# Patient Record
Sex: Female | Born: 1940 | Race: Black or African American | Hispanic: No | Smoking: Never smoker
Health system: Southern US, Community
[De-identification: ages and names within clinical notes are randomized; demographics above are authoritative.]

## PROBLEM LIST (undated history)

## (undated) DIAGNOSIS — F329 Major depressive disorder, single episode, unspecified: Secondary | ICD-10-CM

## (undated) DIAGNOSIS — I10 Essential (primary) hypertension: Secondary | ICD-10-CM

## (undated) DIAGNOSIS — K219 Gastro-esophageal reflux disease without esophagitis: Secondary | ICD-10-CM

## (undated) DIAGNOSIS — C569 Malignant neoplasm of unspecified ovary: Secondary | ICD-10-CM

## (undated) DIAGNOSIS — F32A Depression, unspecified: Secondary | ICD-10-CM

## (undated) DIAGNOSIS — Z9889 Other specified postprocedural states: Secondary | ICD-10-CM

## (undated) DIAGNOSIS — E785 Hyperlipidemia, unspecified: Secondary | ICD-10-CM

## (undated) DIAGNOSIS — Z8719 Personal history of other diseases of the digestive system: Secondary | ICD-10-CM

## (undated) DIAGNOSIS — F419 Anxiety disorder, unspecified: Secondary | ICD-10-CM

## (undated) HISTORY — DX: Anxiety disorder, unspecified: F41.9

## (undated) HISTORY — DX: Other specified postprocedural states: Z98.890

## (undated) HISTORY — DX: Major depressive disorder, single episode, unspecified: F32.9

## (undated) HISTORY — DX: Hyperlipidemia, unspecified: E78.5

## (undated) HISTORY — DX: Malignant neoplasm of unspecified ovary: C56.9

## (undated) HISTORY — PX: CHOLECYSTECTOMY: SHX55

## (undated) HISTORY — DX: Personal history of other diseases of the digestive system: Z87.19

## (undated) HISTORY — DX: Depression, unspecified: F32.A

## (undated) HISTORY — DX: Essential (primary) hypertension: I10

---

## 1961-03-23 HISTORY — PX: TUBAL LIGATION: SHX77

## 1978-03-23 HISTORY — PX: VAGINAL HYSTERECTOMY: SUR661

## 1997-05-31 ENCOUNTER — Ambulatory Visit (HOSPITAL_COMMUNITY): Admission: RE | Admit: 1997-05-31 | Discharge: 1997-05-31 | Payer: Self-pay | Admitting: *Deleted

## 1997-07-20 ENCOUNTER — Ambulatory Visit (HOSPITAL_COMMUNITY): Admission: RE | Admit: 1997-07-20 | Discharge: 1997-07-20 | Payer: Self-pay | Admitting: *Deleted

## 1997-11-16 ENCOUNTER — Ambulatory Visit (HOSPITAL_COMMUNITY): Admission: RE | Admit: 1997-11-16 | Discharge: 1997-11-16 | Payer: Self-pay | Admitting: Psychiatry

## 1997-12-20 ENCOUNTER — Ambulatory Visit (HOSPITAL_COMMUNITY): Admission: RE | Admit: 1997-12-20 | Discharge: 1997-12-20 | Payer: Self-pay | Admitting: Obstetrics and Gynecology

## 1997-12-20 ENCOUNTER — Encounter: Payer: Self-pay | Admitting: Obstetrics and Gynecology

## 1998-01-05 ENCOUNTER — Emergency Department (HOSPITAL_COMMUNITY): Admission: EM | Admit: 1998-01-05 | Discharge: 1998-01-05 | Payer: Self-pay | Admitting: Emergency Medicine

## 1998-01-05 ENCOUNTER — Encounter: Payer: Self-pay | Admitting: Emergency Medicine

## 1998-01-21 ENCOUNTER — Ambulatory Visit (HOSPITAL_COMMUNITY): Admission: RE | Admit: 1998-01-21 | Discharge: 1998-01-21 | Payer: Self-pay | Admitting: *Deleted

## 1998-05-09 ENCOUNTER — Emergency Department (HOSPITAL_COMMUNITY): Admission: EM | Admit: 1998-05-09 | Discharge: 1998-05-09 | Payer: Self-pay | Admitting: Emergency Medicine

## 1998-05-10 ENCOUNTER — Ambulatory Visit (HOSPITAL_COMMUNITY): Admission: RE | Admit: 1998-05-10 | Discharge: 1998-05-10 | Payer: Self-pay | Admitting: Psychiatry

## 1998-05-24 ENCOUNTER — Ambulatory Visit (HOSPITAL_COMMUNITY): Admission: RE | Admit: 1998-05-24 | Discharge: 1998-05-24 | Payer: Self-pay | Admitting: Psychiatry

## 1998-09-06 ENCOUNTER — Ambulatory Visit (HOSPITAL_COMMUNITY): Admission: RE | Admit: 1998-09-06 | Discharge: 1998-09-06 | Payer: Self-pay | Admitting: *Deleted

## 1998-12-23 ENCOUNTER — Encounter: Payer: Self-pay | Admitting: Family Medicine

## 1998-12-23 ENCOUNTER — Ambulatory Visit (HOSPITAL_COMMUNITY): Admission: RE | Admit: 1998-12-23 | Discharge: 1998-12-23 | Payer: Self-pay | Admitting: Family Medicine

## 1999-07-20 ENCOUNTER — Emergency Department (HOSPITAL_COMMUNITY): Admission: EM | Admit: 1999-07-20 | Discharge: 1999-07-20 | Payer: Self-pay | Admitting: Emergency Medicine

## 1999-07-20 ENCOUNTER — Encounter: Payer: Self-pay | Admitting: Emergency Medicine

## 1999-10-07 ENCOUNTER — Encounter: Payer: Self-pay | Admitting: Emergency Medicine

## 1999-10-08 ENCOUNTER — Inpatient Hospital Stay (HOSPITAL_COMMUNITY): Admission: EM | Admit: 1999-10-08 | Discharge: 1999-10-13 | Payer: Self-pay | Admitting: Emergency Medicine

## 1999-10-08 ENCOUNTER — Encounter: Payer: Self-pay | Admitting: Family Medicine

## 1999-12-12 ENCOUNTER — Ambulatory Visit (HOSPITAL_COMMUNITY): Admission: RE | Admit: 1999-12-12 | Discharge: 1999-12-12 | Payer: Self-pay | Admitting: Family Medicine

## 1999-12-12 ENCOUNTER — Encounter: Payer: Self-pay | Admitting: Family Medicine

## 2000-12-13 ENCOUNTER — Ambulatory Visit (HOSPITAL_COMMUNITY): Admission: RE | Admit: 2000-12-13 | Discharge: 2000-12-13 | Payer: Self-pay | Admitting: Family Medicine

## 2000-12-13 ENCOUNTER — Encounter: Payer: Self-pay | Admitting: Family Medicine

## 2001-04-14 ENCOUNTER — Emergency Department (HOSPITAL_COMMUNITY): Admission: EM | Admit: 2001-04-14 | Discharge: 2001-04-15 | Payer: Self-pay

## 2002-01-09 ENCOUNTER — Encounter: Payer: Self-pay | Admitting: Family Medicine

## 2002-01-09 ENCOUNTER — Ambulatory Visit (HOSPITAL_COMMUNITY): Admission: RE | Admit: 2002-01-09 | Discharge: 2002-01-09 | Payer: Self-pay | Admitting: Family Medicine

## 2002-09-11 ENCOUNTER — Inpatient Hospital Stay (HOSPITAL_COMMUNITY): Admission: EM | Admit: 2002-09-11 | Discharge: 2002-09-19 | Payer: Self-pay | Admitting: Emergency Medicine

## 2002-09-11 ENCOUNTER — Encounter: Payer: Self-pay | Admitting: Emergency Medicine

## 2002-09-15 ENCOUNTER — Encounter: Payer: Self-pay | Admitting: Family Medicine

## 2003-01-15 ENCOUNTER — Encounter: Payer: Self-pay | Admitting: Family Medicine

## 2003-01-15 ENCOUNTER — Ambulatory Visit (HOSPITAL_COMMUNITY): Admission: RE | Admit: 2003-01-15 | Discharge: 2003-01-15 | Payer: Self-pay | Admitting: Family Medicine

## 2003-11-26 ENCOUNTER — Emergency Department (HOSPITAL_COMMUNITY): Admission: EM | Admit: 2003-11-26 | Discharge: 2003-11-26 | Payer: Self-pay | Admitting: Emergency Medicine

## 2004-01-03 ENCOUNTER — Observation Stay (HOSPITAL_COMMUNITY): Admission: RE | Admit: 2004-01-03 | Discharge: 2004-01-04 | Payer: Self-pay | Admitting: General Surgery

## 2004-01-03 ENCOUNTER — Encounter (INDEPENDENT_AMBULATORY_CARE_PROVIDER_SITE_OTHER): Payer: Self-pay | Admitting: Specialist

## 2004-02-12 ENCOUNTER — Ambulatory Visit (HOSPITAL_COMMUNITY): Admission: RE | Admit: 2004-02-12 | Discharge: 2004-02-12 | Payer: Self-pay | Admitting: Obstetrics and Gynecology

## 2005-03-02 ENCOUNTER — Ambulatory Visit (HOSPITAL_COMMUNITY): Admission: RE | Admit: 2005-03-02 | Discharge: 2005-03-02 | Payer: Self-pay | Admitting: Family Medicine

## 2006-03-03 ENCOUNTER — Ambulatory Visit (HOSPITAL_COMMUNITY): Admission: RE | Admit: 2006-03-03 | Discharge: 2006-03-03 | Payer: Self-pay | Admitting: Family Medicine

## 2007-03-07 ENCOUNTER — Ambulatory Visit (HOSPITAL_COMMUNITY): Admission: RE | Admit: 2007-03-07 | Discharge: 2007-03-07 | Payer: Self-pay | Admitting: Family Medicine

## 2008-03-12 ENCOUNTER — Ambulatory Visit (HOSPITAL_COMMUNITY): Admission: RE | Admit: 2008-03-12 | Discharge: 2008-03-12 | Payer: Self-pay | Admitting: Family Medicine

## 2009-02-06 ENCOUNTER — Encounter: Admission: RE | Admit: 2009-02-06 | Discharge: 2009-02-06 | Payer: Self-pay | Admitting: Family Medicine

## 2009-03-20 ENCOUNTER — Ambulatory Visit: Admission: RE | Admit: 2009-03-20 | Discharge: 2009-03-20 | Payer: Self-pay | Admitting: Gynecologic Oncology

## 2009-04-01 ENCOUNTER — Inpatient Hospital Stay (HOSPITAL_COMMUNITY): Admission: RE | Admit: 2009-04-01 | Discharge: 2009-04-15 | Payer: Self-pay | Admitting: Radiation Oncology

## 2009-04-02 ENCOUNTER — Encounter: Payer: Self-pay | Admitting: Obstetrics & Gynecology

## 2009-04-02 DIAGNOSIS — C569 Malignant neoplasm of unspecified ovary: Secondary | ICD-10-CM

## 2009-04-02 HISTORY — DX: Malignant neoplasm of unspecified ovary: C56.9

## 2009-04-08 ENCOUNTER — Ambulatory Visit: Payer: Self-pay | Admitting: Oncology

## 2009-04-17 ENCOUNTER — Ambulatory Visit: Admission: RE | Admit: 2009-04-17 | Discharge: 2009-04-17 | Payer: Self-pay | Admitting: Gynecologic Oncology

## 2009-04-30 LAB — CBC WITH DIFFERENTIAL/PLATELET
Eosinophils Absolute: 0.3 10*3/uL (ref 0.0–0.5)
HCT: 31.3 % — ABNORMAL LOW (ref 34.8–46.6)
LYMPH%: 39.6 % (ref 14.0–49.7)
MCV: 82.8 fL (ref 79.5–101.0)
MONO#: 0.6 10*3/uL (ref 0.1–0.9)
MONO%: 6.8 % (ref 0.0–14.0)
NEUT#: 4.5 10*3/uL (ref 1.5–6.5)
NEUT%: 49.5 % (ref 38.4–76.8)
Platelets: 388 10*3/uL (ref 145–400)
RBC: 3.79 10*6/uL (ref 3.70–5.45)
WBC: 9.1 10*3/uL (ref 3.9–10.3)

## 2009-04-30 LAB — COMPREHENSIVE METABOLIC PANEL
ALT: 11 U/L (ref 0–35)
AST: 15 U/L (ref 0–37)
Albumin: 3.4 g/dL — ABNORMAL LOW (ref 3.5–5.2)
Alkaline Phosphatase: 40 U/L (ref 39–117)
Glucose, Bld: 87 mg/dL (ref 70–99)
Potassium: 4.1 mEq/L (ref 3.5–5.3)
Sodium: 136 mEq/L (ref 135–145)
Total Bilirubin: 0.4 mg/dL (ref 0.3–1.2)
Total Protein: 8.1 g/dL (ref 6.0–8.3)

## 2009-05-07 ENCOUNTER — Ambulatory Visit: Payer: Self-pay | Admitting: Oncology

## 2009-05-08 ENCOUNTER — Ambulatory Visit (HOSPITAL_COMMUNITY): Admission: RE | Admit: 2009-05-08 | Discharge: 2009-05-08 | Payer: Self-pay | Admitting: Oncology

## 2009-05-10 LAB — CBC WITH DIFFERENTIAL/PLATELET
BASO%: 0.2 % (ref 0.0–2.0)
HCT: 30.3 % — ABNORMAL LOW (ref 34.8–46.6)
LYMPH%: 38.3 % (ref 14.0–49.7)
MCHC: 32.7 g/dL (ref 31.5–36.0)
MCV: 83.2 fL (ref 79.5–101.0)
MONO#: 0.5 10*3/uL (ref 0.1–0.9)
MONO%: 5.7 % (ref 0.0–14.0)
NEUT%: 51.8 % (ref 38.4–76.8)
Platelets: 234 10*3/uL (ref 145–400)
RBC: 3.64 10*6/uL — ABNORMAL LOW (ref 3.70–5.45)

## 2009-05-17 LAB — CBC WITH DIFFERENTIAL/PLATELET
BASO%: 0.3 % (ref 0.0–2.0)
EOS%: 2.4 % (ref 0.0–7.0)
HCT: 30.3 % — ABNORMAL LOW (ref 34.8–46.6)
LYMPH%: 42.3 % (ref 14.0–49.7)
MCH: 27.2 pg (ref 25.1–34.0)
MCHC: 32.7 g/dL (ref 31.5–36.0)
MONO#: 0.3 10*3/uL (ref 0.1–0.9)
NEUT%: 50.8 % (ref 38.4–76.8)
Platelets: 238 10*3/uL (ref 145–400)
RBC: 3.64 10*6/uL — ABNORMAL LOW (ref 3.70–5.45)
WBC: 7.6 10*3/uL (ref 3.9–10.3)
lymph#: 3.2 10*3/uL (ref 0.9–3.3)
nRBC: 0 % (ref 0–0)

## 2009-05-24 LAB — CBC WITH DIFFERENTIAL/PLATELET
BASO%: 0.3 % (ref 0.0–2.0)
EOS%: 2 % (ref 0.0–7.0)
HCT: 28.8 % — ABNORMAL LOW (ref 34.8–46.6)
LYMPH%: 45.7 % (ref 14.0–49.7)
MCH: 27.2 pg (ref 25.1–34.0)
MCHC: 32.3 g/dL (ref 31.5–36.0)
MONO%: 7.9 % (ref 0.0–14.0)
NEUT%: 44.1 % (ref 38.4–76.8)
Platelets: 135 10*3/uL — ABNORMAL LOW (ref 145–400)

## 2009-05-29 LAB — IRON AND TIBC
%SAT: 21 % (ref 20–55)
UIBC: 183 ug/dL

## 2009-05-29 LAB — CBC WITH DIFFERENTIAL/PLATELET
Basophils Absolute: 0 10*3/uL (ref 0.0–0.1)
EOS%: 3 % (ref 0.0–7.0)
MCH: 29.2 pg (ref 25.1–34.0)
MCV: 84.8 fL (ref 79.5–101.0)
MONO%: 7.4 % (ref 0.0–14.0)
RBC: 3.07 10*6/uL — ABNORMAL LOW (ref 3.70–5.45)
RDW: 19.2 % — ABNORMAL HIGH (ref 11.2–14.5)

## 2009-05-29 LAB — COMPREHENSIVE METABOLIC PANEL
AST: 20 U/L (ref 0–37)
Albumin: 3.3 g/dL — ABNORMAL LOW (ref 3.5–5.2)
Alkaline Phosphatase: 47 U/L (ref 39–117)
BUN: 15 mg/dL (ref 6–23)
Potassium: 3.9 mEq/L (ref 3.5–5.3)
Sodium: 134 mEq/L — ABNORMAL LOW (ref 135–145)

## 2009-05-29 LAB — FERRITIN: Ferritin: 653 ng/mL — ABNORMAL HIGH (ref 10–291)

## 2009-06-05 ENCOUNTER — Ambulatory Visit: Payer: Self-pay | Admitting: Oncology

## 2009-06-07 LAB — CBC WITH DIFFERENTIAL/PLATELET
BASO%: 0.4 % (ref 0.0–2.0)
HCT: 26.8 % — ABNORMAL LOW (ref 34.8–46.6)
MCHC: 34.8 g/dL (ref 31.5–36.0)
MONO#: 0 10*3/uL — ABNORMAL LOW (ref 0.1–0.9)
NEUT%: 46.1 % (ref 38.4–76.8)
RBC: 3.13 10*6/uL — ABNORMAL LOW (ref 3.70–5.45)
RDW: 19.2 % — ABNORMAL HIGH (ref 11.2–14.5)
WBC: 3.5 10*3/uL — ABNORMAL LOW (ref 3.9–10.3)
lymph#: 1.8 10*3/uL (ref 0.9–3.3)

## 2009-06-14 LAB — CBC WITH DIFFERENTIAL/PLATELET
Basophils Absolute: 0 10*3/uL (ref 0.0–0.1)
Eosinophils Absolute: 0 10*3/uL (ref 0.0–0.5)
HCT: 25.9 % — ABNORMAL LOW (ref 34.8–46.6)
HGB: 8.5 g/dL — ABNORMAL LOW (ref 11.6–15.9)
MONO#: 0.5 10*3/uL (ref 0.1–0.9)
NEUT#: 0.6 10*3/uL — ABNORMAL LOW (ref 1.5–6.5)
NEUT%: 16.8 % — ABNORMAL LOW (ref 38.4–76.8)
RDW: 18.5 % — ABNORMAL HIGH (ref 11.2–14.5)
WBC: 3.7 10*3/uL — ABNORMAL LOW (ref 3.9–10.3)
lymph#: 2.5 10*3/uL (ref 0.9–3.3)

## 2009-06-17 LAB — CBC WITH DIFFERENTIAL/PLATELET
Basophils Absolute: 0 10*3/uL (ref 0.0–0.1)
EOS%: 0.2 % (ref 0.0–7.0)
Eosinophils Absolute: 0.1 10*3/uL (ref 0.0–0.5)
HCT: 26.5 % — ABNORMAL LOW (ref 34.8–46.6)
HGB: 8.8 g/dL — ABNORMAL LOW (ref 11.6–15.9)
MCH: 28.9 pg (ref 25.1–34.0)
MCV: 87.2 fL (ref 79.5–101.0)
MONO%: 6 % (ref 0.0–14.0)
NEUT#: 18.1 10*3/uL — ABNORMAL HIGH (ref 1.5–6.5)
NEUT%: 74.6 % (ref 38.4–76.8)
lymph#: 4.6 10*3/uL — ABNORMAL HIGH (ref 0.9–3.3)

## 2009-06-28 LAB — COMPREHENSIVE METABOLIC PANEL
Albumin: 4 g/dL (ref 3.5–5.2)
BUN: 29 mg/dL — ABNORMAL HIGH (ref 6–23)
CO2: 20 mEq/L (ref 19–32)
Calcium: 9.6 mg/dL (ref 8.4–10.5)
Chloride: 103 mEq/L (ref 96–112)
Creatinine, Ser: 0.88 mg/dL (ref 0.40–1.20)
Glucose, Bld: 184 mg/dL — ABNORMAL HIGH (ref 70–99)
Potassium: 4.7 mEq/L (ref 3.5–5.3)

## 2009-06-28 LAB — CBC WITH DIFFERENTIAL/PLATELET
Basophils Absolute: 0 10*3/uL (ref 0.0–0.1)
HCT: 27.7 % — ABNORMAL LOW (ref 34.8–46.6)
HGB: 9.3 g/dL — ABNORMAL LOW (ref 11.6–15.9)
LYMPH%: 13.2 % — ABNORMAL LOW (ref 14.0–49.7)
MCH: 29.3 pg (ref 25.1–34.0)
MONO#: 0 10*3/uL — ABNORMAL LOW (ref 0.1–0.9)
NEUT%: 86.1 % — ABNORMAL HIGH (ref 38.4–76.8)
Platelets: 444 10*3/uL — ABNORMAL HIGH (ref 145–400)
WBC: 7.2 10*3/uL (ref 3.9–10.3)
lymph#: 1 10*3/uL (ref 0.9–3.3)

## 2009-06-28 LAB — CA 125: CA 125: 121.2 U/mL — ABNORMAL HIGH (ref 0.0–30.2)

## 2009-07-05 ENCOUNTER — Ambulatory Visit: Payer: Self-pay | Admitting: Oncology

## 2009-07-09 LAB — CBC WITH DIFFERENTIAL/PLATELET
Eosinophils Absolute: 0 10*3/uL (ref 0.0–0.5)
MCV: 90.1 fL (ref 79.5–101.0)
MONO#: 1 10*3/uL — ABNORMAL HIGH (ref 0.1–0.9)
MONO%: 5.7 % (ref 0.0–14.0)
NEUT#: 12 10*3/uL — ABNORMAL HIGH (ref 1.5–6.5)
RBC: 2.93 10*6/uL — ABNORMAL LOW (ref 3.70–5.45)
RDW: 20.5 % — ABNORMAL HIGH (ref 11.2–14.5)
WBC: 16.7 10*3/uL — ABNORMAL HIGH (ref 3.9–10.3)
lymph#: 3.6 10*3/uL — ABNORMAL HIGH (ref 0.9–3.3)
nRBC: 0 % (ref 0–0)

## 2009-07-19 LAB — CBC WITH DIFFERENTIAL/PLATELET
BASO%: 0 % (ref 0.0–2.0)
HCT: 26.2 % — ABNORMAL LOW (ref 34.8–46.6)
MCHC: 33.2 g/dL (ref 31.5–36.0)
MONO#: 0.1 10*3/uL (ref 0.1–0.9)
NEUT#: 7.1 10*3/uL — ABNORMAL HIGH (ref 1.5–6.5)
NEUT%: 86.4 % — ABNORMAL HIGH (ref 38.4–76.8)
WBC: 8.2 10*3/uL (ref 3.9–10.3)
lymph#: 1.1 10*3/uL (ref 0.9–3.3)

## 2009-07-19 LAB — COMPREHENSIVE METABOLIC PANEL
ALT: 10 U/L (ref 0–35)
CO2: 19 mEq/L (ref 19–32)
Calcium: 9.5 mg/dL (ref 8.4–10.5)
Chloride: 103 mEq/L (ref 96–112)
Creatinine, Ser: 0.87 mg/dL (ref 0.40–1.20)
Sodium: 134 mEq/L — ABNORMAL LOW (ref 135–145)
Total Protein: 7.9 g/dL (ref 6.0–8.3)

## 2009-07-19 LAB — CA 125: CA 125: 100.7 U/mL — ABNORMAL HIGH (ref 0.0–30.2)

## 2009-08-05 ENCOUNTER — Ambulatory Visit: Payer: Self-pay | Admitting: Oncology

## 2009-08-05 ENCOUNTER — Encounter (HOSPITAL_COMMUNITY): Admission: RE | Admit: 2009-08-05 | Discharge: 2009-09-06 | Payer: Self-pay | Admitting: Oncology

## 2009-08-05 LAB — CBC WITH DIFFERENTIAL/PLATELET
Basophils Absolute: 0 10*3/uL (ref 0.0–0.1)
Eosinophils Absolute: 0.1 10*3/uL (ref 0.0–0.5)
HCT: 23.9 % — ABNORMAL LOW (ref 34.8–46.6)
HGB: 7.9 g/dL — ABNORMAL LOW (ref 11.6–15.9)
LYMPH%: 29.2 % (ref 14.0–49.7)
MCV: 95.2 fL (ref 79.5–101.0)
MONO#: 0.5 10*3/uL (ref 0.1–0.9)
MONO%: 6.5 % (ref 0.0–14.0)
NEUT#: 5.1 10*3/uL (ref 1.5–6.5)
NEUT%: 63.5 % (ref 38.4–76.8)
Platelets: 153 10*3/uL (ref 145–400)
WBC: 8.1 10*3/uL (ref 3.9–10.3)
nRBC: 0 % (ref 0–0)

## 2009-08-09 LAB — CBC WITH DIFFERENTIAL/PLATELET
Basophils Absolute: 0 10*3/uL (ref 0.0–0.1)
Eosinophils Absolute: 0 10*3/uL (ref 0.0–0.5)
HGB: 10.6 g/dL — ABNORMAL LOW (ref 11.6–15.9)
LYMPH%: 14.6 % (ref 14.0–49.7)
MCV: 92.2 fL (ref 79.5–101.0)
MONO#: 0 10*3/uL — ABNORMAL LOW (ref 0.1–0.9)
MONO%: 0.3 % (ref 0.0–14.0)
NEUT#: 5.2 10*3/uL (ref 1.5–6.5)
Platelets: 184 10*3/uL (ref 145–400)
RBC: 3.45 10*6/uL — ABNORMAL LOW (ref 3.70–5.45)
WBC: 6.1 10*3/uL (ref 3.9–10.3)

## 2009-08-09 LAB — FECAL OCCULT BLOOD, GUAIAC: Occult Blood: NEGATIVE

## 2009-08-26 LAB — CBC WITH DIFFERENTIAL/PLATELET
BASO%: 0.3 % (ref 0.0–2.0)
Eosinophils Absolute: 0.1 10*3/uL (ref 0.0–0.5)
LYMPH%: 21.8 % (ref 14.0–49.7)
MCHC: 34.9 g/dL (ref 31.5–36.0)
MONO#: 0.8 10*3/uL (ref 0.1–0.9)
MONO%: 7.1 % (ref 0.0–14.0)
NEUT#: 8.2 10*3/uL — ABNORMAL HIGH (ref 1.5–6.5)
RBC: 2.72 10*6/uL — ABNORMAL LOW (ref 3.70–5.45)
RDW: 21.3 % — ABNORMAL HIGH (ref 11.2–14.5)
WBC: 11.6 10*3/uL — ABNORMAL HIGH (ref 3.9–10.3)

## 2009-09-03 LAB — CBC WITH DIFFERENTIAL/PLATELET
BASO%: 0.1 % (ref 0.0–2.0)
Eosinophils Absolute: 0.3 10*3/uL (ref 0.0–0.5)
HCT: 27 % — ABNORMAL LOW (ref 34.8–46.6)
LYMPH%: 21.9 % (ref 14.0–49.7)
MCHC: 31.9 g/dL (ref 31.5–36.0)
MONO#: 0.6 10*3/uL (ref 0.1–0.9)
NEUT#: 5.3 10*3/uL (ref 1.5–6.5)
Platelets: 182 10*3/uL (ref 145–400)
RBC: 2.8 10*6/uL — ABNORMAL LOW (ref 3.70–5.45)
WBC: 8 10*3/uL (ref 3.9–10.3)
lymph#: 1.7 10*3/uL (ref 0.9–3.3)
nRBC: 0 % (ref 0–0)

## 2009-09-04 ENCOUNTER — Ambulatory Visit: Payer: Self-pay | Admitting: Oncology

## 2009-09-06 LAB — COMPREHENSIVE METABOLIC PANEL
ALT: 13 U/L (ref 0–35)
AST: 18 U/L (ref 0–37)
Albumin: 3.9 g/dL (ref 3.5–5.2)
CO2: 21 mEq/L (ref 19–32)
Calcium: 9.4 mg/dL (ref 8.4–10.5)
Chloride: 106 mEq/L (ref 96–112)
Potassium: 4.4 mEq/L (ref 3.5–5.3)
Total Protein: 7.6 g/dL (ref 6.0–8.3)

## 2009-09-06 LAB — CA 125: CA 125: 73 U/mL — ABNORMAL HIGH (ref 0.0–30.2)

## 2009-09-20 LAB — CBC WITH DIFFERENTIAL/PLATELET
BASO%: 0.3 % (ref 0.0–2.0)
LYMPH%: 22.5 % (ref 14.0–49.7)
MCHC: 34.4 g/dL (ref 31.5–36.0)
MCV: 95 fL (ref 79.5–101.0)
MONO%: 5.4 % (ref 0.0–14.0)
Platelets: 143 10*3/uL — ABNORMAL LOW (ref 145–400)
RBC: 2.85 10*6/uL — ABNORMAL LOW (ref 3.70–5.45)
RDW: 19.1 % — ABNORMAL HIGH (ref 11.2–14.5)
WBC: 7.9 10*3/uL (ref 3.9–10.3)

## 2009-09-24 ENCOUNTER — Ambulatory Visit (HOSPITAL_COMMUNITY): Admission: RE | Admit: 2009-09-24 | Discharge: 2009-09-24 | Payer: Self-pay | Admitting: Oncology

## 2009-09-26 ENCOUNTER — Ambulatory Visit: Admission: RE | Admit: 2009-09-26 | Discharge: 2009-09-26 | Payer: Self-pay | Admitting: Gynecologic Oncology

## 2009-10-04 ENCOUNTER — Ambulatory Visit: Payer: Self-pay | Admitting: Oncology

## 2009-10-04 LAB — CBC WITH DIFFERENTIAL/PLATELET
BASO%: 1.5 % (ref 0.0–2.0)
LYMPH%: 34.3 % (ref 14.0–49.7)
MCHC: 33.2 g/dL (ref 31.5–36.0)
MONO#: 0.5 10*3/uL (ref 0.1–0.9)
NEUT#: 3.5 10*3/uL (ref 1.5–6.5)
RBC: 3 10*6/uL — ABNORMAL LOW (ref 3.70–5.45)
RDW: 19.1 % — ABNORMAL HIGH (ref 11.2–14.5)
WBC: 6.5 10*3/uL (ref 3.9–10.3)
lymph#: 2.2 10*3/uL (ref 0.9–3.3)

## 2009-10-11 LAB — CBC & DIFF AND RETIC
Basophils Absolute: 0 10*3/uL (ref 0.0–0.1)
Eosinophils Absolute: 0 10*3/uL (ref 0.0–0.5)
HGB: 10 g/dL — ABNORMAL LOW (ref 11.6–15.9)
Immature Retic Fract: 7.3 % (ref 0.00–10.70)
MCV: 95.8 fL (ref 79.5–101.0)
MONO#: 0 10*3/uL — ABNORMAL LOW (ref 0.1–0.9)
NEUT#: 4.9 10*3/uL (ref 1.5–6.5)
RBC: 3.12 10*6/uL — ABNORMAL LOW (ref 3.70–5.45)
RDW: 16.2 % — ABNORMAL HIGH (ref 11.2–14.5)
Retic %: 1.55 % — ABNORMAL HIGH (ref 0.50–1.50)
Retic Ct Abs: 48.36 10*3/uL (ref 18.30–72.70)
WBC: 6.1 10*3/uL (ref 3.9–10.3)
lymph#: 1.2 10*3/uL (ref 0.9–3.3)
nRBC: 0 % (ref 0–0)

## 2009-10-16 LAB — COMPREHENSIVE METABOLIC PANEL
ALT: 9 U/L (ref 0–35)
Albumin: 4.3 g/dL (ref 3.5–5.2)
CO2: 19 mEq/L (ref 19–32)
Chloride: 103 mEq/L (ref 96–112)
Potassium: 4.1 mEq/L (ref 3.5–5.3)
Sodium: 135 mEq/L (ref 135–145)
Total Bilirubin: 0.3 mg/dL (ref 0.3–1.2)
Total Protein: 8.4 g/dL — ABNORMAL HIGH (ref 6.0–8.3)

## 2009-10-16 LAB — VITAMIN B12: Vitamin B-12: 888 pg/mL (ref 211–911)

## 2009-10-16 LAB — IRON AND TIBC: UIBC: 239 ug/dL

## 2009-10-16 LAB — FOLATE RBC: RBC Folate: 1011 ng/mL — ABNORMAL HIGH (ref 180–600)

## 2009-10-28 LAB — CBC WITH DIFFERENTIAL/PLATELET
Basophils Absolute: 0 10*3/uL (ref 0.0–0.1)
Eosinophils Absolute: 0.1 10*3/uL (ref 0.0–0.5)
HCT: 25.7 % — ABNORMAL LOW (ref 34.8–46.6)
LYMPH%: 23 % (ref 14.0–49.7)
MCV: 97.5 fL (ref 79.5–101.0)
MONO#: 0.5 10*3/uL (ref 0.1–0.9)
MONO%: 6 % (ref 0.0–14.0)
NEUT#: 6.3 10*3/uL (ref 1.5–6.5)
NEUT%: 69.9 % (ref 38.4–76.8)
Platelets: 177 10*3/uL (ref 145–400)
RBC: 2.64 10*6/uL — ABNORMAL LOW (ref 3.70–5.45)
WBC: 9 10*3/uL (ref 3.9–10.3)

## 2009-10-28 LAB — COMPREHENSIVE METABOLIC PANEL
Alkaline Phosphatase: 64 U/L (ref 39–117)
BUN: 25 mg/dL — ABNORMAL HIGH (ref 6–23)
CO2: 23 mEq/L (ref 19–32)
Creatinine, Ser: 0.84 mg/dL (ref 0.40–1.20)
Glucose, Bld: 98 mg/dL (ref 70–99)
Sodium: 140 mEq/L (ref 135–145)
Total Bilirubin: 0.2 mg/dL — ABNORMAL LOW (ref 0.3–1.2)
Total Protein: 7.5 g/dL (ref 6.0–8.3)

## 2009-10-28 LAB — LIPID PANEL
Cholesterol: 200 mg/dL (ref 0–200)
HDL: 41 mg/dL (ref >39)
LDL Cholesterol: 105 mg/dL — ABNORMAL HIGH (ref 0–99)
Triglycerides: 270 mg/dL — ABNORMAL HIGH (ref <150)
VLDL: 54 mg/dL — ABNORMAL HIGH (ref 0–40)

## 2009-11-04 ENCOUNTER — Ambulatory Visit (HOSPITAL_COMMUNITY): Admission: RE | Admit: 2009-11-04 | Discharge: 2009-11-04 | Payer: Self-pay | Admitting: Oncology

## 2009-11-06 ENCOUNTER — Ambulatory Visit: Payer: Self-pay | Admitting: Oncology

## 2009-11-08 LAB — CBC WITH DIFFERENTIAL/PLATELET
BASO%: 0.3 % (ref 0.0–2.0)
Basophils Absolute: 0 10*3/uL (ref 0.0–0.1)
EOS%: 0 % (ref 0.0–7.0)
HGB: 9.5 g/dL — ABNORMAL LOW (ref 11.6–15.9)
MCH: 32.5 pg (ref 25.1–34.0)
MCHC: 33.2 g/dL (ref 31.5–36.0)
MCV: 97.9 fL (ref 79.5–101.0)
MONO%: 0.5 % (ref 0.0–14.0)
NEUT%: 85.3 % — ABNORMAL HIGH (ref 38.4–76.8)
RDW: 15.7 % — ABNORMAL HIGH (ref 11.2–14.5)
lymph#: 1 10*3/uL (ref 0.9–3.3)

## 2009-11-26 LAB — CBC WITH DIFFERENTIAL/PLATELET
BASO%: 0.2 % (ref 0.0–2.0)
Basophils Absolute: 0 10*3/uL (ref 0.0–0.1)
EOS%: 1.2 % (ref 0.0–7.0)
Eosinophils Absolute: 0.1 10*3/uL (ref 0.0–0.5)
HCT: 26.7 % — ABNORMAL LOW (ref 34.8–46.6)
HGB: 8.6 g/dL — ABNORMAL LOW (ref 11.6–15.9)
LYMPH%: 28 % (ref 14.0–49.7)
MCH: 32.5 pg (ref 25.1–34.0)
MCHC: 32.2 g/dL (ref 31.5–36.0)
MCV: 100.8 fL (ref 79.5–101.0)
MONO#: 0.6 10*3/uL (ref 0.1–0.9)
MONO%: 7 % (ref 0.0–14.0)
NEUT#: 5.7 10*3/uL (ref 1.5–6.5)
NEUT%: 63.6 % (ref 38.4–76.8)
Platelets: 148 10*3/uL (ref 145–400)
RBC: 2.65 10*6/uL — ABNORMAL LOW (ref 3.70–5.45)
RDW: 15.5 % — ABNORMAL HIGH (ref 11.2–14.5)
WBC: 8.9 10*3/uL (ref 3.9–10.3)
lymph#: 2.5 10*3/uL (ref 0.9–3.3)
nRBC: 0 % (ref 0–0)

## 2009-12-06 ENCOUNTER — Ambulatory Visit: Payer: Self-pay | Admitting: Oncology

## 2009-12-10 LAB — CBC & DIFF AND RETIC
BASO%: 0.2 % (ref 0.0–2.0)
Basophils Absolute: 0 10*3/uL (ref 0.0–0.1)
EOS%: 3 % (ref 0.0–7.0)
HCT: 28 % — ABNORMAL LOW (ref 34.8–46.6)
HGB: 9.3 g/dL — ABNORMAL LOW (ref 11.6–15.9)
Immature Retic Fract: 8.2 % (ref 0.00–10.70)
LYMPH%: 28.4 % (ref 14.0–49.7)
MCH: 33.1 pg (ref 25.1–34.0)
MCHC: 33.2 g/dL (ref 31.5–36.0)
MONO#: 0.5 10*3/uL (ref 0.1–0.9)
NEUT%: 58.9 % (ref 38.4–76.8)
Platelets: 219 10*3/uL (ref 145–400)

## 2009-12-11 LAB — BASIC METABOLIC PANEL
CO2: 25 mEq/L (ref 19–32)
Calcium: 9.5 mg/dL (ref 8.4–10.5)
Chloride: 106 mEq/L (ref 96–112)
Glucose, Bld: 84 mg/dL (ref 70–99)
Sodium: 140 mEq/L (ref 135–145)

## 2010-01-07 ENCOUNTER — Ambulatory Visit: Payer: Self-pay | Admitting: Oncology

## 2010-01-09 ENCOUNTER — Ambulatory Visit: Admission: RE | Admit: 2010-01-09 | Discharge: 2010-01-09 | Payer: Self-pay | Admitting: Gynecologic Oncology

## 2010-01-09 LAB — CBC WITH DIFFERENTIAL/PLATELET
BASO%: 0.2 % (ref 0.0–2.0)
Basophils Absolute: 0 10*3/uL (ref 0.0–0.1)
EOS%: 3.4 % (ref 0.0–7.0)
Eosinophils Absolute: 0.2 10*3/uL (ref 0.0–0.5)
HCT: 31.8 % — ABNORMAL LOW (ref 34.8–46.6)
HGB: 10.5 g/dL — ABNORMAL LOW (ref 11.6–15.9)
LYMPH%: 36.9 % (ref 14.0–49.7)
MCH: 31.5 pg (ref 25.1–34.0)
MCHC: 33 g/dL (ref 31.5–36.0)
MCV: 95.5 fL (ref 79.5–101.0)
MONO#: 0.4 10*3/uL (ref 0.1–0.9)
MONO%: 6.8 % (ref 0.0–14.0)
NEUT#: 3.3 10*3/uL (ref 1.5–6.5)
NEUT%: 52.7 % (ref 38.4–76.8)
Platelets: 183 10*3/uL (ref 145–400)
RBC: 3.33 10*6/uL — ABNORMAL LOW (ref 3.70–5.45)
RDW: 13.1 % (ref 11.2–14.5)
WBC: 6.2 10*3/uL (ref 3.9–10.3)
lymph#: 2.3 10*3/uL (ref 0.9–3.3)
nRBC: 0 % (ref 0–0)

## 2010-01-24 LAB — CBC WITH DIFFERENTIAL/PLATELET
EOS%: 3.6 % (ref 0.0–7.0)
Eosinophils Absolute: 0.2 10*3/uL (ref 0.0–0.5)
LYMPH%: 36.6 % (ref 14.0–49.7)
MCH: 31.4 pg (ref 25.1–34.0)
MCHC: 33 g/dL (ref 31.5–36.0)
MCV: 95.2 fL (ref 79.5–101.0)
MONO%: 5.6 % (ref 0.0–14.0)
NEUT#: 3.2 10*3/uL (ref 1.5–6.5)
Platelets: 189 10*3/uL (ref 145–400)
RBC: 3.12 10*6/uL — ABNORMAL LOW (ref 3.70–5.45)
nRBC: 0 % (ref 0–0)

## 2010-01-24 LAB — COMPREHENSIVE METABOLIC PANEL
ALT: 11 U/L (ref 0–35)
CO2: 24 mEq/L (ref 19–32)
Creatinine, Ser: 1 mg/dL (ref 0.40–1.20)
Glucose, Bld: 116 mg/dL — ABNORMAL HIGH (ref 70–99)
Total Bilirubin: 0.3 mg/dL (ref 0.3–1.2)

## 2010-01-24 LAB — CA 125: CA 125: 42.7 U/mL — ABNORMAL HIGH (ref 0.0–30.2)

## 2010-03-19 ENCOUNTER — Ambulatory Visit: Payer: Self-pay | Admitting: Oncology

## 2010-03-21 LAB — COMPREHENSIVE METABOLIC PANEL
ALT: 15 U/L (ref 0–35)
AST: 18 U/L (ref 0–37)
Albumin: 3.9 g/dL (ref 3.5–5.2)
Alkaline Phosphatase: 47 U/L (ref 39–117)
Chloride: 104 mEq/L (ref 96–112)
Potassium: 4.8 mEq/L (ref 3.5–5.3)
Sodium: 138 mEq/L (ref 135–145)
Total Protein: 7.7 g/dL (ref 6.0–8.3)

## 2010-03-21 LAB — CBC WITH DIFFERENTIAL/PLATELET
BASO%: 0.2 % (ref 0.0–2.0)
EOS%: 2.7 % (ref 0.0–7.0)
MCH: 30.5 pg (ref 25.1–34.0)
MCV: 90.5 fL (ref 79.5–101.0)
MONO%: 7.6 % (ref 0.0–14.0)
RBC: 3.41 10*6/uL — ABNORMAL LOW (ref 3.70–5.45)
RDW: 14.1 % (ref 11.2–14.5)
lymph#: 2.3 10*3/uL (ref 0.9–3.3)

## 2010-03-23 ENCOUNTER — Emergency Department (HOSPITAL_COMMUNITY)
Admission: EM | Admit: 2010-03-23 | Discharge: 2010-03-23 | Payer: Self-pay | Source: Home / Self Care | Admitting: Emergency Medicine

## 2010-04-09 ENCOUNTER — Ambulatory Visit
Admission: RE | Admit: 2010-04-09 | Discharge: 2010-04-09 | Payer: Self-pay | Source: Home / Self Care | Attending: Gynecologic Oncology | Admitting: Gynecologic Oncology

## 2010-04-10 NOTE — Consult Note (Signed)
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  HISTORY OF PRESENT ILLNESS:  This is a 70 year old with remote history of vaginal hysterectomy and right salpingo-oophorectomy in 1988.  She presented with a pelvic mass in 2010 and on April 02, 2009, she underwent exploratory laparotomy, bilateral salpingo-oophorectomy, and omentectomy.  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  She received 6 cycles of adjuvant Taxol and carboplatin therapy and upon completion of therapy was noted to have an elevated CA-125.  Her administration of chemotherapy was complicated by significant neuropathy and anemia requiring several packed red blood cell transfusions.  In September 2010, she was noted to have minimal elevation in her CA-125 to 40.4.  At that time, she was completely asymptomatic and imaging was without any evidence of recurrent disease.  Given her poor tolerance of chemotherapy, plan was made for administration of tamoxifen.  On tamoxifen therapy, her CA-125 has remained stable.  Last value in December 2011 returned the value of 43, value on November 2011 was 42.7. At this visit, Ms. Olarte states that she is doing very well and she reports only complaints consistent with her arthritic condition.  There is no nausea, vomiting, no abdominal pain, bloating, changes in her appetite, hematuria, vaginal bleeding or hematochezia.  She denies any diarrhea or constipation.  States that she tolerated tamoxifen without any difficulty.  PAST MEDICAL HISTORY: 1. Hypertension. 2.  Hypercholesterolemia. 3. Paranoid schizophrenia. 4. Stage III C ovarian cancer.  PAST SURGICAL HISTORY: 1. Total vaginal hysterectomy and right salpingo-oophorectomy, 1988. 2. Bilateral salpingo-oophorectomy, omentectomy in 2011. 3. Cholecystectomy 2007. 4. Ganglion cyst removal from the left hand remotely. 5. Tubal ligation, 1963.  SOCIAL HISTORY:  The patient denies tobacco or alcohol use.  Her daughters are Jehovah's Witness.  REVIEW OF SYSTEMS:  Ten-point review of systems only notable as above.  PHYSICAL EXAMINATION:  GENERAL:  Well-developed thin female, in no acute distress. VITAL SIGNS:  Weight 192 pounds, blood pressure 124/64, pulse was 72. CHEST:  Clear to auscultation. HEART:  Regular rate and rhythm. BACK:  No CVA tenderness. LYMPH NODES:  No cervical, supraclavicular, or inguinal adenopathy. ABDOMEN:  Soft, obese, nontender.  No palpable masses. PELVIC:  Normal external genitalia, Bartholin, urethral and Skene, and atrophic vagina.  No nodularity within the cul-de-sac. RECTAL:  Good anal sphincter tone without any masses.  IMPRESSION:  Stage III C epithelial ovarian carcinoma, disease status is unclear and this difficult discussion was had with Ms. Demont.  She wondered whether her disease was in remission.  I informed her that there was no radiographic evidence of recurrence.  However, the CA-125 was minimally elevated and it appeared that the tamoxifen if it is having a cytotoxic  effect is successful in keeping the process stable. I am not entirely clear that she understood everything I was saying.  I have asked her to follow up with Dr. Darrold Span in February as previously scheduled.  I have also asked her to follow up with our service in 3 months.     Laurette Schimke, MD     WB/MEDQ  D:  04/09/2010  T:  04/09/2010  Job:  [id number]  cc:   Lennis P. Darrold Span, M.D. Fax: [phone]  Telford Nab, R.N. [address] [place], [place] [address]  Renaye Rakers, M.D. Fax: [phone]  Electronically Signed by Laurette Schimke MD on 04/10/2010 02:29:10 PM

## 2010-04-13 ENCOUNTER — Encounter: Payer: Self-pay | Admitting: Family Medicine

## 2010-05-16 ENCOUNTER — Other Ambulatory Visit: Payer: Self-pay | Admitting: Oncology

## 2010-05-16 ENCOUNTER — Encounter (HOSPITAL_BASED_OUTPATIENT_CLINIC_OR_DEPARTMENT_OTHER): Payer: MEDICARE | Admitting: Oncology

## 2010-05-16 DIAGNOSIS — C569 Malignant neoplasm of unspecified ovary: Secondary | ICD-10-CM

## 2010-05-16 DIAGNOSIS — Z5111 Encounter for antineoplastic chemotherapy: Secondary | ICD-10-CM

## 2010-05-16 DIAGNOSIS — D649 Anemia, unspecified: Secondary | ICD-10-CM

## 2010-05-16 DIAGNOSIS — Z5189 Encounter for other specified aftercare: Secondary | ICD-10-CM

## 2010-05-16 LAB — COMPREHENSIVE METABOLIC PANEL
ALT: 14 U/L (ref 0–35)
AST: 17 U/L (ref 0–37)
CO2: 25 mEq/L (ref 19–32)
Calcium: 9.6 mg/dL (ref 8.4–10.5)
Chloride: 104 mEq/L (ref 96–112)
Sodium: 137 mEq/L (ref 135–145)
Total Bilirubin: 0.3 mg/dL (ref 0.3–1.2)
Total Protein: 7.3 g/dL (ref 6.0–8.3)

## 2010-05-16 LAB — CBC WITH DIFFERENTIAL/PLATELET
Basophils Absolute: 0 10*3/uL (ref 0.0–0.1)
HCT: 30.7 % — ABNORMAL LOW (ref 34.8–46.6)
HGB: 10.3 g/dL — ABNORMAL LOW (ref 11.6–15.9)
MONO#: 0.5 10*3/uL (ref 0.1–0.9)
NEUT%: 50.6 % (ref 38.4–76.8)
WBC: 7.6 10*3/uL (ref 3.9–10.3)
lymph#: 3 10*3/uL (ref 0.9–3.3)

## 2010-05-16 LAB — CA 125: CA 125: 31.4 U/mL — ABNORMAL HIGH (ref 0.0–30.2)

## 2010-06-02 LAB — COMPREHENSIVE METABOLIC PANEL
ALT: 20 U/L (ref 0–35)
AST: 28 U/L (ref 0–37)
Albumin: 3.4 g/dL — ABNORMAL LOW (ref 3.5–5.2)
Alkaline Phosphatase: 46 U/L (ref 39–117)
CO2: 20 mEq/L (ref 19–32)
Chloride: 107 mEq/L (ref 96–112)
Creatinine, Ser: 1.23 mg/dL — ABNORMAL HIGH (ref 0.4–1.2)
GFR calc Af Amer: 52 mL/min — ABNORMAL LOW (ref >60)
GFR calc non Af Amer: 43 mL/min — ABNORMAL LOW (ref >60)
Potassium: 3.4 mEq/L — ABNORMAL LOW (ref 3.5–5.1)
Sodium: 135 mEq/L (ref 135–145)
Total Bilirubin: 0.4 mg/dL (ref 0.3–1.2)

## 2010-06-02 LAB — CBC
Hemoglobin: 10.7 g/dL — ABNORMAL LOW (ref 12.0–15.0)
MCH: 30.7 pg (ref 26.0–34.0)
Platelets: 175 10*3/uL (ref 150–400)
RBC: 3.48 MIL/uL — ABNORMAL LOW (ref 3.87–5.11)
WBC: 6.8 10*3/uL (ref 4.0–10.5)

## 2010-06-02 LAB — URINALYSIS, ROUTINE W REFLEX MICROSCOPIC
Bilirubin Urine: NEGATIVE
Hgb urine dipstick: NEGATIVE
Ketones, ur: NEGATIVE mg/dL
Nitrite: NEGATIVE
Specific Gravity, Urine: 1.02 (ref 1.005–1.030)
pH: 5.5 (ref 5.0–8.0)

## 2010-06-02 LAB — DIFFERENTIAL
Basophils Relative: 0 % (ref 0–1)
Eosinophils Relative: 1 % (ref 0–5)
Lymphs Abs: 2 10*3/uL (ref 0.7–4.0)
Monocytes Absolute: 0.4 10*3/uL (ref 0.1–1.0)

## 2010-06-02 LAB — URINE MICROSCOPIC-ADD ON

## 2010-06-08 LAB — CBC
HCT: 28.6 % — ABNORMAL LOW (ref 36.0–46.0)
HCT: 26.5 % — ABNORMAL LOW (ref 36.0–46.0)
HCT: 27.4 % — ABNORMAL LOW (ref 36.0–46.0)
HCT: 27.4 % — ABNORMAL LOW (ref 36.0–46.0)
Hemoglobin: 7 g/dL — ABNORMAL LOW (ref 12.0–15.0)
Hemoglobin: 9.1 g/dL — ABNORMAL LOW (ref 12.0–15.0)
Hemoglobin: 9.2 g/dL — ABNORMAL LOW (ref 12.0–15.0)
Hemoglobin: 9.5 g/dL — ABNORMAL LOW (ref 12.0–15.0)
Hemoglobin: 8.7 g/dL — ABNORMAL LOW (ref 12.0–15.0)
Hemoglobin: 8.9 g/dL — ABNORMAL LOW (ref 12.0–15.0)
Hemoglobin: 9 g/dL — ABNORMAL LOW (ref 12.0–15.0)
Hemoglobin: 9.4 g/dL — ABNORMAL LOW (ref 12.0–15.0)
MCHC: 32.6 g/dL (ref 30.0–36.0)
MCHC: 33.1 g/dL (ref 30.0–36.0)
MCHC: 33.2 g/dL (ref 30.0–36.0)
MCHC: 33.3 g/dL (ref 30.0–36.0)
MCHC: 32.7 g/dL (ref 30.0–36.0)
MCV: 80.5 fL (ref 78.0–100.0)
MCV: 82.2 fL (ref 78.0–100.0)
MCV: 83.2 fL (ref 78.0–100.0)
MCV: 82.1 fL (ref 78.0–100.0)
MCV: 82.3 fL (ref 78.0–100.0)
MCV: 82.9 fL (ref 78.0–100.0)
Platelets: 327 10*3/uL (ref 150–400)
Platelets: 376 10*3/uL (ref 150–400)
RBC: 2.66 MIL/uL — ABNORMAL LOW (ref 3.87–5.11)
RBC: 3.32 MIL/uL — ABNORMAL LOW (ref 3.87–5.11)
RBC: 3.36 MIL/uL — ABNORMAL LOW (ref 3.87–5.11)
RBC: 3.44 MIL/uL — ABNORMAL LOW (ref 3.87–5.11)
RBC: 3.19 MIL/uL — ABNORMAL LOW (ref 3.87–5.11)
RBC: 3.3 MIL/uL — ABNORMAL LOW (ref 3.87–5.11)
RBC: 3.32 MIL/uL — ABNORMAL LOW (ref 3.87–5.11)
RBC: 3.41 MIL/uL — ABNORMAL LOW (ref 3.87–5.11)
RDW: 18.1 % — ABNORMAL HIGH (ref 11.5–15.5)
RDW: 18.8 % — ABNORMAL HIGH (ref 11.5–15.5)
WBC: 7.5 10*3/uL (ref 4.0–10.5)
WBC: 8 10*3/uL (ref 4.0–10.5)
WBC: 10.8 10*3/uL — ABNORMAL HIGH (ref 4.0–10.5)
WBC: 6.7 10*3/uL (ref 4.0–10.5)
WBC: 7.6 10*3/uL (ref 4.0–10.5)

## 2010-06-08 LAB — BASIC METABOLIC PANEL
BUN: 12 mg/dL (ref 6–23)
CO2: 26 mEq/L (ref 19–32)
CO2: 26 mEq/L (ref 19–32)
CO2: 28 mEq/L (ref 19–32)
CO2: 25 mEq/L (ref 19–32)
CO2: 26 mEq/L (ref 19–32)
CO2: 26 mEq/L (ref 19–32)
Calcium: 7.9 mg/dL — ABNORMAL LOW (ref 8.4–10.5)
Calcium: 8.3 mg/dL — ABNORMAL LOW (ref 8.4–10.5)
Calcium: 8.6 mg/dL (ref 8.4–10.5)
Calcium: 8.6 mg/dL (ref 8.4–10.5)
Chloride: 103 mEq/L (ref 96–112)
Chloride: 105 mEq/L (ref 96–112)
Chloride: 106 mEq/L (ref 96–112)
Chloride: 105 mEq/L (ref 96–112)
Chloride: 105 mEq/L (ref 96–112)
Chloride: 108 mEq/L (ref 96–112)
Creatinine, Ser: 0.67 mg/dL (ref 0.4–1.2)
Creatinine, Ser: 0.7 mg/dL (ref 0.4–1.2)
GFR calc Af Amer: >60 mL/min (ref >60)
GFR calc Af Amer: >60 mL/min (ref >60)
GFR calc Af Amer: >60 mL/min (ref >60)
GFR calc Af Amer: >60 mL/min (ref >60)
GFR calc Af Amer: >60 mL/min (ref >60)
GFR calc Af Amer: >60 mL/min (ref >60)
GFR calc non Af Amer: >60 mL/min (ref >60)
GFR calc non Af Amer: >60 mL/min (ref >60)
Glucose, Bld: 102 mg/dL — ABNORMAL HIGH (ref 70–99)
Potassium: 4.4 mEq/L (ref 3.5–5.1)
Potassium: 3.8 mEq/L (ref 3.5–5.1)
Potassium: 3.8 mEq/L (ref 3.5–5.1)
Potassium: 4 mEq/L (ref 3.5–5.1)
Sodium: 134 mEq/L — ABNORMAL LOW (ref 135–145)
Sodium: 135 mEq/L (ref 135–145)
Sodium: 135 mEq/L (ref 135–145)
Sodium: 135 mEq/L (ref 135–145)
Sodium: 136 mEq/L (ref 135–145)
Sodium: 137 mEq/L (ref 135–145)

## 2010-06-08 LAB — GLUCOSE, CAPILLARY
Glucose-Capillary: 103 mg/dL — ABNORMAL HIGH (ref 70–99)
Glucose-Capillary: 112 mg/dL — ABNORMAL HIGH (ref 70–99)
Glucose-Capillary: 117 mg/dL — ABNORMAL HIGH (ref 70–99)
Glucose-Capillary: 128 mg/dL — ABNORMAL HIGH (ref 70–99)
Glucose-Capillary: 142 mg/dL — ABNORMAL HIGH (ref 70–99)
Glucose-Capillary: 101 mg/dL — ABNORMAL HIGH (ref 70–99)
Glucose-Capillary: 106 mg/dL — ABNORMAL HIGH (ref 70–99)
Glucose-Capillary: 107 mg/dL — ABNORMAL HIGH (ref 70–99)
Glucose-Capillary: 107 mg/dL — ABNORMAL HIGH (ref 70–99)
Glucose-Capillary: 113 mg/dL — ABNORMAL HIGH (ref 70–99)
Glucose-Capillary: 114 mg/dL — ABNORMAL HIGH (ref 70–99)
Glucose-Capillary: 117 mg/dL — ABNORMAL HIGH (ref 70–99)
Glucose-Capillary: 120 mg/dL — ABNORMAL HIGH (ref 70–99)
Glucose-Capillary: 121 mg/dL — ABNORMAL HIGH (ref 70–99)
Glucose-Capillary: 128 mg/dL — ABNORMAL HIGH (ref 70–99)
Glucose-Capillary: 128 mg/dL — ABNORMAL HIGH (ref 70–99)
Glucose-Capillary: 131 mg/dL — ABNORMAL HIGH (ref 70–99)
Glucose-Capillary: 91 mg/dL (ref 70–99)
Glucose-Capillary: 96 mg/dL (ref 70–99)

## 2010-06-08 LAB — COMPREHENSIVE METABOLIC PANEL
ALT: 8 U/L (ref 0–35)
ALT: 22 U/L (ref 0–35)
ALT: 35 U/L (ref 0–35)
AST: 14 U/L (ref 0–37)
AST: 29 U/L (ref 0–37)
Albumin: 1.5 g/dL — ABNORMAL LOW (ref 3.5–5.2)
Albumin: 1.7 g/dL — ABNORMAL LOW (ref 3.5–5.2)
Alkaline Phosphatase: 33 U/L — ABNORMAL LOW (ref 39–117)
Alkaline Phosphatase: 44 U/L (ref 39–117)
Alkaline Phosphatase: 55 U/L (ref 39–117)
BUN: 12 mg/dL (ref 6–23)
BUN: 13 mg/dL (ref 6–23)
CO2: 25 mEq/L (ref 19–32)
CO2: 27 mEq/L (ref 19–32)
Calcium: 8 mg/dL — ABNORMAL LOW (ref 8.4–10.5)
Calcium: 8.6 mg/dL (ref 8.4–10.5)
Chloride: 104 mEq/L (ref 96–112)
Chloride: 105 mEq/L (ref 96–112)
Creatinine, Ser: 0.76 mg/dL (ref 0.4–1.2)
GFR calc Af Amer: >60 mL/min (ref >60)
GFR calc non Af Amer: >60 mL/min (ref >60)
GFR calc non Af Amer: >60 mL/min (ref >60)
GFR calc non Af Amer: >60 mL/min (ref >60)
Glucose, Bld: 114 mg/dL — ABNORMAL HIGH (ref 70–99)
Glucose, Bld: 110 mg/dL — ABNORMAL HIGH (ref 70–99)
Potassium: 3.7 mEq/L (ref 3.5–5.1)
Potassium: 4.2 mEq/L (ref 3.5–5.1)
Sodium: 135 mEq/L (ref 135–145)
Sodium: 134 mEq/L — ABNORMAL LOW (ref 135–145)
Sodium: 134 mEq/L — ABNORMAL LOW (ref 135–145)
Total Bilirubin: 0.3 mg/dL (ref 0.3–1.2)
Total Bilirubin: 0.3 mg/dL (ref 0.3–1.2)
Total Protein: 5.4 g/dL — ABNORMAL LOW (ref 6.0–8.3)
Total Protein: 6.6 g/dL (ref 6.0–8.3)

## 2010-06-08 LAB — PHOSPHORUS
Phosphorus: 3.8 mg/dL (ref 2.3–4.6)
Phosphorus: 4 mg/dL (ref 2.3–4.6)

## 2010-06-08 LAB — DIFFERENTIAL
Basophils Absolute: 0 10*3/uL (ref 0.0–0.1)
Basophils Absolute: 0 10*3/uL (ref 0.0–0.1)
Basophils Absolute: 0.1 10*3/uL (ref 0.0–0.1)
Basophils Relative: 0 % (ref 0–1)
Basophils Relative: 1 % (ref 0–1)
Eosinophils Absolute: 0.2 10*3/uL (ref 0.0–0.7)
Eosinophils Absolute: 0.4 10*3/uL (ref 0.0–0.7)
Eosinophils Relative: 2 % (ref 0–5)
Lymphocytes Relative: 17 % (ref 12–46)
Monocytes Absolute: 0.7 10*3/uL (ref 0.1–1.0)
Monocytes Relative: 11 % (ref 3–12)
Monocytes Relative: 7 % (ref 3–12)
Neutro Abs: 4.7 10*3/uL (ref 1.7–7.7)
Neutro Abs: 6.6 10*3/uL (ref 1.7–7.7)
Neutrophils Relative %: 70 % (ref 43–77)
Neutrophils Relative %: 66 % (ref 43–77)
Neutrophils Relative %: 69 % (ref 43–77)

## 2010-06-08 LAB — CROSSMATCH
ABO/RH(D): O POS
Antibody Screen: NEGATIVE

## 2010-06-08 LAB — PREALBUMIN
Prealbumin: 7.4 mg/dL — ABNORMAL LOW (ref 18.0–45.0)
Prealbumin: 19.1 mg/dL (ref 18.0–45.0)
Prealbumin: 7.2 mg/dL — ABNORMAL LOW (ref 18.0–45.0)

## 2010-06-08 LAB — CLOSTRIDIUM DIFFICILE EIA
C difficile Toxins A+B, EIA: 22
C difficile Toxins A+B, EIA: NEGATIVE

## 2010-06-08 LAB — TYPE AND SCREEN

## 2010-06-08 LAB — MAGNESIUM
Magnesium: 1.8 mg/dL (ref 1.5–2.5)
Magnesium: 2 mg/dL (ref 1.5–2.5)

## 2010-06-08 LAB — TRIGLYCERIDES
Triglycerides: 145 mg/dL (ref <150)
Triglycerides: 176 mg/dL — ABNORMAL HIGH (ref <150)

## 2010-06-09 LAB — CROSSMATCH
ABO/RH(D): O POS
Antibody Screen: NEGATIVE

## 2010-06-23 LAB — COMPREHENSIVE METABOLIC PANEL
Albumin: 2.9 g/dL — ABNORMAL LOW (ref 3.5–5.2)
BUN: 19 mg/dL (ref 6–23)
Creatinine, Ser: 1.01 mg/dL (ref 0.4–1.2)
Total Protein: 8.7 g/dL — ABNORMAL HIGH (ref 6.0–8.3)

## 2010-06-23 LAB — DIFFERENTIAL
Basophils Absolute: 0 10*3/uL (ref 0.0–0.1)
Lymphocytes Relative: 25 % (ref 12–46)
Monocytes Absolute: 0.5 10*3/uL (ref 0.1–1.0)
Monocytes Relative: 7 % (ref 3–12)
Neutro Abs: 5 10*3/uL (ref 1.7–7.7)

## 2010-06-23 LAB — CBC
HCT: 29 % — ABNORMAL LOW (ref 36.0–46.0)
MCV: 78.9 fL (ref 78.0–100.0)
Platelets: 390 10*3/uL (ref 150–400)
RDW: 17.3 % — ABNORMAL HIGH (ref 11.5–15.5)

## 2010-06-23 LAB — ABO/RH: ABO/RH(D): O POS

## 2010-06-23 LAB — TYPE AND SCREEN

## 2010-07-03 ENCOUNTER — Ambulatory Visit: Payer: MEDICARE | Attending: Gynecologic Oncology | Admitting: Gynecologic Oncology

## 2010-07-03 ENCOUNTER — Encounter (HOSPITAL_BASED_OUTPATIENT_CLINIC_OR_DEPARTMENT_OTHER): Payer: MEDICARE | Admitting: Oncology

## 2010-07-03 ENCOUNTER — Other Ambulatory Visit: Payer: Self-pay | Admitting: Oncology

## 2010-07-03 DIAGNOSIS — Z90710 Acquired absence of both cervix and uterus: Secondary | ICD-10-CM | POA: Insufficient documentation

## 2010-07-03 DIAGNOSIS — Z9221 Personal history of antineoplastic chemotherapy: Secondary | ICD-10-CM | POA: Insufficient documentation

## 2010-07-03 DIAGNOSIS — Z452 Encounter for adjustment and management of vascular access device: Secondary | ICD-10-CM

## 2010-07-03 DIAGNOSIS — Z79899 Other long term (current) drug therapy: Secondary | ICD-10-CM | POA: Insufficient documentation

## 2010-07-03 DIAGNOSIS — C569 Malignant neoplasm of unspecified ovary: Secondary | ICD-10-CM | POA: Insufficient documentation

## 2010-07-03 DIAGNOSIS — Z9079 Acquired absence of other genital organ(s): Secondary | ICD-10-CM | POA: Insufficient documentation

## 2010-07-03 LAB — COMPREHENSIVE METABOLIC PANEL
ALT: 30 U/L (ref 0–35)
AST: 26 U/L (ref 0–37)
Alkaline Phosphatase: 44 U/L (ref 39–117)
Calcium: 9.5 mg/dL (ref 8.4–10.5)
Chloride: 104 mEq/L (ref 96–112)
Creatinine, Ser: 1 mg/dL (ref 0.40–1.20)
Potassium: 4.4 mEq/L (ref 3.5–5.3)

## 2010-07-03 LAB — CBC WITH DIFFERENTIAL/PLATELET
Basophils Absolute: 0 10*3/uL (ref 0.0–0.1)
EOS%: 3.7 % (ref 0.0–7.0)
HGB: 10.6 g/dL — ABNORMAL LOW (ref 11.6–15.9)
LYMPH%: 35.8 % (ref 14.0–49.7)
MCH: 28.6 pg (ref 25.1–34.0)
MCV: 87 fL (ref 79.5–101.0)
MONO%: 4.9 % (ref 0.0–14.0)
NEUT%: 55.4 % (ref 38.4–76.8)
Platelets: 218 10*3/uL (ref 145–400)
RDW: 14.2 % (ref 11.2–14.5)

## 2010-07-07 NOTE — Consult Note (Addendum)
  Jaclyn [name], Jaclyn [name]             ACCOUNT NO.:  [id number]  MEDICAL RECORD NO.:  [id number]           PATIENT TYPE:  LOCATION:                                 FACILITY:  PHYSICIAN:  Laurette Schimke, MD     DATE OF BIRTH:  07-27-1940  DATE OF CONSULTATION:  07/03/2010 DATE OF DISCHARGE:                                CONSULTATION   REASON FOR VISIT:  Surveillance for stage IIIC ovarian cancer.  HISTORY OF PRESENT ILLNESS:  This is a 70 year old with remote history of vaginal hysterectomy, right salpingo-oophorectomy, completed in 1988. Pelvic mass was noted in January 2011.  She underwent exploratory laparotomy, bilateral salpingo-oophorectomy, omentectomy.  Final pathology was consistent with stage IIIC seromucinous endometrioid adenocarcinoma.  She received 6 cycles of adjuvant Taxol and carboplatin therapy with persistent elevation in a CA-125.  Treatment was complicated by anemia, requiring multiple blood transfusions.  In September 2010, her CA-125 remained elevated minimally to 40.4.  Imaging was unremarkable and given her poor tolerance of chemotherapy, a trial of tamoxifen was undertaken.  Her CA-125 is at its nadir, as of May 16, 2010, is 66.  Jaclyn [name] feels very well.  PAST MEDICAL HISTORY:  No interval changes.  PAST SURGICAL HISTORY:  No interval changes.  MEDICATIONS:  No changes.  REVIEW OF SYSTEMS:  No nausea, vomiting, fever, chills.  Occasional nausea.  She reports diarrhea on 1 occasion since my last visit.  Her arthritis pain is persistent.  No rectal, vaginal bleeding.  No hematuria.  No abdominal bloating or changes in her appetite. Otherwise, 10-point review of systems is noncontributory.  PHYSICAL EXAMINATION:  VITAL SIGNS:  Weight 198 pounds.  Blood pressure 118/68, pulse of 76. CHEST:  Clear to auscultation. HEART:  Regular rate and rhythm. LYMPH NODE SURVEY:  No cervical, supraclavicular, or inguinal adenopathy. ABDOMEN:  Soft, obese  without any masses. PELVIC:  No nodularity noted within the vagina.  Good anal sphincter tone without any masses.  IMPRESSION:  Jaclyn [name] without any clinical evidence of disease from her stage IIIC ovarian cancer.  She is tolerating the tamoxifen well and has a scheduled followup with her ophthalmologist next week.  I think this strategy of Dr. Darrold Span was excellent.  It is improved the patient's quality of life with minimal toxicity and at this time she has no anxiety regarding her CA-125, but given the fact level is 31.4, I have asked Jaclyn [name] to follow up in 4 months.     Laurette Schimke, MD     WB/MEDQ  D:  07/03/2010  T:  07/04/2010  Job:  [id number]  cc:   Lennis P. Darrold Span, M.D. Fax: [phone]  Renaye Rakers, M.D. Fax: [phone]  Dr. Zoila Shutter  Telford Nab, RN MSN  Electronically Signed by Laurette Schimke MD on 07/07/2010 11:24:29 AM

## 2010-08-08 NOTE — Procedures (Signed)
[place]  Patient:    Jaclyn [name], Jaclyn [name]                    MRN: [id number] Proc. Date: 10/10/99 Adm. Date:  [id number] Attending:  Altamese  CC:         Cephas Darby. Daphine Deutscher, M.D.                           Procedure Report  PREOPERATIVE DIAGNOSIS:  Diarrhea, presumed infectious.  POSTOPERATIVE DIAGNOSIS: Grossly normal colonoscopic examination to cecum with gelatinous, greenish, thin material in the colon.  PROCEDURE:  Colonoscopy with biopsies and collections of stool.  MEDICATIONS:  Demerol 50 mg IV, Versed 5 mg IV over a ten minute period of time.  INSTRUMENT:  Olympus video pancolonoscope.  ENDOSCOPIST:  Sharyn Dross., M.D.  INFORMED CONSENT:  The patient was advised of procedure and indications and risks involved.  The patient has agreed to have the procedure performed.  PROCEDURE IN DETAIL:  The patient was brought in the endoscopy unit and IV sedating medication was used.  A monitor was placed on the patient to monitor the patients vital signs and oxygen saturation.  The nasal oxygen at 2 liters per minute was used and the procedure was begun.  No bowel preparation was given because of diarrhea at this time.  The instrument was advanced with the patient lying in the left lateral position to approximately 80 cm from the proximal colon to the proximal ascending colon that was noted.  There appeared to be evidence of watery to gelatinous-type stools that were present, greenish in color, that was noted throughout the colon.  Samples of this material were taken and sent for analysis and laboratory data.  Otherwise there was no evidence of any masses, polyps, strictures, or lesions appreciated.  The vascular pattern appeared to be within normal limits and the mucosal pattern showed no granular changes, no diverticular changes at this time.  Random biopsies were taken also for analysis to rule out any evidence of inflammatory  changes that could be present.  There was no evidence of internal nor external hemorrhoids noted.  RECOMMENDATIONS:  I would recommend awaiting the results of both the stool analysis, as well as the biopsy reports, at this time.  Continue conservative management during the interim.  If the patient is afebrile at this time, would also give a medication, such as Imodium or Lomotil, to help slow the diarrhea process present.  Depending upon the results of the cultures will determine the course of therapy. DD:  10/10/99 TD:  10/12/99 Job: [id number]

## 2010-08-08 NOTE — Consult Note (Signed)
[place]  Patient:    Jaclyn [name], Jaclyn [name]                    MRN: [id number] Proc. Date: 10/09/99 Adm. Date:  [id number] Attending:  Alwyn Pea D. Dictator:   [id number] CC:         Tanya D. Daphine Deutscher, M.D.                          Consultation Report  REASON FOR CONSULTATION: This pleasant 70 year old black female was referred for evaluation because of diarrhea and abdominal pain.  HISTORY OF PRESENT ILLNESS: The patient has been seen by me in the past and has been evaluated with a history of colon polyps.  The last colonoscopy was done in June 2000 and results were normal.  She has been relatively stable up until recently when she was admitted to the hospital with fever associated with nausea, vomiting, and diarrhea.  The patient states that the symptoms came on with right lower quadrant pain and discomfort, more aggravated with movement at that time.  She had some confusion noted also and thus there may have been some difficulties present.  She previously had been on a medication called Lotronex for irritable bowel syndrome that was present and this appeared to have kept her in check without any problems or complications, but since the medication has been discontinued the patient states that her GI symptoms have been more aggravated.  She presented to the emergency room and was evaluated by a physician at that time and based on that evaluation a lumbar spine film was also obtained to determine if evidence in association with fever or possible meningitis was present.  The results appeared to be unremarkable from that standpoint.  Consultation was obtained for evaluation of the patients symptoms of diarrhea, which is still presently ongoing at this time.  The patient states her stools are basically watery still at this time.  She denies any history of any blood or any melanotic stools present at this point. The vomiting has improved and she is able  to tolerate p.o. liquids and possibly solids, but at least the liquids we know.  She is not having any further difficulties with this at this time.  There is no history of any change in weight except there is a change in appetite that has been documented at this time.  PHYSICAL EXAMINATION:  GENERAL: She is a pleasant female who appears to be resting comfortably in bed at this time.  She is communicative, with slightly decreased affect noted.  HEENT: Sclerae anicteric.  NECK: Supple.  LUNGS: Clear to auscultation.  HEART: Regular rate and rhythm without heaves, thrills, murmurs, or gallops noted.  ABDOMEN: Soft.  Positive tenderness in the supraumbilical region as well as in the right lower quadrant region.  The abdomen is distended.  No tympany is presently noted.  EXTREMITIES: The extremities appear to be within normal limits.  LABORATORY DATA: WBC initially was 16.5 and presently is 9.3.  Hemoglobin 11.3, hematocrit 34.6; normal indices; normal platelet count; 85% neutrophils predominant at this time.  Electrolytes show a serum sodium initially of 130 at the time of admission but presently now 133.  Potassium 3.4, chloride 107 now, CO2 20, BUN 11, creatinine 1.  Total bilirubin 0.5, alkaline phosphatase 30.  Normal liver enzymes with SGOT and SGPT normal.  Albumin level is low at 2.8.  Calcium level is 8.5.  Serum spinal fluid tap shows colorless and clear fluid, 5 wbcs, 67 rbcs; too few to count segmented neutrophils.  Urinalysis on admission showed a specific gravity of 1.005, pH 6, trace blood; microscopic showed 0-5 rbc/hpf.  X-RAY STUDIES: Chest x-ray normal.  GROSS IMPRESSION: Fever with nausea and vomiting and diarrhea, possibly a gastroenteritis process that may be ongoing at this time.  The fact that she still has diarrhea makes me consider this still could be a process of irritable bowel syndrome versus some viral or infectious etiology that  is present.  RECOMMENDATIONS: I am presently going to schedule the patient for colonoscopic examination in the a.m., unprepped.  This will be to dramatic the level of the amount of diarrhea she does have as well as to help send for analysis at this point.  If evidence that the stools have changed to a more solid level and the patient is clinically stable would then recommend proceeding with conservative management from that standpoint and would just obtain routine culture and sensitivities on the patient.  Depending upon these results will determine the course of therapy.  Thank you again for allowing me to assist you in the management of her care. DD:  10/09/99 TD:  10/09/99 Job: [id number]

## 2010-08-08 NOTE — Discharge Summary (Signed)
NAME:  Jaclyn [name], Jaclyn [name]                       ACCOUNT NO.:  [id number]   MEDICAL RECORD NO.:  [id number]                   PATIENT TYPE:  INP   LOCATION:  0484                                 FACILITY:  [facility]   PHYSICIAN:  Renaye Rakers, M.D.                   DATE OF BIRTH:  17-Apr-1940   DATE OF ADMISSION:  09/11/2002  DATE OF DISCHARGE:                                 DISCHARGE SUMMARY   SUMMARY:  The patient is a 70 year old female known paranoid schizophrenic  on lithium who presented with a lithium overdose and hypokalemia.  Her  lithium dosage at the time of admission was 3.28 and her potassium was down  to 2.2 with a sodium of 132.  She also had nausea and vomiting during that  time, increase of BUN and creatinine, was found to be dehydrated.  She was  started on IV fluids with potassium supplementation and her lithium was  withheld.  Later her lithium level continued to improve.  Her potassium  level also slowly continued to come up and she had many runs of potassium in  order to try to get that potassium level up.  Concerning the patient's  mental status, at first she was lethargic and then she started to become  more alert.  It was noted that her gait was wobbly and we asked physical  therapy to work with her.  Initially the patient unfortunately fell also in  her own urine and this set [place] back by about three days because of soreness  but her x-rays were negative, but she complained of being extremely sore.  She was then able to get up and started to move around and was seen by  physical therapy, and they were able to walk her, but her gait from my point  of view seems quite wobbly and I feel that she needs additional physical  therapy at home.  Mentation-wise, she is alert and seems to be somewhat  confused still.  Her normal mentation is a pretty quiet person but now she  is a little bit more agitated, and I think this is because the lithium level  still is not straight  and it is going to take some additional tweaking to  get it in the right range.  Her family members include a daughter who is at  home all the time who has backed home to take care of her, but is working  and we are going to get home health care to come in, evaluate the situation,  and get a health aid to come in; also get physical therapy to come in and  work with her, and she will be seen by her psychiatrist as soon as possible.  The last lithium level was a little low and we are going to repeat it before  she goes home today, so I will not have that to put into this  report.  We  will continue her on her Protonix, her Zoloft.  We will continue her lithium  at 200 mg twice a day at this time, her Allegra 180, her estradiol, her  Diovan 80/12.5, and hyoscyamine 0.375 b.i.d., Abilify 20 mg q.h.s., and we  will have her seen in the office in a one-week period of time.   DISCHARGE DIAGNOSES:  She is discharged with a diagnosis of:  1. Nausea, vomiting, diarrhea.  2. Hypokalemia.  3. Dehydration.  4. Lithium toxicity.  5. Paranoid schizophrenia.  6. Irritable bowel syndrome.  7. Hypertension.  8. Depression.                                               Renaye Rakers, M.D.    VB/MEDQ  D:  09/19/2002  T:  09/19/2002  Job:  [id number]

## 2010-08-08 NOTE — Op Note (Signed)
Jaclyn [name], Jaclyn [name]             ACCOUNT NO.:  [id number]   MEDICAL RECORD NO.:  [id number]          PATIENT TYPE:  OBV   LOCATION:  0098                         FACILITY:  [facility]   PHYSICIAN:  Leonie Man, M.D.   DATE OF BIRTH:  05-20-1940   DATE OF PROCEDURE:  01/03/2004  DATE OF DISCHARGE:                                 OPERATIVE REPORT   PREOPERATIVE DIAGNOSIS:  Chronic cholecystitis.   POSTOPERATIVE DIAGNOSIS:  Acute-on-chronic cholecystitis.   PROCEDURE:  Laparoscopic cholecystectomy, intraoperative cholangiogram.   SURGEON:  Leonie Man, M.D.   ASSISTANT:  Joanne Gavel, M.D.   ANESTHESIA:  General.   NOTE:  The patient is a 70 year old female presenting with upper abdominal  pain, nausea and vomiting after being seen in the ER, and was noted on CT  scan to have cholelithiasis.  She comes to the operating room now after the  risks and potential benefits of surgery have been fully discussed, all  questions answered, and consent obtained.  The patient did have some mildly  elevated transaminases.  Bilirubin was within normal limits.   PROCEDURE:  Following the induction of satisfactory general anesthesia, the  patient was positioned supinely.  The abdomen was prepped and draped, to be  included in the sterile operative field.  An open laparoscopy was performed  through an umbilical wound and with the insertion of a Hasson cannula,  insufflation of the peritoneal cavity to 14 mmHg.  The camera was inserted.  Visual exploration of the abdomen showed an acutely inflamed gallbladder.  The liver edges were sharp.  The liver surface was smooth.  There were  multiple adhesions of the wall of the gallbladder noted.  The gallbladder  was quite hydropic.  None of the small or large intestines viewed appeared  to be abnormal.  There were multiple adhesions in the lower abdomen from  previous hysterectomy.   Under direct vision, epigastric and lateral ports are placed.  The  gallbladder is then grasped and aspirated, using a trocar aspirator, so as  to reduce the severe tension in the hydropic gallbladder.  Adhesions were  taken down from the gallbladder, and dissection carried down to the ampulla,  where the cystic artery and cystic ducts were isolated.  The cystic artery  was triply clipped and transected.  The cystic duct was clipped proximally  and opened.  I passed a Cook catheter into the abdomen and into the cystic  duct, where a cystic duct cholangiogram was carried out with one-half  strength Hypaque, the results of which showed a prompt flow of contrast into  the duodenum.  No filling defects.  There were normal-appearing hepatic  radicals.  The cholangiocatheter was removed, and the cystic duct doubly and  triply clipped and transected.  The gallbladder was then dissected free from  the liver bed using electrocautery and maintaining hemostasis throughout the  entire course of the dissection.  At the end of the dissection, the liver  bed was then thoroughly inspected, and additional bleeding points treated  with electrocautery.  The right upper quadrant was then thoroughly irrigated  with normal saline.  The gallbladder was placed in an EndoCatch and  retrieved through the umbilical wound without difficulty.  Sponge and  instrument counts were verified.  All fluid aspirated from the abdomen.  Pneumoperitoneum was then allowed to deflate.  The wounds were closed in  layers as follows:  The umbilical wound in two layers with 0 Vicryl and 4-0  Monocryl, epigastric and lateral flank wounds closed with 4-0  Monocryl sutures.  All wounds reinforced with Dermabond.  Sterile dressings  were then applied.  Anesthetic reversed.  Patient was moved from the  operating room to the recovery room in stable condition.  She tolerated the  procedure well.     Patr   PB/MEDQ  D:  01/03/2004  T:  01/03/2004  Job:  [id number]   cc:   Renaye Rakers, M.D.  [place] [address]  [address]  [place]  [place] [address]  Fax: [phone]

## 2010-08-08 NOTE — H&P (Signed)
[place]  Patient:    Jaclyn [name], Jaclyn [name]                    MRN: [id number] Adm. Date:  [id number] Disc. Date: [id number] Attending:  Alwyn Pea D.                         History and Physical  DATE OF BIRTH:  [birth year].  HISTORY OF PRESENT ILLNESS:  This is a 70 year old African-American female who presents to the emergency department with increased temperature x eight hours and a two-day history of diarrhea, abdominal pain, and vomiting.  The patient also has a decreased appetite.  She denies headache, dysuria, increased urgency or frequency.  The patient localizes the abdominal pain to the right lower quadrant and states that the pain is worse with movement.  She states that at is worst, the pain is a 4-5 out of 10 and is nonradiating.  She was confused at the time of initial presentation, and she also reports some fatigue and dizziness.  The patient does have a history of irritable bowel and has been noncompliant with her medicines.  There has been on change in appetite and no ill contacts.  Bowel movements are regular, with the last bowel movement being one day ago and was somewhat loose.  PAST MEDICAL HISTORY:  Significant for anxiety and inflammatory bowel disease.  MEDICATIONS:  Lithium 300 mg t.i.d., Lomotil, Aciphex 20 mg per day, _____ 1 mg q.h.s., Prevacid 30 mg per day, and Premarin 0.625 mg per day.  ALLERGIES:  SULFA, ATIVAN.  REVIEW OF SYSTEMS:  No headache, no neck stiffness, no hematochezia, no melena, no hematemesis, and all else negative except for HPI.  FAMILY HISTORY:  Noncontributory.  PHYSICAL EXAMINATION:  GENERAL:  In general, she is alert and oriented, in no acute distress.  VITAL SIGNS:  Temperature 39.6 T-max, current temperature 37.1, blood pressure 145/70, heart rate 100, respiratory rate 20, O2 saturations 96% on room air.  HEENT:  Mucous membranes moist.  Pupils are equal, round and reactive  to light.  Extraocular muscles intact.  NECK:  supple without adenopathy, thyromegaly, JVD, or bruits.  LUNGS:  Clear to auscultation bilaterally.  ABDOMEN:  Soft with right lower quadrant tenderness to palpation.  There is no rebound or guarding.  Normoactive bowel sounds, no masses.  There is a questionable amount of tenderness over McBurneys point, but the psoas sign is negative.  EXTREMITIES:  Without clubbing, cyanosis, or edema.  GENITOURINARY:  Foley catheter is in place.  BACK:  No CVA tenderness to palpation.  NEUROLOGIC:  Nonfocal.  RECTAL:  Hemoccult-negative.  No tenderness.  LABORATORY DATA:  Catheterized urinalysis reveals a pH of 6, a specific gravity of less than 1.005, trace blood, negative nitrites, negative leukocyte, few bacteria.  ABG 7.469 with a PCO2 of 42 and a bicarbonate of 16. White count 16.5, hemoglobin 11.1, hematocrit 32.3, platelets 295, 90 neutrophils, 6 lymphs, 3 monos.  Sodium 130, potassium 3.1, chloride 98, CO2 19, BUN 11, creatinine 1.2, glucose 146.  Bilirubin 0.8, SGOT 32, alkaline phosphatase 35, SGPT 14, total protein 7.4, albumin 3.2, calcium 9.3.  CSF is negative for organisms on Gram stain, total protein 49, glucose 92.  Lithium level is 0.86.  ASSESSMENT:  A 70 year old with a history of inflammatory bowel disease, now with increased temperature, diarrhea, vomiting, abdominal pain of questionable etiology.  Will check a CT scan  to rule out appendicitis and consult general surgery versus GI consult depending on the findings.  Will recheck CBC and CMP.  Will start PPI IV, will add Flagyl to her Rocephin, which was initiated in the emergency department, and will resume her home medications. DD:  10/21/99 TD:  10/22/99 Job: [id number]

## 2010-08-29 ENCOUNTER — Other Ambulatory Visit: Payer: Self-pay | Admitting: Oncology

## 2010-08-29 ENCOUNTER — Encounter (HOSPITAL_BASED_OUTPATIENT_CLINIC_OR_DEPARTMENT_OTHER): Payer: Medicare Other | Admitting: Oncology

## 2010-08-29 DIAGNOSIS — Z452 Encounter for adjustment and management of vascular access device: Secondary | ICD-10-CM

## 2010-08-29 DIAGNOSIS — C569 Malignant neoplasm of unspecified ovary: Secondary | ICD-10-CM

## 2010-08-29 LAB — CBC WITH DIFFERENTIAL/PLATELET
BASO%: 0.3 % (ref 0.0–2.0)
EOS%: 2.4 % (ref 0.0–7.0)
LYMPH%: 32.2 % (ref 14.0–49.7)
MCH: 29.4 pg (ref 25.1–34.0)
MCHC: 33.7 g/dL (ref 31.5–36.0)
MCV: 87.4 fL (ref 79.5–101.0)
MONO%: 7 % (ref 0.0–14.0)
NEUT%: 58.1 % (ref 38.4–76.8)
Platelets: 212 10*3/uL (ref 145–400)
RBC: 3.47 10*6/uL — ABNORMAL LOW (ref 3.70–5.45)
WBC: 7.3 10*3/uL (ref 3.9–10.3)

## 2010-08-29 LAB — CA 125: CA 125: 23.2 U/mL (ref 0.0–30.2)

## 2010-10-06 ENCOUNTER — Other Ambulatory Visit (HOSPITAL_COMMUNITY): Payer: Self-pay | Admitting: Family Medicine

## 2010-10-06 DIAGNOSIS — Z1231 Encounter for screening mammogram for malignant neoplasm of breast: Secondary | ICD-10-CM

## 2010-10-24 ENCOUNTER — Ambulatory Visit (HOSPITAL_BASED_OUTPATIENT_CLINIC_OR_DEPARTMENT_OTHER): Payer: Medicare Other | Admitting: Oncology

## 2010-10-24 ENCOUNTER — Other Ambulatory Visit: Payer: Self-pay | Admitting: Oncology

## 2010-10-24 DIAGNOSIS — D649 Anemia, unspecified: Secondary | ICD-10-CM

## 2010-10-24 DIAGNOSIS — C569 Malignant neoplasm of unspecified ovary: Secondary | ICD-10-CM

## 2010-10-24 LAB — CBC WITH DIFFERENTIAL/PLATELET
BASO%: 0.3 % (ref 0.0–2.0)
EOS%: 3.1 % (ref 0.0–7.0)
MCH: 29.7 pg (ref 25.1–34.0)
MCHC: 33.8 g/dL (ref 31.5–36.0)
MCV: 87.9 fL (ref 79.5–101.0)
MONO%: 6 % (ref 0.0–14.0)
NEUT%: 61.7 % (ref 38.4–76.8)
RDW: 14.7 % — ABNORMAL HIGH (ref 11.2–14.5)
lymph#: 2.2 10*3/uL (ref 0.9–3.3)

## 2010-10-24 LAB — COMPREHENSIVE METABOLIC PANEL
AST: 21 U/L (ref 0–37)
Albumin: 3.9 g/dL (ref 3.5–5.2)
Alkaline Phosphatase: 40 U/L (ref 39–117)
BUN: 20 mg/dL (ref 6–23)
Glucose, Bld: 95 mg/dL (ref 70–99)
Potassium: 4.3 mEq/L (ref 3.5–5.3)
Sodium: 137 mEq/L (ref 135–145)
Total Bilirubin: 0.2 mg/dL — ABNORMAL LOW (ref 0.3–1.2)

## 2010-11-06 ENCOUNTER — Ambulatory Visit: Payer: Medicare Other | Attending: Gynecologic Oncology | Admitting: Gynecologic Oncology

## 2010-11-06 DIAGNOSIS — Z79899 Other long term (current) drug therapy: Secondary | ICD-10-CM | POA: Insufficient documentation

## 2010-11-06 DIAGNOSIS — Z9079 Acquired absence of other genital organ(s): Secondary | ICD-10-CM | POA: Insufficient documentation

## 2010-11-06 DIAGNOSIS — Z90710 Acquired absence of both cervix and uterus: Secondary | ICD-10-CM | POA: Insufficient documentation

## 2010-11-06 DIAGNOSIS — C569 Malignant neoplasm of unspecified ovary: Secondary | ICD-10-CM | POA: Insufficient documentation

## 2010-11-11 NOTE — Consult Note (Signed)
  NAMEEMILIE, CARP NO.:  [id number]  MEDICAL RECORD NO.:  [id number]  LOCATION:  GYN                          FACILITY:  [facility]  PHYSICIAN:  Laurette Schimke, MD     DATE OF BIRTH:  08/27/1940  DATE OF CONSULTATION:  11/06/2010 DATE OF DISCHARGE:                                CONSULTATION   REASON FOR VISIT:  Surveillance for stage 3C ovarian cancer.  HISTORY OF PRESENT ILLNESS:  This is a 70 year old with remote history of a vaginal hysterectomy and right salpingo-oophorectomy completed in 1988, pelvic mass was noted in January 2012.  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  She received 6 cycles of Taxol and carboplatin therapy with persistent elevations in her CA-125.  Treatment was complicated by anemia and requirement for multiple blood transfusions. Her CA-125 in September 2011, remained minimally elevated to 40.  On February 24, a trial with tamoxifen was undertaken given her poor tolerance of chemotherapy.  Her CA-125 nadir was 20, most recent value returned with a value of 23.  PAST MEDICAL HISTORY:  No interval changes.  PAST SURGICAL HISTORY:  No interval changes.  REVIEW OF SYSTEMS:  CONSTITUTIONAL:  No nausea, vomiting, fever, chills, headache, shortness of breath, or adenopathy.  No abdominal bloating, anorexia and change in appetite, weight loss.  No bloating.  GI:  No diarrhea, constipation, hematuria, hematochezia, and hemat occult. EXTREMITIES:  No swelling of the lower extremities.  Otherwise, 10-point review of systems is negative.  PHYSICAL EXAMINATION:  GENERAL:  Well-developed female in no acute distress. CHEST:  Clear to auscultation. HEART:  Regular rate and rhythm. ABDOMEN:  Soft, nontender, and obese.  No palpable masses. PELVIC EXAMINATION:  Normal external genitalia;  Bartholin's, urethral, and Skene's.  No masses in the vagina, cul-de-sac, or pelvis. EXTREMITIES:  No clubbing, cyanosis, or edema.  IMPRESSION:  Ms. Wheless is a 70 year old with stage 3C ovarian cancer, currently on tamoxifen with reduction and stabilization of her CA-125 in the 20s.  Recent eye examination in July 2012, was notable for small cataracts, too small to be addressed.  Otherwise, she appears to be doing very well.  She will see Dr Darrold Span as scheduled in November and return to GYN Oncology in February 2013.-     Laurette Schimke, MD  cc Telford Nab, RN    Jama Flavors, MD    Ocie Bob, MD   WB/MEDQ  D:  11/06/2010  T:  11/06/2010  Job:  [id number]  Electronically Signed by Laurette Schimke MD on 11/11/2010 07:01:53 AM

## 2010-11-17 ENCOUNTER — Ambulatory Visit (HOSPITAL_COMMUNITY)
Admission: RE | Admit: 2010-11-17 | Discharge: 2010-11-17 | Disposition: A | Discharge status: Home / Self Care | Payer: Medicare Other | Source: Ambulatory Visit | Attending: Family Medicine | Admitting: Family Medicine

## 2010-11-17 DIAGNOSIS — Z1231 Encounter for screening mammogram for malignant neoplasm of breast: Secondary | ICD-10-CM | POA: Insufficient documentation

## 2010-12-11 IMAGING — CR DG ABDOMEN 2V
2 series · 2 of 2 positions shown · non-contrast
Comparison: CT dated 02/06/2009

CLINICAL DATA: Abdominal distention and pelvic mass.

ABDOMEN - 2 VIEW

[view not recorded (1 of 2)]
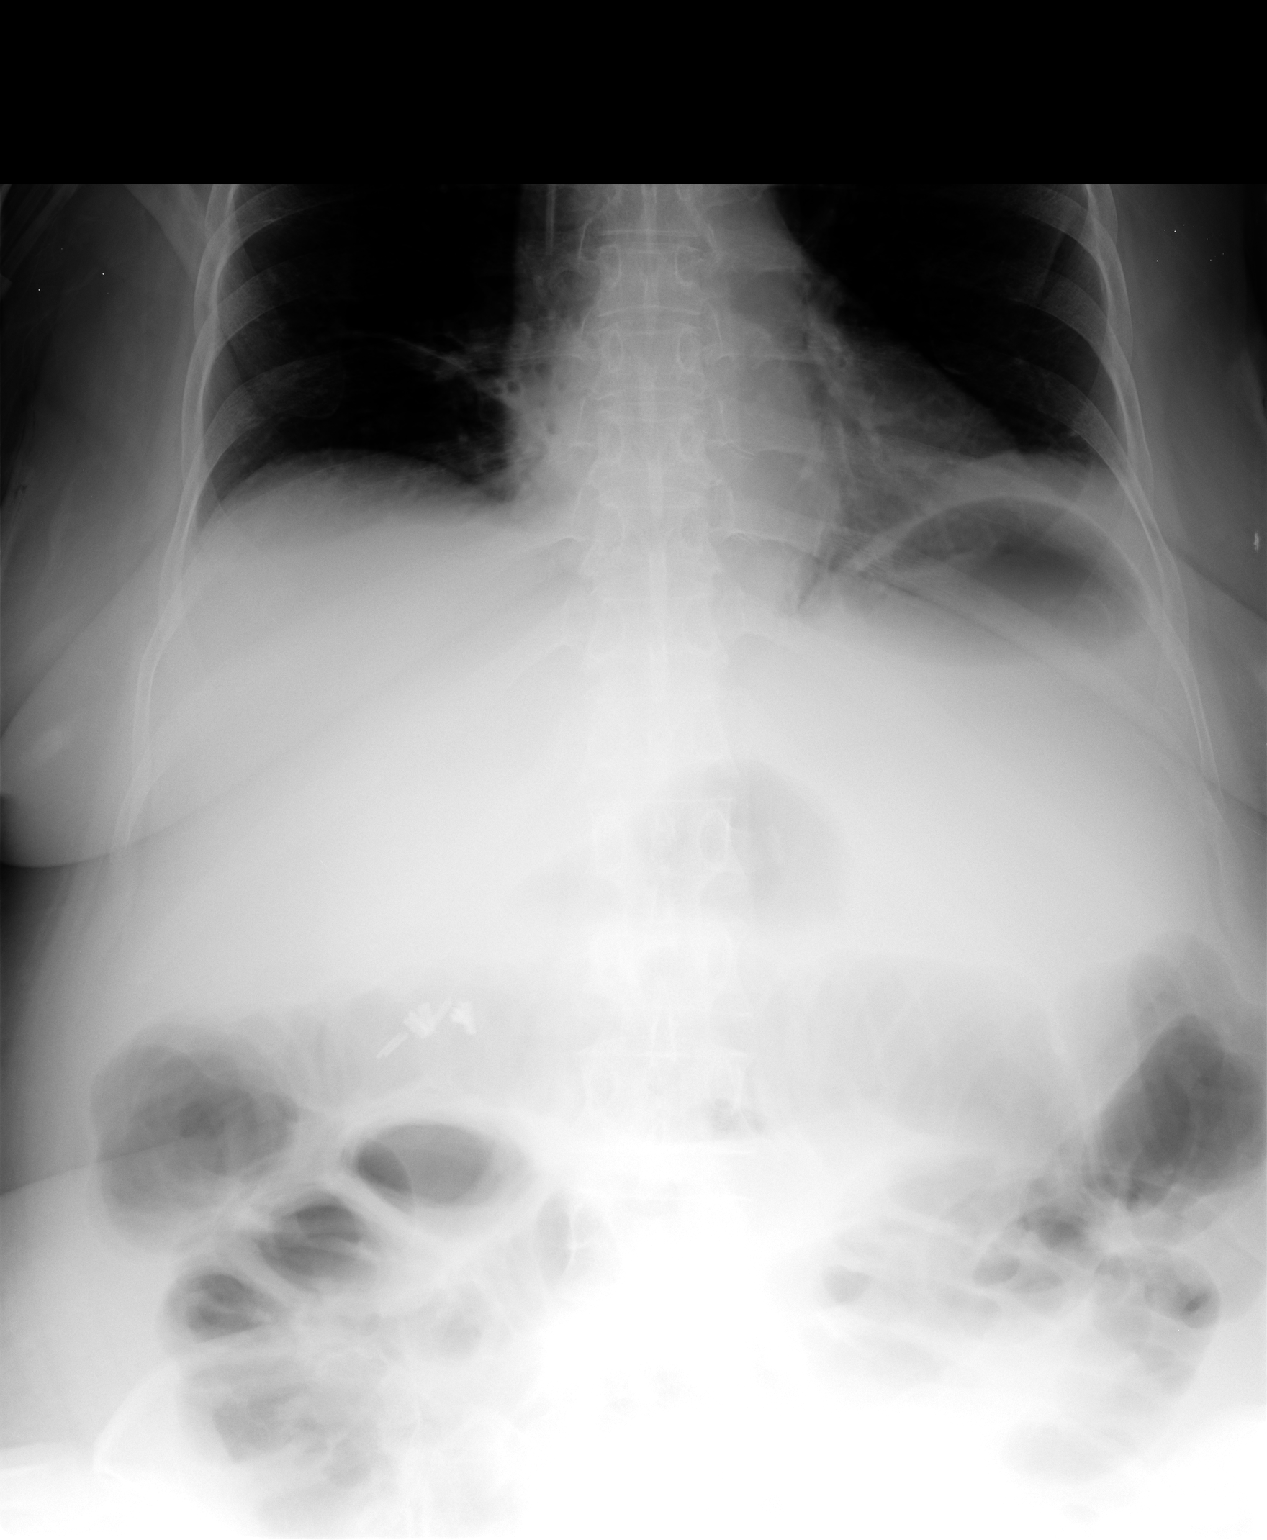

[view not recorded (2 of 2)]
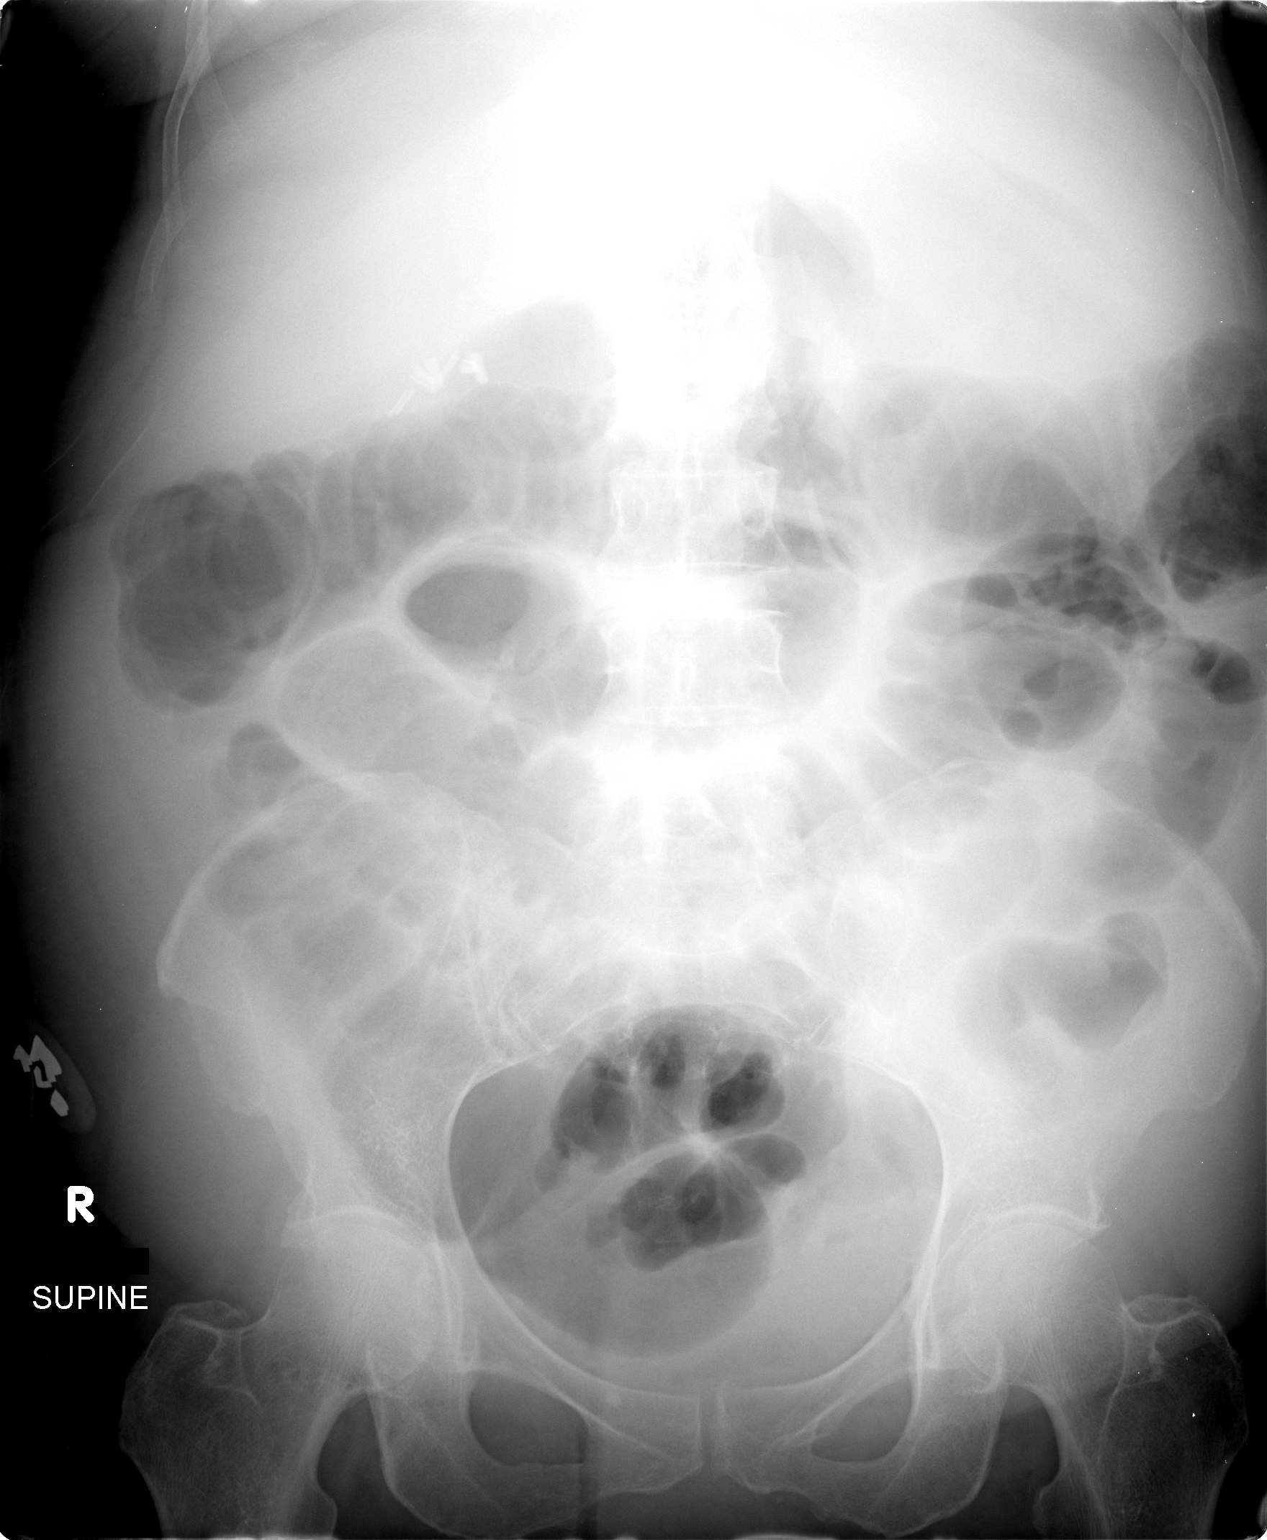

[2 of 2 positions shown; findings below may reference images not displayed]

FINDINGS: Upright and supine views of the abdomen were obtained.
There are surgical clips in the right upper quadrant of the
abdomen.  There are gas filled dilated loops of bowel, some which
could represent small bowel.  No clear evidence for free air.  Lung
bases are difficult to evaluate and there may be right pleural
fluid.  The patient has a central venous catheter that terminates
in the SVC.
IMPRESSION: Dilated loops of small bowel concerning for an obstructive process.

## 2010-12-19 ENCOUNTER — Other Ambulatory Visit: Payer: Self-pay | Admitting: Oncology

## 2010-12-19 ENCOUNTER — Encounter (HOSPITAL_BASED_OUTPATIENT_CLINIC_OR_DEPARTMENT_OTHER): Payer: Medicare Other | Admitting: Oncology

## 2010-12-19 DIAGNOSIS — Z452 Encounter for adjustment and management of vascular access device: Secondary | ICD-10-CM

## 2010-12-19 DIAGNOSIS — C569 Malignant neoplasm of unspecified ovary: Secondary | ICD-10-CM

## 2010-12-19 LAB — COMPREHENSIVE METABOLIC PANEL
AST: 22 U/L (ref 0–37)
Albumin: 3.8 g/dL (ref 3.5–5.2)
Alkaline Phosphatase: 41 U/L (ref 39–117)
BUN: 16 mg/dL (ref 6–23)
Creatinine, Ser: 0.95 mg/dL (ref 0.50–1.10)
Glucose, Bld: 82 mg/dL (ref 70–99)
Total Bilirubin: 0.2 mg/dL — ABNORMAL LOW (ref 0.3–1.2)

## 2010-12-19 LAB — CBC WITH DIFFERENTIAL/PLATELET
Basophils Absolute: 0 10*3/uL (ref 0.0–0.1)
EOS%: 3.6 % (ref 0.0–7.0)
Eosinophils Absolute: 0.3 10*3/uL (ref 0.0–0.5)
HGB: 10.5 g/dL — ABNORMAL LOW (ref 11.6–15.9)
LYMPH%: 31.4 % (ref 14.0–49.7)
MCH: 29.5 pg (ref 25.1–34.0)
MCV: 87.3 fL (ref 79.5–101.0)
MONO%: 5.5 % (ref 0.0–14.0)
NEUT#: 4.3 10*3/uL (ref 1.5–6.5)
NEUT%: 59.2 % (ref 38.4–76.8)
Platelets: 238 10*3/uL (ref 145–400)
RDW: 14.8 % — ABNORMAL HIGH (ref 11.2–14.5)

## 2011-01-10 ENCOUNTER — Other Ambulatory Visit: Payer: Self-pay | Admitting: Oncology

## 2011-01-10 DIAGNOSIS — C569 Malignant neoplasm of unspecified ovary: Secondary | ICD-10-CM

## 2011-01-10 MED ORDER — HEPARIN SOD (PORK) LOCK FLUSH 100 UNIT/ML IV SOLN: 500.0000 [IU] | Freq: Once | INTRAVENOUS | Status: DC

## 2011-01-10 MED ORDER — SODIUM CHLORIDE 0.9 % IJ SOLN: 10.0000 mL | INTRAMUSCULAR | Status: DC

## 2011-01-28 ENCOUNTER — Other Ambulatory Visit: Payer: Self-pay | Admitting: Oncology

## 2011-01-28 DIAGNOSIS — C569 Malignant neoplasm of unspecified ovary: Secondary | ICD-10-CM

## 2011-01-28 MED ORDER — HEPARIN SOD (PORK) LOCK FLUSH 100 UNIT/ML IV SOLN
500.0000 [IU] | Freq: Once | INTRAVENOUS | Status: DC
  Filled 2011-01-28: qty 5

## 2011-01-28 MED ORDER — SODIUM CHLORIDE 0.9 % IJ SOLN
10.0000 mL | INTRAMUSCULAR | Status: DC | PRN
  Filled 2011-01-28: qty 10

## 2011-01-29 ENCOUNTER — Other Ambulatory Visit: Payer: Self-pay | Admitting: Oncology

## 2011-01-29 DIAGNOSIS — C569 Malignant neoplasm of unspecified ovary: Secondary | ICD-10-CM | POA: Insufficient documentation

## 2011-01-29 MED ORDER — SODIUM CHLORIDE 0.9 % IJ SOLN
10.0000 mL | INTRAMUSCULAR | Status: DC | PRN
  Filled 2011-01-29: qty 10

## 2011-01-29 MED ORDER — HEPARIN SOD (PORK) LOCK FLUSH 100 UNIT/ML IV SOLN
500.0000 [IU] | Freq: Once | INTRAVENOUS | Status: DC
  Filled 2011-01-29: qty 5

## 2011-01-30 ENCOUNTER — Ambulatory Visit (HOSPITAL_BASED_OUTPATIENT_CLINIC_OR_DEPARTMENT_OTHER): Payer: Medicare Other | Admitting: Oncology

## 2011-01-30 ENCOUNTER — Other Ambulatory Visit: Payer: Self-pay | Admitting: Oncology

## 2011-01-30 ENCOUNTER — Ambulatory Visit: Payer: Medicare Other

## 2011-01-30 ENCOUNTER — Other Ambulatory Visit (HOSPITAL_BASED_OUTPATIENT_CLINIC_OR_DEPARTMENT_OTHER): Payer: Medicare Other | Admitting: Lab

## 2011-01-30 ENCOUNTER — Telehealth: Payer: Self-pay | Admitting: Oncology

## 2011-01-30 VITALS — BP 131/77 | HR 80 | Temp 97.6°F | Wt 198.0 lb

## 2011-01-30 DIAGNOSIS — D649 Anemia, unspecified: Secondary | ICD-10-CM

## 2011-01-30 DIAGNOSIS — C569 Malignant neoplasm of unspecified ovary: Secondary | ICD-10-CM

## 2011-01-30 DIAGNOSIS — Z170 Estrogen receptor positive status [ER+]: Secondary | ICD-10-CM

## 2011-01-30 LAB — CBC WITH DIFFERENTIAL/PLATELET
EOS%: 3.1 % (ref 0.0–7.0)
HGB: 10 g/dL — ABNORMAL LOW (ref 11.6–15.9)
MCH: 28.2 pg (ref 25.1–34.0)
MCV: 85.6 fL (ref 79.5–101.0)
MONO%: 5 % (ref 0.0–14.0)
NEUT#: 5.1 10*3/uL (ref 1.5–6.5)
RBC: 3.55 10*6/uL — ABNORMAL LOW (ref 3.70–5.45)
RDW: 15 % — ABNORMAL HIGH (ref 11.2–14.5)
lymph#: 3 10*3/uL (ref 0.9–3.3)

## 2011-01-30 LAB — COMPREHENSIVE METABOLIC PANEL
Albumin: 3.8 g/dL (ref 3.5–5.2)
Alkaline Phosphatase: 44 U/L (ref 39–117)
BUN: 22 mg/dL (ref 6–23)
Glucose, Bld: 68 mg/dL — ABNORMAL LOW (ref 70–99)
Potassium: 4.7 mEq/L (ref 3.5–5.3)
Total Bilirubin: 0.2 mg/dL — ABNORMAL LOW (ref 0.3–1.2)

## 2011-01-30 LAB — CA 125: CA 125: 47.2 U/mL — ABNORMAL HIGH (ref 0.0–30.2)

## 2011-01-30 MED ORDER — HEPARIN SOD (PORK) LOCK FLUSH 100 UNIT/ML IV SOLN
500.0000 [IU] | Freq: Once | INTRAVENOUS | Status: DC
  Filled 2011-01-30: qty 5

## 2011-01-30 MED ORDER — SODIUM CHLORIDE 0.9 % IJ SOLN
10.0000 mL | INTRAMUSCULAR | Status: DC | PRN
  Filled 2011-01-30: qty 10

## 2011-01-30 NOTE — Telephone Encounter (Signed)
gve the pt her dec,feb,may 2013 appt calendars. Pt is aware i will contact her with the appt to see dr Nelly Rout. Selena Batten was out of the office the day the pt was in the office.

## 2011-02-01 NOTE — Progress Notes (Signed)
Office Progress Note Date of visit Nov.9, 2012  Patient is seen, alone for visit today, in scheduled follow up of her ovarian carcinoma, continuing treatment with tamoxifen which was begun on Oct. 2011. She has PAC in place which  History is of IIIC endometroid adenocarcinoma of ovary diagnosed in January 2011, when she presented with ascites and CA125 of 1660. She had optimal debulking by Dr.Brewster, 10 liters of ascites at that procedure. She was treated with 8 cycles of taxol/carboplatin thru Aug. 2011, with CA125 still ~ 40 at completion of the chemotherapy. The tumor was ER + at 100% and she has continued tamoxifen as above. CA125 nadir was 20 in April 2012 and was 29 in Aug.2012, then 35 sept 28, 2012.  She saw Dr.Brewster in Aug, 2012 and will see her again in Feb.2013.  Patient tells me that she has been feeling entirely well since she was last here, other than a respiratory infection about a month ago that has resolved other than a minimally productive cough ongoing. She did not see a physician or use medications other than Robitussin for the respiratory infection. The cough is most noticible when she lies down, productive of clear to whitish sputum in small amounts, not associated with SOB, fever or chest pain. She has past hx of environmental allergies and previously used allegra with good results, but has not used allegra at all in past several months. She denies abd or pelvic discomfort, problems with bowels or bladder, bleeding, abd swelling, nausea or vomiting, other pain or any neurologic symptoms.  Appetite is good. She has had no problems with the PAC.  Review of Systems as above, otherwise full 10 point ROS negative.  Physical Exam:  Alert, appropriate, easily mobile and looks comfortable. No cough during entirety of this visit. 97.6, 80/regular, 18/ not labored RA, 131/77. I believe weight stable at 197 (tho this entered as 127?). She is wearing wig. PERRL. Poorly fitting dentures.  Oral mucosa clear and moist, posterior pharynx with dull erythema bilaterally without exudate, suggesting post-nasal drainage. Lungs clear to A&P anteriorly and posteriorly. PAC site ok, flushed today. Cor RRR. Abd soft and NT , normally active BS, no obvious fluid, no appreciable HSM or masses. No peripheral adenopathy including supraclavicular or inguinal. LE without edema, cords, tenderness. Skin not remarkable. Neuro nonfocal.  Labs today: WBC 8.8, ANC 5.1, Hgb 10, plt 242 (Hgb 10.5 in Sept)   CMET: normal except glucose 68 and Tb 0.2, including BUN 22 and creat 0.8.  CA125 available after visit up to 47.  Imaging: Last CT AP was July 2011.   Mammograms at [facility] Aug.28, 2012.  Impression/ Plan 1. IIIC ovarian carcinoma with history as above, now gradual rise in CA125 without obvious symptoms. We will be back in touch with patient to schedule repeat CT scan, to be followed with visit back either to gyn onc or to this office depending on appointment availability. 2. Mild anemia: hgb a little lower but does not seem symptomatic. During chemotherapy she did require PRBCs, which was of concern to her daughters who are Jehovah's Witnesses. 3. PAC in place 4. Long history of anxiety/depression/? Other, followed by psychiatry. 5. History of HTN, borderline DM and IBS  Prior to CA125 resulting higher, patient was secheduled for [facility] flush Mar 05, 2011, appt with Dr.Brewster requested for Feb, 2013, [facility] flush with cbc/bmet/ca125 Apr 24, 2011, [facility] flush June 12, 2011 and visit with Dr.Livesay + PAC flush with lab from [facility] (CBC, CMET,  iron/IBC,  CA125) from [facility] Aug 07, 2011.

## 2011-02-05 ENCOUNTER — Telehealth: Payer: Self-pay | Admitting: *Deleted

## 2011-02-05 ENCOUNTER — Other Ambulatory Visit (HOSPITAL_COMMUNITY): Payer: Self-pay | Admitting: Oncology

## 2011-02-05 DIAGNOSIS — C569 Malignant neoplasm of unspecified ovary: Secondary | ICD-10-CM

## 2011-02-10 ENCOUNTER — Telehealth: Payer: Self-pay | Admitting: Oncology

## 2011-02-10 NOTE — Telephone Encounter (Signed)
S/w the pt and she is aware of the ct scan appt on 02/17/2011 and to stop by the scheduling desk to pick up her appt calendar along with the ct scan instructions. Per pt she stated that the appt with dr Nelly Rout has been cancelled. lmonvm with the desk nurse regarding this.

## 2011-02-17 ENCOUNTER — Ambulatory Visit (HOSPITAL_COMMUNITY)
Admission: RE | Admit: 2011-02-17 | Discharge: 2011-02-17 | Disposition: A | Discharge status: Home / Self Care | Payer: Medicare Other | Source: Ambulatory Visit | Attending: Oncology | Admitting: Oncology

## 2011-02-17 ENCOUNTER — Encounter (HOSPITAL_COMMUNITY): Payer: Self-pay

## 2011-02-17 ENCOUNTER — Other Ambulatory Visit: Payer: Medicare Other | Admitting: Lab

## 2011-02-17 DIAGNOSIS — K7689 Other specified diseases of liver: Secondary | ICD-10-CM | POA: Insufficient documentation

## 2011-02-17 DIAGNOSIS — I700 Atherosclerosis of aorta: Secondary | ICD-10-CM | POA: Insufficient documentation

## 2011-02-17 DIAGNOSIS — I889 Nonspecific lymphadenitis, unspecified: Secondary | ICD-10-CM | POA: Insufficient documentation

## 2011-02-17 DIAGNOSIS — R599 Enlarged lymph nodes, unspecified: Secondary | ICD-10-CM | POA: Insufficient documentation

## 2011-02-17 DIAGNOSIS — I708 Atherosclerosis of other arteries: Secondary | ICD-10-CM | POA: Insufficient documentation

## 2011-02-17 DIAGNOSIS — C569 Malignant neoplasm of unspecified ovary: Secondary | ICD-10-CM | POA: Insufficient documentation

## 2011-02-17 DIAGNOSIS — R188 Other ascites: Secondary | ICD-10-CM | POA: Insufficient documentation

## 2011-02-17 MED ORDER — IOHEXOL 300 MG/ML  SOLN
100.0000 mL | Freq: Once | INTRAMUSCULAR | Status: AC | PRN
  Administered 2011-02-17: 100 mL via INTRAVENOUS

## 2011-02-18 ENCOUNTER — Telehealth: Payer: Self-pay | Admitting: Oncology

## 2011-02-18 NOTE — Telephone Encounter (Signed)
I received epp application without proof of income. Mailed income verification letter today.

## 2011-02-19 ENCOUNTER — Telehealth: Payer: Self-pay | Admitting: *Deleted

## 2011-02-19 ENCOUNTER — Ambulatory Visit: Payer: Medicare Other | Admitting: Gynecologic Oncology

## 2011-02-20 ENCOUNTER — Other Ambulatory Visit: Payer: Medicare Other | Admitting: Lab

## 2011-02-20 ENCOUNTER — Encounter: Payer: Medicare Other | Admitting: Oncology

## 2011-02-23 ENCOUNTER — Telehealth: Payer: Self-pay | Admitting: Oncology

## 2011-02-23 ENCOUNTER — Telehealth: Payer: Self-pay

## 2011-02-23 NOTE — Telephone Encounter (Signed)
S/w the pt and she is aware of her pet scan appt in dec

## 2011-02-23 NOTE — Telephone Encounter (Signed)
SPOKE WITH Jaclyn [name] AND TOLD HER CT SCAN 02-05-11 DID NOT SHOW OBVIOUS CANCER RECURRENCE, BUT DR. BREWSTER RECOMMENDS GETTING A PET SCAN TO BE SURE. PT. VERBALIZED UNDERSTANDING. REVIEWED APPT.S WITH PT. FOR LAB 03-05-11 WITH PAC FLUSH ; 12-28 FOR PET SCAN AND 04-02-10 AT 9-15 FOR LAB, FLUSH AND VISIT.

## 2011-02-23 NOTE — Telephone Encounter (Signed)
lmonvm advising the pt to pick up her jan 2013 appt schedules if she does  ot have them whrn she comes in for her dec appt

## 2011-03-05 ENCOUNTER — Ambulatory Visit (HOSPITAL_BASED_OUTPATIENT_CLINIC_OR_DEPARTMENT_OTHER): Payer: Medicare Other

## 2011-03-05 ENCOUNTER — Other Ambulatory Visit (HOSPITAL_BASED_OUTPATIENT_CLINIC_OR_DEPARTMENT_OTHER): Payer: Medicare Other

## 2011-03-05 ENCOUNTER — Other Ambulatory Visit: Payer: Self-pay | Admitting: Oncology

## 2011-03-05 DIAGNOSIS — C569 Malignant neoplasm of unspecified ovary: Secondary | ICD-10-CM

## 2011-03-05 DIAGNOSIS — D649 Anemia, unspecified: Secondary | ICD-10-CM

## 2011-03-05 LAB — CBC WITH DIFFERENTIAL/PLATELET
Basophils Absolute: 0 10*3/uL (ref 0.0–0.1)
HCT: 30.3 % — ABNORMAL LOW (ref 34.8–46.6)
HGB: 10.3 g/dL — ABNORMAL LOW (ref 11.6–15.9)
MCH: 29.5 pg (ref 25.1–34.0)
MCHC: 34 g/dL (ref 31.5–36.0)
MONO%: 5.4 % (ref 0.0–14.0)
Platelets: 232 10*3/uL (ref 145–400)

## 2011-03-05 LAB — COMPREHENSIVE METABOLIC PANEL
AST: 22 U/L (ref 0–37)
Albumin: 3.8 g/dL (ref 3.5–5.2)
Alkaline Phosphatase: 44 U/L (ref 39–117)
Glucose, Bld: 102 mg/dL — ABNORMAL HIGH (ref 70–99)
Potassium: 4.3 mEq/L (ref 3.5–5.3)
Sodium: 138 mEq/L (ref 135–145)
Total Protein: 7.1 g/dL (ref 6.0–8.3)

## 2011-03-05 MED ORDER — SODIUM CHLORIDE 0.9 % IJ SOLN
10.0000 mL | INTRAMUSCULAR | Status: DC | PRN
  Administered 2011-03-05: 10 mL via INTRAVENOUS
  Filled 2011-03-05: qty 10

## 2011-03-05 MED ORDER — HEPARIN SOD (PORK) LOCK FLUSH 100 UNIT/ML IV SOLN
500.0000 [IU] | Freq: Once | INTRAVENOUS | Status: AC
  Administered 2011-03-05: 500 [IU] via INTRAVENOUS
  Filled 2011-03-05: qty 5

## 2011-03-05 NOTE — Patient Instructions (Signed)
Call MD for problems 

## 2011-03-11 ENCOUNTER — Telehealth: Payer: Self-pay | Admitting: Oncology

## 2011-03-11 NOTE — Telephone Encounter (Signed)
Patient approved for 95% discount 03/11/11 to 03/10/12

## 2011-03-12 ENCOUNTER — Telehealth: Payer: Self-pay | Admitting: *Deleted

## 2011-03-12 NOTE — Telephone Encounter (Signed)
INFORMED PT. NOTHING TO EAT OR DRINK SIX HOURS BEFORE PET SCAN WHICH WOULD BE AFTER 4:00AM. ALSO PT. TO BE AT [facility] AT 8:30AM. PT. VOICES UNDERSTANDING.

## 2011-03-20 ENCOUNTER — Encounter (HOSPITAL_COMMUNITY): Payer: Self-pay

## 2011-03-20 ENCOUNTER — Encounter (HOSPITAL_COMMUNITY)
Admission: RE | Admit: 2011-03-20 | Discharge: 2011-03-20 | Disposition: A | Discharge status: Home / Self Care | Payer: Medicare Other | Source: Ambulatory Visit | Attending: Oncology | Admitting: Oncology

## 2011-03-20 DIAGNOSIS — K7689 Other specified diseases of liver: Secondary | ICD-10-CM | POA: Insufficient documentation

## 2011-03-20 DIAGNOSIS — I517 Cardiomegaly: Secondary | ICD-10-CM | POA: Insufficient documentation

## 2011-03-20 DIAGNOSIS — C569 Malignant neoplasm of unspecified ovary: Secondary | ICD-10-CM | POA: Insufficient documentation

## 2011-03-20 LAB — GLUCOSE, CAPILLARY: Glucose-Capillary: 94 mg/dL (ref 70–99)

## 2011-03-20 MED ORDER — FLUDEOXYGLUCOSE F - 18 (FDG) INJECTION
18.1000 | Freq: Once | INTRAVENOUS | Status: AC | PRN
  Administered 2011-03-20: 18.1 via INTRAVENOUS

## 2011-03-31 ENCOUNTER — Other Ambulatory Visit: Payer: Medicare Other | Admitting: Lab

## 2011-04-03 ENCOUNTER — Ambulatory Visit (HOSPITAL_BASED_OUTPATIENT_CLINIC_OR_DEPARTMENT_OTHER): Payer: PRIVATE HEALTH INSURANCE

## 2011-04-03 ENCOUNTER — Other Ambulatory Visit: Payer: Self-pay | Admitting: *Deleted

## 2011-04-03 ENCOUNTER — Telehealth: Payer: Self-pay | Admitting: Oncology

## 2011-04-03 ENCOUNTER — Ambulatory Visit (HOSPITAL_BASED_OUTPATIENT_CLINIC_OR_DEPARTMENT_OTHER): Payer: Medicare Other | Admitting: Oncology

## 2011-04-03 ENCOUNTER — Other Ambulatory Visit (HOSPITAL_BASED_OUTPATIENT_CLINIC_OR_DEPARTMENT_OTHER): Payer: PRIVATE HEALTH INSURANCE | Admitting: Lab

## 2011-04-03 VITALS — BP 126/74 | HR 72 | Temp 97.4°F | Ht 64.0 in | Wt 162.0 lb

## 2011-04-03 DIAGNOSIS — Z452 Encounter for adjustment and management of vascular access device: Secondary | ICD-10-CM

## 2011-04-03 DIAGNOSIS — C569 Malignant neoplasm of unspecified ovary: Secondary | ICD-10-CM

## 2011-04-03 DIAGNOSIS — D649 Anemia, unspecified: Secondary | ICD-10-CM

## 2011-04-03 DIAGNOSIS — I10 Essential (primary) hypertension: Secondary | ICD-10-CM

## 2011-04-03 LAB — CBC WITH DIFFERENTIAL/PLATELET
BASO%: 0.4 % (ref 0.0–2.0)
Eosinophils Absolute: 0.2 10*3/uL (ref 0.0–0.5)
LYMPH%: 28.6 % (ref 14.0–49.7)
MONO#: 0.4 10*3/uL (ref 0.1–0.9)
NEUT#: 5.1 10*3/uL (ref 1.5–6.5)
Platelets: 232 10*3/uL (ref 145–400)
RBC: 3.5 10*6/uL — ABNORMAL LOW (ref 3.70–5.45)
RDW: 15.3 % — ABNORMAL HIGH (ref 11.2–14.5)
WBC: 8 10*3/uL (ref 3.9–10.3)
lymph#: 2.3 10*3/uL (ref 0.9–3.3)

## 2011-04-03 LAB — CA 125: CA 125: 48.6 U/mL — ABNORMAL HIGH (ref 0.0–30.2)

## 2011-04-03 MED ORDER — SODIUM CHLORIDE 0.9 % IJ SOLN
10.0000 mL | INTRAMUSCULAR | Status: DC | PRN
  Administered 2011-04-03: 10 mL via INTRAVENOUS
  Filled 2011-04-03: qty 10

## 2011-04-03 MED ORDER — HEPARIN SOD (PORK) LOCK FLUSH 100 UNIT/ML IV SOLN
500.0000 [IU] | Freq: Once | INTRAVENOUS | Status: AC
  Administered 2011-04-03: 500 [IU] via INTRAVENOUS
  Filled 2011-04-03: qty 5

## 2011-04-03 NOTE — Telephone Encounter (Signed)
appt made for 2/22 per dr ll for lab/flush and md and lab flush and dr Nelly Rout for 4/11,printed for pt   aom

## 2011-04-05 NOTE — Progress Notes (Signed)
OFFICE PROGRESS NOTE  Date of Visit  Apr 02, 2010  Physicians: W.Brewster, V.Bland, J.Mann, K.Reddy  INTERVAL HISTORY:   Patient is seen, alone for visit, in continuing attention to her ovarian carcinoma, now on tamoxifen since Oct 2011.  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 She had optimal debulking by Dr.Brewster, 10 liters of ascites at that procedure. She was treated with 8 cycles of taxol/carboplatin thru Aug. 2011, with CA125 still ~ 40 at completion of the chemotherapy. The tumor was ER + at 100% and she has continued tamoxifen as above. CA125 nadir was 20 in April 2012; this was 43 in Sept 2012, 47 in Nov and 45.6 in mid Dec. She is to see Dr.Brewster again in April, which we will set up. Patient had CT AP 02-17-11 which showed similar calcified omental nodules and adenopathy compared with scan of 7-201, with tiny perihepatic and perisplenic fluid and new fatty infiltration of liver (significant weight gain in interim), then PET on 03-20-11 which had no uptake in abdomen or pelvis including any of the calcified nodes, and no clear abnormal uptake in chest. I have discussed scans with patient now, telling her that the disease appears to be stable and not active now (despite persistent low elevation of marker).  The patient tells me that she has been feeling well since she was last here. Energy is good and she is enjoying her 73 yo and 73 yo grandchildren and attending adult day care several days weekly. She has had no recent fever or symptoms of infection, did have flu vaccine. She denies abdominal or pelvic discomfort, change in bowels or bladder, swelling in legs, any bleeding, increased SOB or chest pain. Remainder of full 10 point Review of Systems negative.  She is to see Dr.Bland for regular visit next month.  Patient has PAC in, flushed with lab draw today. I have reworked flush appointments as  these were not correct dates.         Objective:  Vital signs in last 24 hours:  BP 126/74  Pulse 72  Temp(Src) 97.4 F (36.3 C) (Oral)  Ht 5\' 4"  (1.626 m)  Wt 162 lb (73.483 kg)  BMI 27.81 kg/m2 Weights have been entered incorrectly on prior visit and I believe this is correct as above. Patient does not appear to have changed weight significantly since I saw her last.   HEENT:mucous membranes moist, pharynx normal without lesions. Dentures in. PERRL. LymphaticsCervical, supraclavicular, and axillary nodes normal.No inguinal adenopathy Resp: clear to auscultation bilaterally and normal percussion bilaterally Cardio: regular rate and rhythm GI: soft, non-tender; bowel sounds normal; no masses,  no organomegaly. Not distended. Extremities: extremities normal, atraumatic, no cyanosis or edema Neuro:no focal deficits. Skin not remarkable Breasts without dominant masses, skin changes, nipple discharge Portacath -without erythema or tenderness  Lab Results:   Basename 04/03/11 0918  WBC 8.0  HGB 10.1*  HCT 30.6*  PLT 232  ANC 5.1.   This hemoglobin is in her usual range  BMET not repeated  CA 125 resulted after visit  48.6,  Priors as above.  Studies/Results:  CT AP 02-17-11  And PET 03-20-11 as discussed above  Medications: I have reviewed the patient's current medications.  Assessment/Plan:  1. IIIC ovarian carcinoma with history as above, gradually increasing ca125 but unremarkable PET and stable CT recently, clinically doing well. I will see her again late February coordinating with PAC flush,  and she will see Dr.Brewster in April. Continue tamoxifen 2. Stable mild anemia 3. PAC in 4. History of anxiety/depression, followed by psychiatry 5. HTN, borderline DM and IBS  Patient was comfortable with discussion and in agreement with plan.       Arlon Bleier P, MD   04/05/2011, 7:09 PM

## 2011-04-21 ENCOUNTER — Telehealth: Payer: Self-pay

## 2011-04-21 NOTE — Telephone Encounter (Signed)
SPOKE WITH MS. Ohlendorf AND VERIFIED THAT HER APPT. WAS 05-15-11 AT 1000 NOT 05-04-11.  PT. VERBALIZED UNDERSTANDING AND WROTE APPT. DOWN ON CALENDAR.

## 2011-04-24 ENCOUNTER — Other Ambulatory Visit: Payer: Medicare Other | Admitting: Lab

## 2011-04-24 ENCOUNTER — Ambulatory Visit: Payer: Medicare Other | Admitting: Oncology

## 2011-05-15 ENCOUNTER — Other Ambulatory Visit: Payer: Self-pay | Admitting: Lab

## 2011-05-15 ENCOUNTER — Ambulatory Visit: Payer: Self-pay | Admitting: Oncology

## 2011-05-18 NOTE — Progress Notes (Signed)
This encounter was created in error - please disregard.

## 2011-05-29 ENCOUNTER — Ambulatory Visit (HOSPITAL_BASED_OUTPATIENT_CLINIC_OR_DEPARTMENT_OTHER): Payer: PRIVATE HEALTH INSURANCE | Admitting: Oncology

## 2011-05-29 ENCOUNTER — Other Ambulatory Visit (HOSPITAL_BASED_OUTPATIENT_CLINIC_OR_DEPARTMENT_OTHER): Payer: PRIVATE HEALTH INSURANCE | Admitting: Lab

## 2011-05-29 ENCOUNTER — Telehealth: Payer: Self-pay | Admitting: Oncology

## 2011-05-29 ENCOUNTER — Ambulatory Visit (HOSPITAL_BASED_OUTPATIENT_CLINIC_OR_DEPARTMENT_OTHER): Payer: PRIVATE HEALTH INSURANCE

## 2011-05-29 VITALS — BP 121/68 | HR 75 | Temp 97.4°F | Ht 64.0 in | Wt 194.5 lb

## 2011-05-29 DIAGNOSIS — I10 Essential (primary) hypertension: Secondary | ICD-10-CM

## 2011-05-29 DIAGNOSIS — C569 Malignant neoplasm of unspecified ovary: Secondary | ICD-10-CM

## 2011-05-29 DIAGNOSIS — Z469 Encounter for fitting and adjustment of unspecified device: Secondary | ICD-10-CM

## 2011-05-29 DIAGNOSIS — D649 Anemia, unspecified: Secondary | ICD-10-CM

## 2011-05-29 LAB — BASIC METABOLIC PANEL
Calcium: 9.1 mg/dL (ref 8.4–10.5)
Sodium: 138 mEq/L (ref 135–145)

## 2011-05-29 LAB — CA 125: CA 125: 55.9 U/mL — ABNORMAL HIGH (ref 0.0–30.2)

## 2011-05-29 MED ORDER — SODIUM CHLORIDE 0.9 % IJ SOLN
10.0000 mL | INTRAMUSCULAR | Status: DC | PRN
  Administered 2011-05-29: 10 mL via INTRAVENOUS
  Filled 2011-05-29: qty 10

## 2011-05-29 MED ORDER — HEPARIN SOD (PORK) LOCK FLUSH 100 UNIT/ML IV SOLN
500.0000 [IU] | Freq: Once | INTRAVENOUS | Status: AC
  Administered 2011-05-29: 500 [IU] via INTRAVENOUS
  Filled 2011-05-29: qty 5

## 2011-05-29 NOTE — Telephone Encounter (Signed)
gv pt appt for [id number]

## 2011-05-29 NOTE — Progress Notes (Signed)
OFFICE PROGRESS NOTE Date of Visit 05-29-2011 Physicians: W.Brewster, V.Bland, J.Mann, K.Reddy  INTERVAL HISTORY:  Patient is seen, alone for visit, in scheduled follow up of her ovarian carcinoma, on tamoxifen since Oct 2011. 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 She had optimal debulking by Dr.Brewster, 10 liters of ascites at that procedure. She was treated with 8 cycles of taxol/carboplatin thru Aug. 2011, with CA125 still ~ 40 at completion of the chemotherapy. The tumor was ER + at 100% and she has continued tamoxifen as above. CA125 nadir was 20 in April 2012 and was 29 in Aug.2012, then 35 sept 2012, 45 in Dec 2012 and 48 in Jan. Last scans were PET and CT AP in Nov 2012. She saw Dr.Brewster in Jan 2013 and is scheduled back to her in April.  Patient tells me that she has been feeling entirely well, with good energy, no abdominal or pelvic discomfort, no problems with bowels or bladder, good appetite, no shortness of breath, no recent fever or symptoms of infection, no problems with PAC (this flushed today). Remainder of full 10 point Review of Systems negative. Objective:  Vital signs in last 24 hours:  BP 121/68  Pulse 75  Temp(Src) 97.4 F (36.3 C) (Oral)  Ht 5\' 4"  (1.626 m)  Wt 194 lb 8 oz (88.225 kg)  BMI 33.39 kg/m2 Weight was 197 in Aug and last weight of 162 apparently was not accurate; patient tells me that she has noticed no change in her weight or in the way that her clothes fit. Easily ambulatory, appears comfortable.   HEENT:mucous membranes moist, pharynx normal without lesions, poorly fitting dentures in. PERRL. No JVD. LymphaticsCervical, supraclavicular, and axillary nodes normal.No inguinal adenopathy Resp: clear to auscultation bilaterally and normal percussion bilaterally Cardio: regular rate and rhythm GI: soft, normally active bowel sounds, nontender. Seems mildly  distended. No appreciable mass or organomegaly Extremities: extremities normal, atraumatic, no cyanosis or edema Neuro:no sensory deficits noted Breasts without dominant masses or other findings of concern. Portacath without erythema or tenderness  Lab Results: Last CBC 04-03-11 with WBC 8, Hgb 10.1 and plt 232 No results found for this basename: WBC:2,HGB:2,HCT:2,PLT:2 in the last 72 hours  BMET  Basename 05/29/11 1112  NA 138  K 4.1  CL 105  CO2 23  GLUCOSE 111*  BUN 24*  CREATININE 1.02  CALCIUM 9.1   CA 125 available after visit 56. Studies/Results:  No results found.  Medications: I have reviewed the patient's current medications.  With further rise in marker and possibly increased abdominal fullness on exam, I will recommend repeat CT prior to Dr.Brewster's upcoming visit.  Assessment/Plan:  1.IIC ovarian carcinoma: history and treatment as above, continuing tamoxifen. Progressive elevation in marker, tho patient does not appear symptomatic. Will arrange CT AP before Dr.Brewster's visit. I will see her back at least June, coordinating with PAC flush, or sooner if needed 2.PAC in, which will need flush ~ early May and midJune if not used otherwise. 3.mild anemia: more problematic during chemotherapy as daughters are Jehovah's Witnesses. 4.history of ? Anxiety/depression, followed by psychiatry 5.HTN, borderline DM and IBS   Jaclyn [name] P, MD   05/29/2011, 9:18 PM

## 2011-05-29 NOTE — Patient Instructions (Signed)
Keep appointment with Dr.Brewster 4-11, but you do not need portacath flush that day

## 2011-05-30 ENCOUNTER — Encounter: Payer: Self-pay | Admitting: Oncology

## 2011-06-01 ENCOUNTER — Telehealth: Payer: Self-pay

## 2011-06-01 NOTE — Telephone Encounter (Signed)
TOLD MS. Prosise THAT DR. Darrold Span WANTS HER TO HAVE A CT SCAN OF HE ABDOMEN  AND PELVIS PRIOR TO HER VISIT WITH DR. Forrestine Him APPT. ON 07-02-11 AS HER CA-125 MARKER WAS A LITTLE HIGHER AT HER VISIT 05-29-11.  PT. VERBALIZED UNDERSTANDING.  TOLD HER THAT OUR SCHEDULERS WILL CALL HER WITH THE APPT.

## 2011-06-03 ENCOUNTER — Telehealth: Payer: Self-pay | Admitting: Oncology

## 2011-06-03 NOTE — Telephone Encounter (Signed)
Talked to pt, gave her appt for lab and CT on 06/26/11,asked pt to pick up oral contrast, NPO 4 hrs prior to CT, asked pt to get calendar on 06/26/11

## 2011-06-22 ENCOUNTER — Telehealth: Payer: Self-pay

## 2011-06-22 NOTE — Telephone Encounter (Signed)
SPOKE WITH MS. Kluttz AND TOLD HER THAT DR. Darrold Span SAID THAT SHE SHOULD CALL HER PCP CONCERNING HER DIARRHEA THAT BEGAN Friday 3-29.  SHE IS AFEBRILE. NO N/V.  SHE HAS BM RIGHT AFTER SHE EATS.  STOOL IS VERY LOOSE AND SOFT.  SHE IS DRINKING PLENTY OF FLUIDS.  TRIED IMODIIUM  BUT STOOLS GOT DARKER.  NO BLOOD NOTED.  SHE WILL CALL DR. BLAND'S OFFICE.

## 2011-06-26 ENCOUNTER — Ambulatory Visit (HOSPITAL_COMMUNITY)
Admission: RE | Admit: 2011-06-26 | Discharge: 2011-06-26 | Disposition: A | Discharge status: Home / Self Care | Payer: Medicare Other | Source: Ambulatory Visit | Attending: Oncology | Admitting: Oncology

## 2011-06-26 ENCOUNTER — Ambulatory Visit (HOSPITAL_BASED_OUTPATIENT_CLINIC_OR_DEPARTMENT_OTHER): Payer: Medicare Other

## 2011-06-26 ENCOUNTER — Other Ambulatory Visit (HOSPITAL_BASED_OUTPATIENT_CLINIC_OR_DEPARTMENT_OTHER): Payer: Medicare Other

## 2011-06-26 ENCOUNTER — Telehealth: Payer: Self-pay

## 2011-06-26 VITALS — BP 120/73 | HR 77 | Temp 98.3°F

## 2011-06-26 DIAGNOSIS — Z9079 Acquired absence of other genital organ(s): Secondary | ICD-10-CM | POA: Insufficient documentation

## 2011-06-26 DIAGNOSIS — Z469 Encounter for fitting and adjustment of unspecified device: Secondary | ICD-10-CM

## 2011-06-26 DIAGNOSIS — C569 Malignant neoplasm of unspecified ovary: Secondary | ICD-10-CM

## 2011-06-26 DIAGNOSIS — Z90710 Acquired absence of both cervix and uterus: Secondary | ICD-10-CM | POA: Insufficient documentation

## 2011-06-26 DIAGNOSIS — K7689 Other specified diseases of liver: Secondary | ICD-10-CM | POA: Insufficient documentation

## 2011-06-26 DIAGNOSIS — Z79899 Other long term (current) drug therapy: Secondary | ICD-10-CM | POA: Insufficient documentation

## 2011-06-26 LAB — CBC WITH DIFFERENTIAL/PLATELET
BASO%: 0.3 % (ref 0.0–2.0)
HCT: 28 % — ABNORMAL LOW (ref 34.8–46.6)
HGB: 9.4 g/dL — ABNORMAL LOW (ref 11.6–15.9)
MCHC: 33.6 g/dL (ref 31.5–36.0)
MONO#: 0.3 10*3/uL (ref 0.1–0.9)
NEUT%: 57.4 % (ref 38.4–76.8)
WBC: 8 10*3/uL (ref 3.9–10.3)
lymph#: 2.8 10*3/uL (ref 0.9–3.3)

## 2011-06-26 LAB — CMP (CANCER CENTER ONLY)
Albumin: 3.2 g/dL — ABNORMAL LOW (ref 3.3–5.5)
Calcium: 8.2 mg/dL (ref 8.0–10.3)
Glucose, Bld: 92 mg/dL (ref 73–118)
Sodium: 139 mEq/L (ref 128–145)
Total Bilirubin: 0.5 mg/dl (ref 0.20–1.60)

## 2011-06-26 MED ORDER — HEPARIN SOD (PORK) LOCK FLUSH 100 UNIT/ML IV SOLN
500.0000 [IU] | Freq: Once | INTRAVENOUS | Status: AC
  Administered 2011-06-26: 500 [IU] via INTRAVENOUS
  Filled 2011-06-26: qty 5

## 2011-06-26 MED ORDER — SODIUM CHLORIDE 0.9 % IJ SOLN
10.0000 mL | INTRAMUSCULAR | Status: DC | PRN
  Administered 2011-06-26: 10 mL via INTRAVENOUS
  Filled 2011-06-26: qty 10

## 2011-06-26 MED ORDER — IOHEXOL 300 MG/ML  SOLN
100.0000 mL | Freq: Once | INTRAMUSCULAR | Status: AC | PRN
  Administered 2011-06-26: 100 mL via INTRAVENOUS

## 2011-06-26 NOTE — Telephone Encounter (Signed)
LM FOR Jaclyn [name] THAT HER POTASSIUM WAS LOW ON TODAYS LAB AT 3.1.  DR. Darrold Span WANTS HER TO INCREASE HER KCL 20 MEQ DAILY TO BID. HER HGB WAS A LITTLE LOWER THAN IN January.  SHE NEEDS TO LET us KNOW IF SHE HAS NOTICED ANY BLEEDING IN HER URINE OR STOOL. WILL REPEAT B-MET WHEN SHE COMES TO SEE DR. BREWSTER 07-02-11.

## 2011-06-26 NOTE — Progress Notes (Signed)
Patient accessed for lab draw and CT scan. Dressing labeled with PowerPort label. Port to be flushed and deaccessed after CT.

## 2011-06-29 ENCOUNTER — Telehealth: Payer: Self-pay

## 2011-06-29 ENCOUNTER — Other Ambulatory Visit: Payer: Self-pay

## 2011-06-29 DIAGNOSIS — C569 Malignant neoplasm of unspecified ovary: Secondary | ICD-10-CM

## 2011-06-29 LAB — CA 125: CA 125: 51.4 U/mL — ABNORMAL HIGH (ref 0.0–30.2)

## 2011-06-29 NOTE — Telephone Encounter (Signed)
SPOKE WITH MS. Lites AND TOLD HER THAT SHE NEEDS TO KEEP TAKING HER KCL 20 MEQ  BID UNTIL REPEAT B-MET ON 07-02-11 AT 1345 PRIOR TO DR. Forrestine Him APPT.  SHE WILL GET HER CT SCAN RESULTS THEN.  NO SIGNS OF BLEEDING NOTED.

## 2011-06-29 NOTE — Telephone Encounter (Signed)
ENCOUNTER OPENED IN ERROR

## 2011-07-02 ENCOUNTER — Other Ambulatory Visit (HOSPITAL_BASED_OUTPATIENT_CLINIC_OR_DEPARTMENT_OTHER): Payer: Medicare Other | Admitting: Lab

## 2011-07-02 ENCOUNTER — Encounter: Payer: Self-pay | Admitting: Gynecologic Oncology

## 2011-07-02 ENCOUNTER — Ambulatory Visit: Payer: Medicare Other | Attending: Gynecologic Oncology | Admitting: Gynecologic Oncology

## 2011-07-02 ENCOUNTER — Other Ambulatory Visit: Payer: PRIVATE HEALTH INSURANCE | Admitting: Lab

## 2011-07-02 VITALS — BP 114/60 | HR 68 | Temp 97.9°F | Resp 14 | Ht 63.98 in | Wt 189.4 lb

## 2011-07-02 DIAGNOSIS — I10 Essential (primary) hypertension: Secondary | ICD-10-CM | POA: Insufficient documentation

## 2011-07-02 DIAGNOSIS — Z9079 Acquired absence of other genital organ(s): Secondary | ICD-10-CM | POA: Insufficient documentation

## 2011-07-02 DIAGNOSIS — Z90710 Acquired absence of both cervix and uterus: Secondary | ICD-10-CM | POA: Insufficient documentation

## 2011-07-02 DIAGNOSIS — C569 Malignant neoplasm of unspecified ovary: Secondary | ICD-10-CM

## 2011-07-02 DIAGNOSIS — E119 Type 2 diabetes mellitus without complications: Secondary | ICD-10-CM | POA: Insufficient documentation

## 2011-07-02 LAB — BASIC METABOLIC PANEL
CO2: 25 mEq/L (ref 19–32)
Calcium: 9.5 mg/dL (ref 8.4–10.5)
Chloride: 104 mEq/L (ref 96–112)
Creatinine, Ser: 1.03 mg/dL (ref 0.50–1.10)
Sodium: 138 mEq/L (ref 135–145)

## 2011-07-02 NOTE — Progress Notes (Signed)
REASON FOR VISIT: Surveillance for stage 3C ovarian cancer.   HISTORY OF PRESENT ILLNESS: This is a 71 year old with remote history  of a vaginal hysterectomy and right salpingo-oophorectomy completed in  1988, pelvic mass was noted in January 2012. 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 She received 6 cycles of Taxol and carboplatin  therapy with persistent elevations in her CA-125. Treatment was  complicated by anemia and requirement for multiple blood transfusions.  Her CA-125 in September 2011, remained minimally elevated to 40. On  May 16, 2010  a trial with tamoxifen was undertaken given her poor  tolerance of chemotherapy. Her CA-125 nadir was 20, the trend in Ca125  Has been progressively increasing to a value of 55.9 in 05/2011.  PET scan 02/2011 with significant hypermetabolism.  CT abd/pelvis  06/2101 without any evidence of disease. The patient states that she feels perfect.  Past Medical History  Diagnosis Date  . Anxiety   . Depression   . Diabetes mellitus     BORDERLINE  . Hypertension   . History of irritable bowel syndrome   . Hx of colonoscopy 0/2010    BY DR. MANN  . Ovarian cancer 04-02-2009    [facility]    Past Surgical History  Procedure Date  . Vaginal hysterectomy 1980  . Tubial ligation   . Tubal ligation 1963    BILATERAL  . Cholecystectomy     DONE ~ 2007   .  REVIEW OF SYSTEMS: CONSTITUTIONAL: No nausea, vomiting, fever, chills,  headache, shortness of breath, or adenopathy. No abdominal bloating,  anorexia and change in appetite, weight loss. No bloating. GI: No  diarrhea, constipation, hematuria, hematochezia, and hemat occult.  No swelling of the lower extremities. Otherwise, 10-point  review of systems is negative.    PHYSICAL EXAMINATION: GENERAL: Well-developed female in no acute    distress.  VITALS: BP 114/60  Pulse 68  Temp(Src) 97.9 F (36.6 C) (Oral)  Resp 14  Ht 5' 3.98" (1.625 m)  Wt 189 lb 6.4 oz (85.911 kg)  BMI 32.53 kg/m2 CHEST: Clear to auscultation.  HEART: Regular rate and rhythm.  ABDOMEN: Soft, nontender, and obese. No palpable masses.  PELVIC EXAMINATION: Normal external genitalia; Bartholin's, urethral,  and Skene's. No masses in the vagina, cul-de-sac, or pelvis.  EXTREMITIES: No clubbing, cyanosis, or edema.   ASSESSMENT/PLAN : Ms. Gasner is a 71 year old with stage 3C ovarian cancer, currently on tamoxifen with reduction and slow increase in her CA-125 to the low 50's without evidence of disease on imaging or symptoms.  The patient is not inclined to continue blood draws or imaging.    She will see Dr Darrold Span as scheduled in June 2013 and return to GYN Oncology in September 2013.

## 2011-07-02 NOTE — Patient Instructions (Signed)
Follow-up with  Dr Darrold Span as scheduled in June 2013 and return to GYN Oncology in September 2013.

## 2011-07-03 ENCOUNTER — Telehealth: Payer: Self-pay

## 2011-07-03 ENCOUNTER — Other Ambulatory Visit: Payer: Self-pay | Admitting: Oncology

## 2011-07-03 NOTE — Telephone Encounter (Signed)
Jaclyn [name]'S KCL LEVEL WAS GOOD AT 4.2 AT REPEAT LAB ON 07-02-11.  SHE TAKES  KCL BIDREGULARLY.  SHE ACTUALLY DID NOT INCREASE HER KCL.  SHE HAD JUST NOT TAKEN IT FOR A FEW DAYS PRIOR TO 06-26-11 LAB DUE TO DIARRHEA.

## 2011-07-24 ENCOUNTER — Other Ambulatory Visit: Payer: Self-pay | Admitting: *Deleted

## 2011-07-24 DIAGNOSIS — C569 Malignant neoplasm of unspecified ovary: Secondary | ICD-10-CM

## 2011-07-24 MED ORDER — LIDOCAINE-PRILOCAINE 2.5-2.5 % EX KIT: 1.0000 "application " | PACK | CUTANEOUS | Status: DC | PRN

## 2011-07-31 ENCOUNTER — Ambulatory Visit (HOSPITAL_BASED_OUTPATIENT_CLINIC_OR_DEPARTMENT_OTHER): Payer: Medicare Other

## 2011-07-31 ENCOUNTER — Other Ambulatory Visit (HOSPITAL_BASED_OUTPATIENT_CLINIC_OR_DEPARTMENT_OTHER): Payer: Medicare Other | Admitting: Lab

## 2011-07-31 VITALS — BP 125/67 | HR 84 | Temp 98.3°F

## 2011-07-31 DIAGNOSIS — C569 Malignant neoplasm of unspecified ovary: Secondary | ICD-10-CM

## 2011-07-31 LAB — CBC WITH DIFFERENTIAL/PLATELET
Basophils Absolute: 0 10*3/uL (ref 0.0–0.1)
EOS%: 3 % (ref 0.0–7.0)
Eosinophils Absolute: 0.3 10*3/uL (ref 0.0–0.5)
HCT: 30.7 % — ABNORMAL LOW (ref 34.8–46.6)
HGB: 10.1 g/dL — ABNORMAL LOW (ref 11.6–15.9)
MCH: 27.8 pg (ref 25.1–34.0)
MCV: 84.6 fL (ref 79.5–101.0)
NEUT#: 5 10*3/uL (ref 1.5–6.5)
NEUT%: 60.3 % (ref 38.4–76.8)
RDW: 14.7 % — ABNORMAL HIGH (ref 11.2–14.5)
lymph#: 2.7 10*3/uL (ref 0.9–3.3)

## 2011-07-31 LAB — COMPREHENSIVE METABOLIC PANEL WITH GFR
ALT: 15 U/L (ref 0–35)
AST: 17 U/L (ref 0–37)
Albumin: 3.8 g/dL (ref 3.5–5.2)
Alkaline Phosphatase: 47 U/L (ref 39–117)
BUN: 21 mg/dL (ref 6–23)
CO2: 24 meq/L (ref 19–32)
Calcium: 9.3 mg/dL (ref 8.4–10.5)
Chloride: 105 meq/L (ref 96–112)
Creatinine, Ser: 0.88 mg/dL (ref 0.50–1.10)
Glucose, Bld: 93 mg/dL (ref 70–99)
Potassium: 4.3 meq/L (ref 3.5–5.3)
Sodium: 138 meq/L (ref 135–145)
Total Bilirubin: 0.3 mg/dL (ref 0.3–1.2)
Total Protein: 7.3 g/dL (ref 6.0–8.3)

## 2011-07-31 LAB — IRON AND TIBC
%SAT: 16 % — ABNORMAL LOW (ref 20–55)
Iron: 43 ug/dL (ref 42–145)
TIBC: 264 ug/dL (ref 250–470)
UIBC: 221 ug/dL (ref 125–400)

## 2011-07-31 LAB — CA 125: CA 125: 67 U/mL — ABNORMAL HIGH (ref 0.0–30.2)

## 2011-07-31 MED ORDER — SODIUM CHLORIDE 0.9 % IJ SOLN
10.0000 mL | INTRAMUSCULAR | Status: DC | PRN
  Administered 2011-07-31: 10 mL via INTRAVENOUS
  Filled 2011-07-31: qty 10

## 2011-07-31 MED ORDER — HEPARIN SOD (PORK) LOCK FLUSH 100 UNIT/ML IV SOLN
500.0000 [IU] | Freq: Once | INTRAVENOUS | Status: AC
  Administered 2011-07-31: 500 [IU] via INTRAVENOUS
  Filled 2011-07-31: qty 5

## 2011-08-07 ENCOUNTER — Other Ambulatory Visit: Payer: Medicare Other | Admitting: Lab

## 2011-09-18 ENCOUNTER — Ambulatory Visit (HOSPITAL_BASED_OUTPATIENT_CLINIC_OR_DEPARTMENT_OTHER): Payer: Medicare Other | Admitting: Oncology

## 2011-09-18 ENCOUNTER — Telehealth: Payer: Self-pay | Admitting: Oncology

## 2011-09-18 ENCOUNTER — Other Ambulatory Visit (HOSPITAL_BASED_OUTPATIENT_CLINIC_OR_DEPARTMENT_OTHER): Payer: Medicare Other | Admitting: Lab

## 2011-09-18 ENCOUNTER — Ambulatory Visit (HOSPITAL_BASED_OUTPATIENT_CLINIC_OR_DEPARTMENT_OTHER): Payer: Medicare Other

## 2011-09-18 VITALS — BP 122/73 | HR 78 | Temp 98.1°F | Ht 64.0 in | Wt 188.7 lb

## 2011-09-18 DIAGNOSIS — C569 Malignant neoplasm of unspecified ovary: Secondary | ICD-10-CM

## 2011-09-18 LAB — COMPREHENSIVE METABOLIC PANEL
Albumin: 4 g/dL (ref 3.5–5.2)
Alkaline Phosphatase: 46 U/L (ref 39–117)
BUN: 19 mg/dL (ref 6–23)
CO2: 24 mEq/L (ref 19–32)
Calcium: 9.5 mg/dL (ref 8.4–10.5)
Chloride: 105 mEq/L (ref 96–112)
Glucose, Bld: 82 mg/dL (ref 70–99)
Potassium: 4.5 mEq/L (ref 3.5–5.3)

## 2011-09-18 LAB — CBC WITH DIFFERENTIAL/PLATELET
Basophils Absolute: 0 10*3/uL (ref 0.0–0.1)
Eosinophils Absolute: 0.3 10*3/uL (ref 0.0–0.5)
HGB: 10.3 g/dL — ABNORMAL LOW (ref 11.6–15.9)
MCV: 86 fL (ref 79.5–101.0)
MONO%: 5 % (ref 0.0–14.0)
NEUT#: 5.4 10*3/uL (ref 1.5–6.5)
Platelets: 230 10*3/uL (ref 145–400)
RDW: 14.9 % — ABNORMAL HIGH (ref 11.2–14.5)

## 2011-09-18 LAB — CA 125: CA 125: 71.8 U/mL — ABNORMAL HIGH (ref 0.0–30.2)

## 2011-09-18 MED ORDER — HEPARIN SOD (PORK) LOCK FLUSH 100 UNIT/ML IV SOLN
500.0000 [IU] | Freq: Once | INTRAVENOUS | Status: AC
  Administered 2011-09-18: 500 [IU] via INTRAVENOUS
  Filled 2011-09-18: qty 5

## 2011-09-18 MED ORDER — SODIUM CHLORIDE 0.9 % IJ SOLN
10.0000 mL | INTRAMUSCULAR | Status: DC | PRN
  Administered 2011-09-18: 10 mL via INTRAVENOUS
  Filled 2011-09-18: qty 10

## 2011-09-18 NOTE — Patient Instructions (Signed)
Will keep portacath flushed every 6-8 weeks while not being used.

## 2011-09-18 NOTE — Telephone Encounter (Signed)
appts made and printed for pt aom °

## 2011-09-18 NOTE — Patient Instructions (Signed)
Call MD for problems 

## 2011-09-19 ENCOUNTER — Encounter: Payer: Self-pay | Admitting: Oncology

## 2011-09-19 NOTE — Progress Notes (Signed)
OFFICE PROGRESS NOTE Date of Visit 09-18-11 Physicians: W.Brewster, V.Bland, J.Mann, K.Reddy  INTERVAL HISTORY:  Patient is seen, alone for visit, in continuing follow up of her ovarian carcinoma, on tamoxifen since Oct 2011 which she is tolerating without difficulty.She has PAC in, which we are flushing every 8 weeks when not used otherwise. Patient would like to keep PAC in for now, especially as peripheral IV access is very difficult. 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 She had optimal debulking by Dr.Brewster, 10 liters of ascites at that procedure. She was treated with 8 cycles of taxol/carboplatin thru Aug. 2011, with CA125 still ~ 40 at completion of the chemotherapy. The tumor was ER + at 100% and she has continued tamoxifen as above. CA125 nadir was 20 in April 2012, with gradual rise since then tho patient has remained asymptomatic. Last imaging was CT AP 06-26-11, with no clear findings of concern. She saw Dr Nelly Rout after that scan and was most comfortable continuing observation. Patient tells me that she has continued to feel entirely well. She has good energy, good appetite, no abdominal or pelvic pain, no shortness of breath, no bleeding, no recent infectious illness. She has no neurologic symptoms, no symptoms of concern for blood clots, no significant hot flashes. Remainder of 10 point Review of Systems negative. Objective:  Vital signs in last 24 hours:  BP 122/73  Pulse 78  Temp 98.1 F (36.7 C) (Oral)  Ht 5\' 4"  (1.626 m)  Wt 188 lb 11.2 oz (85.594 kg)  BMI 32.39 kg/m2 Weight is down 5 lbs. Easily mobile, looks comfortable.  HEENT:mucous membranes moist, pharynx normal without lesions. Dentures in. PERRL, not icteric. LymphaticsCervical, supraclavicular, and axillary nodes normal.No inguinal adenopathy Resp: clear to auscultation bilaterally and normal percussion  bilaterally Cardio: regular rate and rhythm GI: soft, non-tender; bowel sounds normal; no masses,  no organomegaly Extremities: extremities normal, atraumatic, no cyanosis or edema Neuro:nonfocal Breasts without dominant masses or other findings of concern Portacath/ without erythema or tenderness, flushed after visit today  Lab Results:   Basename 09/18/11 1116  WBC 8.8  HGB 10.3*  HCT 30.9*  PLT 230   ANC 5.4 BMET  Basename 09/18/11 1116  NA 139  K 4.5  CL 105  CO2 24  GLUCOSE 82  BUN 19  CREATININE 0.93  CALCIUM 9.5  remainder of CMET not remarkable  CA 125  72, this having been 67 in May Studies/Results:  CT AP 06-26-11 as above Medications: I have reviewed the patient's current medications.  Assessment/Plan:  1. IIIC ovarian carcinoma: history as above, continuing tamoxifen. Clinically no symptoms of progressive disease despite gradual increase in CA 125 marker. She will see Dr Nelly Rout in ~ Sept and I will see her back at least in early Dec, coordinating my visit with PAC flush 2.PAC in, to be flushed every 8 wks 3.previous cytopenias with chemotherapy, transfusions of concern to family members who are [employer] 4.history of anxiety/ depression followed by psychiatry, HTN, IBS, borderline DM  Patient was comfortable with discussion and plan Oleg Oleson P, MD   09/19/2011, 1:24 PM

## 2011-09-21 ENCOUNTER — Telehealth: Payer: Self-pay

## 2011-09-21 NOTE — Telephone Encounter (Signed)
Spoke with Jaclyn [name] and told her that the ca-125 level is stable per dr. Darrold Span.  PT. Verbalized understanding.

## 2011-10-27 ENCOUNTER — Other Ambulatory Visit (HOSPITAL_COMMUNITY): Payer: Self-pay | Admitting: Family Medicine

## 2011-10-27 DIAGNOSIS — Z1231 Encounter for screening mammogram for malignant neoplasm of breast: Secondary | ICD-10-CM

## 2011-11-13 ENCOUNTER — Ambulatory Visit (HOSPITAL_BASED_OUTPATIENT_CLINIC_OR_DEPARTMENT_OTHER): Payer: Medicare Other

## 2011-11-13 ENCOUNTER — Other Ambulatory Visit (HOSPITAL_BASED_OUTPATIENT_CLINIC_OR_DEPARTMENT_OTHER): Payer: Medicare Other

## 2011-11-13 VITALS — BP 137/70 | HR 69 | Temp 97.1°F

## 2011-11-13 DIAGNOSIS — C569 Malignant neoplasm of unspecified ovary: Secondary | ICD-10-CM

## 2011-11-13 DIAGNOSIS — Z452 Encounter for adjustment and management of vascular access device: Secondary | ICD-10-CM

## 2011-11-13 LAB — COMPREHENSIVE METABOLIC PANEL
BUN: 22 mg/dL (ref 6–23)
CO2: 23 mEq/L (ref 19–32)
Calcium: 9.4 mg/dL (ref 8.4–10.5)
Chloride: 106 mEq/L (ref 96–112)
Creatinine, Ser: 0.88 mg/dL (ref 0.50–1.10)
Glucose, Bld: 88 mg/dL (ref 70–99)
Total Bilirubin: 0.3 mg/dL (ref 0.3–1.2)

## 2011-11-13 LAB — CBC WITH DIFFERENTIAL/PLATELET
BASO%: 0.4 % (ref 0.0–2.0)
Basophils Absolute: 0 10*3/uL (ref 0.0–0.1)
HCT: 30.3 % — ABNORMAL LOW (ref 34.8–46.6)
HGB: 10.1 g/dL — ABNORMAL LOW (ref 11.6–15.9)
LYMPH%: 34.4 % (ref 14.0–49.7)
MCHC: 33.2 g/dL (ref 31.5–36.0)
MONO#: 0.5 10*3/uL (ref 0.1–0.9)
NEUT%: 56.1 % (ref 38.4–76.8)
Platelets: 240 10*3/uL (ref 145–400)
WBC: 8.4 10*3/uL (ref 3.9–10.3)

## 2011-11-13 LAB — CA 125: CA 125: 71.8 U/mL — ABNORMAL HIGH (ref 0.0–30.2)

## 2011-11-13 MED ORDER — SODIUM CHLORIDE 0.9 % IJ SOLN
10.0000 mL | INTRAMUSCULAR | Status: DC | PRN
  Administered 2011-11-13: 10 mL via INTRAVENOUS
  Filled 2011-11-13: qty 10

## 2011-11-13 MED ORDER — HEPARIN SOD (PORK) LOCK FLUSH 100 UNIT/ML IV SOLN
500.0000 [IU] | Freq: Once | INTRAVENOUS | Status: AC
  Administered 2011-11-13: 500 [IU] via INTRAVENOUS
  Filled 2011-11-13: qty 5

## 2011-11-13 NOTE — Patient Instructions (Signed)
Call MD for any problems 

## 2011-11-18 ENCOUNTER — Ambulatory Visit (HOSPITAL_COMMUNITY)
Admission: RE | Admit: 2011-11-18 | Discharge: 2011-11-18 | Disposition: A | Discharge status: Home / Self Care | Payer: Medicare Other | Source: Ambulatory Visit | Attending: Family Medicine | Admitting: Family Medicine

## 2011-11-18 DIAGNOSIS — Z1231 Encounter for screening mammogram for malignant neoplasm of breast: Secondary | ICD-10-CM | POA: Insufficient documentation

## 2011-11-25 ENCOUNTER — Telehealth: Payer: Self-pay

## 2011-11-25 NOTE — Telephone Encounter (Signed)
Spoke with Jaclyn [name] and told her that her ca-125 marker was stable at 71.8.  This is good per Dr. Darrold Span.  Keep appt. With Dr. Nelly Rout on 12-10-11 as scheduled.

## 2011-12-10 ENCOUNTER — Ambulatory Visit: Payer: Medicare Other | Attending: Gynecologic Oncology | Admitting: Gynecologic Oncology

## 2011-12-10 ENCOUNTER — Encounter: Payer: Self-pay | Admitting: Gynecologic Oncology

## 2011-12-10 VITALS — BP 100/62 | HR 68 | Temp 97.7°F | Resp 20 | Ht 63.98 in | Wt 186.0 lb

## 2011-12-10 DIAGNOSIS — R7309 Other abnormal glucose: Secondary | ICD-10-CM | POA: Insufficient documentation

## 2011-12-10 DIAGNOSIS — I10 Essential (primary) hypertension: Secondary | ICD-10-CM | POA: Insufficient documentation

## 2011-12-10 DIAGNOSIS — C569 Malignant neoplasm of unspecified ovary: Secondary | ICD-10-CM | POA: Insufficient documentation

## 2011-12-10 DIAGNOSIS — Z90710 Acquired absence of both cervix and uterus: Secondary | ICD-10-CM | POA: Insufficient documentation

## 2011-12-10 NOTE — Progress Notes (Signed)
REASON FOR VISIT: Surveillance for stage 3C ovarian cancer.   HISTORY OF PRESENT ILLNESS: This is a 71 year old with remote history of a vaginal hysterectomy and right salpingo-oophorectomy completed in 1988, pelvic mass was noted in January 2012. 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 She received 6 cycles of Taxol and carboplatin therapy with persistent elevations in her CA-125. Treatment was complicated by anemia and requirement for multiple blood transfusions. Her CA-125 in September 2011, remained minimally elevated to 40. On May 16, 2010  a trial with tamoxifen was undertaken given her poor tolerance of chemotherapy. Her CA-125 nadir was 20, the trend in Ca125  Has been progressively increasing to a value of 71.8.  PT  CT abd/pelvis  06/2101 without any evidence of disease.   Past Medical History  Diagnosis Date  . Anxiety   . Depression   . Diabetes mellitus     BORDERLINE  . Hypertension   . History of irritable bowel syndrome   . Hx of colonoscopy 0/2010    BY DR. MANN  . Ovarian cancer 04-02-2009    [facility]    Past Surgical History  Procedure Date  . Vaginal hysterectomy 1980  . Tubial ligation   . Tubal ligation 1963    BILATERAL  . Cholecystectomy     DONE ~ 2007   .  REVIEW OF SYSTEMS: CONSTITUTIONAL: No nausea, vomiting, fever, chills, no change in vision, no icterus, no headache, shortness of breath, or adenopathy. No abdominal bloating, no anorexia and change in appetite, no weight loss. No bloating. GI: No  diarrhea, constipation, hematuria, hematochezia, and hemat occult.  No swelling of the lower extremities. Otherwise, 10 system review is negative.    PHYSICAL EXAMINATION: GENERAL: Well-developed female in no acute  distress.  VITALS: BP 100/62  Pulse 68  Temp 97.7 F (36.5 C) (Oral)  Resp 20  Ht 5' 3.98"  (1.625 m)  Wt 186 lb (84.369 kg)  BMI 31.95 kg/m2 CHEST: Clear to auscultation.  HEART: Regular rate and rhythm.  ABDOMEN: Soft, nontender, and obese. No palpable masses, no ascitic fluid wave PELVIC EXAMINATION: Normal external genitalia; Bartholin's, urethral,  and Skene's. No masses in the vagina, cul-de-sac, or pelvis.  RECTAL:  Fair tone, no masses EXTREMITIES: No clubbing, cyanosis, or edema.   ASSESSMENT/PLAN : Ms. Einspahr is a 71 y.o.-year-old with stage 3C endometroid ovarian cancer, currently on tamoxifen with slow increase in her CA-125 to the low 70's without evidence of disease on imaging or symptoms.  The patient wishes to continue supportive care. She is very happy with the quality of life and has no complaints.  This patient likely has very low volume disease is not detectable on imaging and is asymptomatic. Ms. Person is very poorly tolerant of chemotherapy.  Will discuss limited chemotherapy options if and when she becomes symptomatic or disease is noted on physical examination  Ms. Benegas will see Dr Darrold Span as scheduled in December 2013 and return to GYN Oncology in March 2014. Continue tamoxifen

## 2011-12-10 NOTE — Patient Instructions (Addendum)
Visit with Dr Darrold Span as scheduled in December 2013 and return to GYN Oncology in March 2014.  Thank you very much Ms. Jaclyn [name] for allowing me to provide care for you today.  I appreciate your confidence in choosing our Gynecologic Oncology team.  If you have any questions about your visit today please call our office and we will get back to you as soon as possible.  Jaclyn [name]. Daymon Hora MD., PhD Gynecologic Oncology

## 2012-01-08 ENCOUNTER — Ambulatory Visit (HOSPITAL_BASED_OUTPATIENT_CLINIC_OR_DEPARTMENT_OTHER): Payer: Medicare Other

## 2012-01-08 ENCOUNTER — Other Ambulatory Visit (HOSPITAL_BASED_OUTPATIENT_CLINIC_OR_DEPARTMENT_OTHER): Payer: Medicare Other | Admitting: Lab

## 2012-01-08 VITALS — BP 144/72 | HR 72 | Temp 97.3°F

## 2012-01-08 DIAGNOSIS — C569 Malignant neoplasm of unspecified ovary: Secondary | ICD-10-CM

## 2012-01-08 LAB — COMPREHENSIVE METABOLIC PANEL (CC13)
Albumin: 3.4 g/dL — ABNORMAL LOW (ref 3.5–5.0)
Alkaline Phosphatase: 50 U/L (ref 40–150)
BUN: 20 mg/dL (ref 7.0–26.0)
Glucose: 85 mg/dl (ref 70–99)
Potassium: 4.3 mEq/L (ref 3.5–5.1)
Total Bilirubin: 0.3 mg/dL (ref 0.20–1.20)

## 2012-01-08 LAB — CBC WITH DIFFERENTIAL/PLATELET
Basophils Absolute: 0 10*3/uL (ref 0.0–0.1)
EOS%: 4.1 % (ref 0.0–7.0)
HCT: 30.4 % — ABNORMAL LOW (ref 34.8–46.6)
HGB: 10.1 g/dL — ABNORMAL LOW (ref 11.6–15.9)
MCH: 28 pg (ref 25.1–34.0)
MCV: 84.2 fL (ref 79.5–101.0)
MONO%: 4.5 % (ref 0.0–14.0)
NEUT%: 56.1 % (ref 38.4–76.8)
Platelets: 267 10*3/uL (ref 145–400)

## 2012-01-08 MED ORDER — HEPARIN SOD (PORK) LOCK FLUSH 100 UNIT/ML IV SOLN
500.0000 [IU] | Freq: Once | INTRAVENOUS | Status: AC
  Administered 2012-01-08: 500 [IU] via INTRAVENOUS
  Filled 2012-01-08: qty 5

## 2012-01-08 MED ORDER — SODIUM CHLORIDE 0.9 % IJ SOLN
10.0000 mL | INTRAMUSCULAR | Status: DC | PRN
  Administered 2012-01-08: 10 mL via INTRAVENOUS
  Filled 2012-01-08: qty 10

## 2012-02-03 ENCOUNTER — Inpatient Hospital Stay (HOSPITAL_COMMUNITY)
Admission: EM | Admit: 2012-02-03 | Discharge: 2012-02-11 | DRG: 389 | Disposition: A | Discharge status: Home / Self Care | Payer: Medicare Other | Attending: Internal Medicine | Admitting: Internal Medicine

## 2012-02-03 ENCOUNTER — Emergency Department (HOSPITAL_COMMUNITY): Payer: Medicare Other

## 2012-02-03 ENCOUNTER — Encounter (HOSPITAL_COMMUNITY): Payer: Self-pay | Admitting: *Deleted

## 2012-02-03 DIAGNOSIS — C800 Disseminated malignant neoplasm, unspecified: Secondary | ICD-10-CM

## 2012-02-03 DIAGNOSIS — F329 Major depressive disorder, single episode, unspecified: Secondary | ICD-10-CM | POA: Diagnosis present

## 2012-02-03 DIAGNOSIS — T451X5A Adverse effect of antineoplastic and immunosuppressive drugs, initial encounter: Secondary | ICD-10-CM | POA: Diagnosis present

## 2012-02-03 DIAGNOSIS — C801 Malignant (primary) neoplasm, unspecified: Secondary | ICD-10-CM

## 2012-02-03 DIAGNOSIS — F3289 Other specified depressive episodes: Secondary | ICD-10-CM | POA: Diagnosis present

## 2012-02-03 DIAGNOSIS — R509 Fever, unspecified: Secondary | ICD-10-CM | POA: Diagnosis present

## 2012-02-03 DIAGNOSIS — C569 Malignant neoplasm of unspecified ovary: Secondary | ICD-10-CM | POA: Diagnosis present

## 2012-02-03 DIAGNOSIS — C786 Secondary malignant neoplasm of retroperitoneum and peritoneum: Secondary | ICD-10-CM | POA: Diagnosis present

## 2012-02-03 DIAGNOSIS — D6481 Anemia due to antineoplastic chemotherapy: Secondary | ICD-10-CM | POA: Diagnosis present

## 2012-02-03 DIAGNOSIS — K56609 Unspecified intestinal obstruction, unspecified as to partial versus complete obstruction: Principal | ICD-10-CM | POA: Diagnosis present

## 2012-02-03 DIAGNOSIS — E876 Hypokalemia: Secondary | ICD-10-CM | POA: Diagnosis present

## 2012-02-03 DIAGNOSIS — D638 Anemia in other chronic diseases classified elsewhere: Secondary | ICD-10-CM | POA: Diagnosis present

## 2012-02-03 DIAGNOSIS — D630 Anemia in neoplastic disease: Secondary | ICD-10-CM | POA: Diagnosis present

## 2012-02-03 DIAGNOSIS — E785 Hyperlipidemia, unspecified: Secondary | ICD-10-CM | POA: Diagnosis present

## 2012-02-03 DIAGNOSIS — K56600 Partial intestinal obstruction, unspecified as to cause: Secondary | ICD-10-CM

## 2012-02-03 HISTORY — DX: Gastro-esophageal reflux disease without esophagitis: K21.9

## 2012-02-03 LAB — CBC WITH DIFFERENTIAL/PLATELET
Eosinophils Absolute: 0.1 10*3/uL (ref 0.0–0.7)
Hemoglobin: 11.1 g/dL — ABNORMAL LOW (ref 12.0–15.0)
Lymphocytes Relative: 11 % — ABNORMAL LOW (ref 12–46)
Lymphs Abs: 1.4 10*3/uL (ref 0.7–4.0)
MCH: 28 pg (ref 26.0–34.0)
Monocytes Relative: 3 % (ref 3–12)
Neutrophils Relative %: 86 % — ABNORMAL HIGH (ref 43–77)
RBC: 3.97 MIL/uL (ref 3.87–5.11)

## 2012-02-03 LAB — POCT I-STAT, CHEM 8
BUN: 28 mg/dL — ABNORMAL HIGH (ref 6–23)
Chloride: 105 mEq/L (ref 96–112)
Creatinine, Ser: 1.3 mg/dL — ABNORMAL HIGH (ref 0.50–1.10)
Glucose, Bld: 122 mg/dL — ABNORMAL HIGH (ref 70–99)
Hemoglobin: 11.6 g/dL — ABNORMAL LOW (ref 12.0–15.0)
Potassium: 3.5 mEq/L (ref 3.5–5.1)

## 2012-02-03 MED ORDER — SODIUM CHLORIDE 0.9 % IV SOLN
Freq: Once | INTRAVENOUS | Status: AC
  Administered 2012-02-03: 20 mL/h via INTRAVENOUS

## 2012-02-03 MED ORDER — IOHEXOL 300 MG/ML  SOLN
80.0000 mL | Freq: Once | INTRAMUSCULAR | Status: AC | PRN
  Administered 2012-02-03: 80 mL via INTRAVENOUS

## 2012-02-03 MED ORDER — ONDANSETRON 8 MG PO TBDP
8.0000 mg | ORAL_TABLET | Freq: Once | ORAL | Status: AC
  Administered 2012-02-03: 8 mg via ORAL

## 2012-02-03 MED ORDER — ONDANSETRON 8 MG PO TBDP
ORAL_TABLET | ORAL | Status: AC
  Filled 2012-02-03: qty 1

## 2012-02-03 MED ORDER — SODIUM CHLORIDE 0.9 % IV BOLUS (SEPSIS)
500.0000 mL | Freq: Once | INTRAVENOUS | Status: AC
  Administered 2012-02-03: 500 mL via INTRAVENOUS

## 2012-02-03 MED ORDER — IOHEXOL 300 MG/ML  SOLN: 80.0000 mL | Freq: Once | INTRAMUSCULAR | Status: AC | PRN

## 2012-02-03 MED ORDER — HYDROMORPHONE HCL PF 1 MG/ML IJ SOLN
0.5000 mg | Freq: Once | INTRAMUSCULAR | Status: AC
  Administered 2012-02-03: 0.5 mg via INTRAVENOUS
  Filled 2012-02-03: qty 1

## 2012-02-03 MED ORDER — ONDANSETRON HCL 4 MG/2ML IJ SOLN
4.0000 mg | Freq: Once | INTRAMUSCULAR | Status: AC
  Administered 2012-02-03: 4 mg via INTRAVENOUS
  Filled 2012-02-03: qty 2

## 2012-02-03 NOTE — ED Provider Notes (Signed)
History     CSN: [id number]  Arrival date & time 02/03/12  1478   First MD Initiated Contact with Patient 02/03/12 2030      Chief Complaint  Patient presents with  . Nausea  . Emesis  . Diarrhea    (Consider location/radiation/quality/duration/timing/severity/associated sxs/prior treatment) HPI Comments: Pateint reports had diarrhea 2 days ago took Imodium with total resolution yesterday, ate and drank normally until this afternoon when she developed N/V/D and diffuse abdominal cramping. Has Hx Ovarian Ca for the past 3 years and is on maintainance  therapy of Methotrexate. She did not contact any of her doctors.   Patient is a 71 y.o. female presenting with vomiting and diarrhea. The history is provided by the patient.  Emesis  This is a new problem. The current episode started 2 days ago. The problem occurs more than 10 times per day. The problem has been gradually worsening. The emesis has an appearance of bilious material. Associated symptoms include abdominal pain and diarrhea. Pertinent negatives include no chills and no fever.  Diarrhea The primary symptoms include abdominal pain, nausea, vomiting and diarrhea. Primary symptoms do not include fever.  The illness does not include chills or back pain.    Past Medical History  Diagnosis Date  . Anxiety   . Depression   . Diabetes mellitus     BORDERLINE  . Hypertension   . History of irritable bowel syndrome   . Hx of colonoscopy 0/2010    BY DR. MANN  . Ovarian cancer 04-02-2009    [facility]  . Gastric reflux     Past Surgical History  Procedure Date  . Vaginal hysterectomy 1980  . Tubial ligation   . Tubal ligation 1963    BILATERAL  . Cholecystectomy     DONE ~ 2007    Family History  Problem Relation Age of Onset  . Breast cancer Cousin     History  Substance Use Topics  . Smoking status: Never Smoker   . Smokeless tobacco: Never Used  . Alcohol Use: No    OB History    Grav Para Term Preterm  Abortions TAB SAB Ect Mult Living                  Review of Systems  Constitutional: Negative for fever and chills.  HENT: Negative for rhinorrhea.   Eyes: Negative.   Respiratory: Negative for shortness of breath.   Cardiovascular: Negative for chest pain and leg swelling.  Gastrointestinal: Positive for nausea, vomiting, abdominal pain, diarrhea and abdominal distention. Negative for blood in stool.  Musculoskeletal: Negative for back pain and joint swelling.  Skin: Negative for color change.  Neurological: Negative for dizziness, weakness and numbness.  Hematological: Negative for adenopathy.    Allergies  Lorazepam and Sulfa antibiotics  Home Medications   Current Outpatient Rx  Name  Route  Sig  Dispense  Refill  . ASPIRIN 81 MG PO TABS   Oral   Take 81 mg by mouth daily.           Marland Kitchen FERROUS GLUCONATE IRON 246 (28 FE) MG PO TABS   Oral   Take 246 mg by mouth daily with breakfast.           . LIDOCAINE-PRILOCAINE 2.5-2.5 % EX KIT   Topical   Apply 1 application topically as needed. PRIOR TO PORTA-CATH ACCESS   1 each   1   . LOSARTAN POTASSIUM-HCTZ 100-12.5 MG PO TABS   Oral  Take 1 tablet by mouth daily.           Marland Kitchen OMEPRAZOLE 20 MG PO CPDR   Oral   Take 20 mg by mouth 2 (two) times daily.         Marland Kitchen POTASSIUM CHLORIDE CRYS ER 20 MEQ PO TBCR   Oral   Take 20 mEq by mouth 2 (two) times daily.          . SERTRALINE HCL 25 MG PO TABS   Oral   Take 25 mg by mouth daily.           Marland Kitchen SIMVASTATIN 40 MG PO TABS   Oral   Take 40 mg by mouth at bedtime.           . TAMOXIFEN CITRATE 10 MG PO TABS   Oral   Take 10 mg by mouth 2 (two) times daily.           . ARIPIPRAZOLE 15 MG PO TABS   Oral   Take 30 mg by mouth daily.            BP 121/61  Pulse 94  Temp 99.6 F (37.6 C) (Oral)  Resp 20  SpO2 98%  Physical Exam  Constitutional: She is oriented to person, place, and time. She appears well-developed and well-nourished.  HENT:    Head: Normocephalic.  Eyes: Pupils are equal, round, and reactive to light.  Neck: Normal range of motion.  Cardiovascular: Normal rate.   Pulmonary/Chest: Effort normal and breath sounds normal. No respiratory distress.  Abdominal: Soft. Bowel sounds are normal. She exhibits no distension. There is no tenderness.  Musculoskeletal: Normal range of motion. She exhibits no edema and no tenderness.  Neurological: She is alert and oriented to person, place, and time.  Skin: Skin is warm and dry. No rash noted.    ED Course  Procedures (including critical care time)   Labs Reviewed  CBC WITH DIFFERENTIAL   No results found.   No diagnosis found.    MDM  Patient does not appear in distress, lips are dry unsure if this is due to mild dehydration or the fact that her lip are in constant motion- sucking on them, smacking them etc X ray reviewed partial small bowel obstruction- will CT with PO contrast only due to increase in BUN/Creatitine New tumor seen on CT Scan will admit medically with surgical consult. Discussed with DR. Dierdre Highman for admission he will discuss with [facility]       Arman Filter, NP 02/04/12 [phone]

## 2012-02-03 NOTE — ED Notes (Signed)
IV rn notified regarding port access for pt.

## 2012-02-03 NOTE — ED Notes (Signed)
NP @bedside

## 2012-02-03 NOTE — ED Notes (Addendum)
Pt reports onset of diarrhea that began on Monday, today pt started to hav n/v as well. Pt admits to abd pain and bloating. Unsure of fever. Pt vomiting while in triage.

## 2012-02-03 NOTE — ED Notes (Signed)
[id number]<AV> Expected date:<BR> Expected time:<BR> Means of arrival:<BR> Comments:<BR> Hold for room 2

## 2012-02-03 NOTE — ED Notes (Signed)
Patient transported to X-ray 

## 2012-02-04 DIAGNOSIS — C786 Secondary malignant neoplasm of retroperitoneum and peritoneum: Secondary | ICD-10-CM | POA: Diagnosis present

## 2012-02-04 DIAGNOSIS — K56609 Unspecified intestinal obstruction, unspecified as to partial versus complete obstruction: Secondary | ICD-10-CM

## 2012-02-04 DIAGNOSIS — R112 Nausea with vomiting, unspecified: Secondary | ICD-10-CM

## 2012-02-04 DIAGNOSIS — K56600 Partial intestinal obstruction, unspecified as to cause: Secondary | ICD-10-CM | POA: Diagnosis present

## 2012-02-04 LAB — CBC
HCT: 29.2 % — ABNORMAL LOW (ref 36.0–46.0)
Hemoglobin: 10.1 g/dL — ABNORMAL LOW (ref 12.0–15.0)
MCH: 28.3 pg (ref 26.0–34.0)
MCHC: 34.6 g/dL (ref 30.0–36.0)
MCV: 81.8 fL (ref 78.0–100.0)
RBC: 3.57 MIL/uL — ABNORMAL LOW (ref 3.87–5.11)

## 2012-02-04 LAB — BASIC METABOLIC PANEL
BUN: 24 mg/dL — ABNORMAL HIGH (ref 6–23)
CO2: 19 mEq/L (ref 19–32)
Calcium: 9.5 mg/dL (ref 8.4–10.5)
GFR calc non Af Amer: 55 mL/min — ABNORMAL LOW (ref >90)
Glucose, Bld: 104 mg/dL — ABNORMAL HIGH (ref 70–99)
Sodium: 132 mEq/L — ABNORMAL LOW (ref 135–145)

## 2012-02-04 LAB — GLUCOSE, CAPILLARY

## 2012-02-04 MED ORDER — ASPIRIN EC 81 MG PO TBEC
81.0000 mg | DELAYED_RELEASE_TABLET | Freq: Every day | ORAL | Status: DC
  Administered 2012-02-04: 81 mg via ORAL
  Filled 2012-02-04 (×3): qty 1

## 2012-02-04 MED ORDER — SODIUM PHOSPHATE 3 MMOLE/ML IV SOLN
20.0000 mmol | Freq: Once | INTRAVENOUS | Status: AC
  Administered 2012-02-04: 20 mmol via INTRAVENOUS
  Filled 2012-02-04: qty 6.67

## 2012-02-04 MED ORDER — BIOTENE DRY MOUTH MT LIQD
15.0000 mL | Freq: Two times a day (BID) | OROMUCOSAL | Status: DC
  Administered 2012-02-04 – 2012-02-11 (×11): 15 mL via OROMUCOSAL

## 2012-02-04 MED ORDER — HYDROMORPHONE HCL PF 1 MG/ML IJ SOLN
0.5000 mg | INTRAMUSCULAR | Status: DC | PRN
  Administered 2012-02-04 – 2012-02-07 (×7): 0.5 mg via INTRAVENOUS
  Filled 2012-02-04 (×8): qty 1

## 2012-02-04 MED ORDER — SIMVASTATIN 40 MG PO TABS
40.0000 mg | ORAL_TABLET | Freq: Every day | ORAL | Status: DC
  Administered 2012-02-04: 40 mg via ORAL
  Filled 2012-02-04 (×3): qty 1

## 2012-02-04 MED ORDER — CHLORHEXIDINE GLUCONATE 0.12 % MT SOLN
15.0000 mL | Freq: Two times a day (BID) | OROMUCOSAL | Status: DC
  Administered 2012-02-04 – 2012-02-11 (×14): 15 mL via OROMUCOSAL
  Filled 2012-02-04 (×17): qty 15

## 2012-02-04 MED ORDER — POTASSIUM CHLORIDE 10 MEQ/100ML IV SOLN
10.0000 meq | INTRAVENOUS | Status: AC
  Administered 2012-02-04 (×6): 10 meq via INTRAVENOUS
  Filled 2012-02-04 (×6): qty 100

## 2012-02-04 MED ORDER — ONDANSETRON HCL 4 MG/2ML IJ SOLN
4.0000 mg | Freq: Four times a day (QID) | INTRAMUSCULAR | Status: DC | PRN
Start: 2012-02-04 — End: 2012-02-11
  Administered 2012-02-04 – 2012-02-05 (×3): 4 mg via INTRAVENOUS
  Filled 2012-02-04 (×3): qty 2

## 2012-02-04 MED ORDER — PHENOL 1.4 % MT LIQD
1.0000 | OROMUCOSAL | Status: DC | PRN
  Administered 2012-02-05: 1 via OROMUCOSAL
  Filled 2012-02-04: qty 177

## 2012-02-04 MED ORDER — FERROUS GLUCONATE 324 (38 FE) MG PO TABS
324.0000 mg | ORAL_TABLET | Freq: Every day | ORAL | Status: DC
  Administered 2012-02-04 – 2012-02-05 (×2): 324 mg via ORAL
  Filled 2012-02-04 (×4): qty 1

## 2012-02-04 MED ORDER — PANTOPRAZOLE SODIUM 40 MG PO TBEC
40.0000 mg | DELAYED_RELEASE_TABLET | Freq: Every day | ORAL | Status: DC
  Administered 2012-02-04: 40 mg via ORAL
  Filled 2012-02-04 (×2): qty 1

## 2012-02-04 MED ORDER — SODIUM CHLORIDE 0.9 % IJ SOLN: 3.0000 mL | Freq: Two times a day (BID) | INTRAMUSCULAR | Status: DC

## 2012-02-04 MED ORDER — POTASSIUM CHLORIDE CRYS ER 20 MEQ PO TBCR
40.0000 meq | EXTENDED_RELEASE_TABLET | Freq: Once | ORAL | Status: AC
  Administered 2012-02-04: 40 meq via ORAL
  Filled 2012-02-04: qty 2

## 2012-02-04 MED ORDER — SODIUM CHLORIDE 0.9 % IV SOLN
INTRAVENOUS | Status: DC
  Administered 2012-02-04 – 2012-02-08 (×7): via INTRAVENOUS

## 2012-02-04 MED ORDER — HEPARIN SODIUM (PORCINE) 5000 UNIT/ML IJ SOLN
5000.0000 [IU] | Freq: Three times a day (TID) | INTRAMUSCULAR | Status: DC
  Administered 2012-02-04 – 2012-02-11 (×22): 5000 [IU] via SUBCUTANEOUS
  Filled 2012-02-04 (×25): qty 1

## 2012-02-04 MED ORDER — SERTRALINE HCL 25 MG PO TABS
25.0000 mg | ORAL_TABLET | Freq: Every day | ORAL | Status: DC
  Administered 2012-02-04 – 2012-02-11 (×5): 25 mg via ORAL
  Filled 2012-02-04 (×8): qty 1

## 2012-02-04 MED ORDER — TAMOXIFEN CITRATE 10 MG PO TABS
10.0000 mg | ORAL_TABLET | Freq: Two times a day (BID) | ORAL | Status: DC
  Administered 2012-02-04 – 2012-02-08 (×4): 10 mg via ORAL
  Filled 2012-02-04 (×12): qty 1

## 2012-02-04 MED ORDER — LIDOCAINE-PRILOCAINE 2.5-2.5 % EX KIT
1.0000 "application " | PACK | CUTANEOUS | Status: DC | PRN
  Filled 2012-02-04: qty 1

## 2012-02-04 NOTE — Progress Notes (Signed)
I have seen and examined the patient and agree with the assessment and plans. Will continue conservative treatment.  Teressa Mcglocklin A. Chanay Nugent  MD, FACS 

## 2012-02-04 NOTE — ED Notes (Signed)
MD at bedside. 

## 2012-02-04 NOTE — Progress Notes (Signed)
Utilization review completed.  

## 2012-02-04 NOTE — H&P (Addendum)
[facility] History and Physical  Jaclyn [name] RUE:[id number] DOB: [birth year] DOA: 02/03/2012  Referring physician: ED PCP: Laurette Schimke, MD PHD  Specialists: Dr. Nelly Rout is patients GYN/ONC  Chief Complaint: N/V abd pain  HPI: Jaclyn [name] is a 70 y.o. female who presents to the ED with 1 day history of N/V and abdominal pain, 10 episodes of vomiting since 6pm last night.  Nothing makes it worse, zofran made it better.  Occuring in context of history of ovarian cancer, stage 3C had been subclinical disease as of September but had elevating CA-125 in September so there was concern it might be recurring.  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  Surgery has been consulted, medicine has been asked to admit.  Review of Systems: 12 systems reviewed and otherwise negative.  Past Medical History  Diagnosis Date  . Anxiety   . Depression   . Diabetes mellitus     BORDERLINE  . Hypertension   . History of irritable bowel syndrome   . Hx of colonoscopy 0/2010    BY DR. MANN  . Ovarian cancer 04-02-2009    [facility]  . Gastric reflux    Past Surgical History  Procedure Date  . Vaginal hysterectomy 1980  . Tubial ligation   . Tubal ligation 1963    BILATERAL  . Cholecystectomy     DONE ~ 2007   Social History:  reports that she has never smoked. She has never used smokeless tobacco. She reports that she does not drink alcohol or use illicit drugs.   Allergies  Allergen Reactions  . Lorazepam     " INTOLERANT-2 DAUGHTERS"  . Sulfa Antibiotics     Pt unable to remember reaction    Family History  Problem Relation Age of Onset  . Breast cancer Cousin      Prior to Admission medications   Medication Sig Start Date End Date Taking? Authorizing Provider  aspirin 81 MG tablet Take 81 mg by mouth daily.     Yes Historical Provider,  MD  ferrous gluconate (FERGON) 246 (28 FE) MG tablet Take 246 mg by mouth daily with breakfast.   05/24/09  Yes Lennis P Livesay, MD  lidocaine-prilocaine (EMLA) cream Apply 1 application topically as needed. PRIOR TO PORTA-CATH ACCESS 07/24/11  Yes Lennis Buzzy Han, MD  losartan-hydrochlorothiazide (HYZAAR) 100-12.5 MG per tablet Take 1 tablet by mouth daily.   08/23/09  Yes Renaye Rakers, MD  omeprazole (PRILOSEC) 20 MG capsule Take 20 mg by mouth 2 (two) times daily.   Yes Historical Provider, MD  potassium chloride SA (K-DUR,KLOR-CON) 20 MEQ tablet Take 20 mEq by mouth 2 (two) times daily.  10/23/10  Yes Historical Provider, MD  sertraline (ZOLOFT) 25 MG tablet Take 25 mg by mouth daily.   12/07/08  Yes Keshavpal Jamelle Haring, MD  simvastatin (ZOCOR) 40 MG tablet Take 40 mg by mouth at bedtime.   01/26/11  Yes Historical Provider, MD  tamoxifen (NOLVADEX) 10 MG tablet Take 10 mg by mouth 2 (two) times daily.   01/16/10  Yes Laurette Schimke, MD PHD  ARIPiprazole (ABILIFY) 15 MG tablet Take 30 mg by mouth daily.  01/26/11   Marcellina Millin, MD   Physical Exam: Filed Vitals:   02/03/12 2001 02/04/12 0007  BP: 121/61 122/55  Pulse: 94 83  Temp: 99.6 F (37.6 C) 98.2 F (36.8 C)  TempSrc: Oral Oral  Resp: 20 18  SpO2: 98% 100%     General:  NAD, resting comfortably in bed  Eyes: PEERLA EOMI  ENT: mucous membranes moist  Neck: supple w/o JVD  Cardiovascular: RRR w/o MRG  Respiratory: CTA B  Abdomen: soft, mild diffuse tenderness, mild ascities, bs+  Skin: no rash nor lesion  Musculoskeletal: MAE, full ROM all 4 extremities  Psychiatric: tone and affect do become somewhat depressed after I have to deliver her the bad news of what we are suspicious of on the most recent imaging study  Neurologic: AAOx3, grossly non-focal  Labs on Admission:  Basic Metabolic Panel:  Lab 02/03/12 4540  NA 136  K 3.5  CL 105  CO2 --  GLUCOSE 122*  BUN 28*  CREATININE 1.30*  CALCIUM --  MG  --  PHOS --   Liver Function Tests: No results found for this basename: AST:5,ALT:5,ALKPHOS:5,BILITOT:5,PROT:5,ALBUMIN:5 in the last 168 hours No results found for this basename: LIPASE:5,AMYLASE:5 in the last 168 hours No results found for this basename: AMMONIA:5 in the last 168 hours CBC:  Lab 02/03/12 2147 02/03/12 2130  WBC -- 12.8*  NEUTROABS -- 11.0*  HGB 11.6* 11.1*  HCT 34.0* 32.4*  MCV -- 81.6  PLT -- 266   Cardiac Enzymes: No results found for this basename: CKTOTAL:5,CKMB:5,CKMBINDEX:5,TROPONINI:5 in the last 168 hours  BNP (last 3 results) No results found for this basename: PROBNP:3 in the last 8760 hours CBG: No results found for this basename: GLUCAP:5 in the last 168 hours  Radiological Exams on Admission: Ct Abdomen Pelvis W Contrast  02/04/2012  *RADIOLOGY REPORT*  Clinical Data: History of ovarian cancer.  Evaluate for small bowel obstruction  CT ABDOMEN AND PELVIS WITH CONTRAST  Technique:  Multidetector CT imaging of the abdomen and pelvis was performed following the standard protocol during bolus administration of intravenous contrast.  Contrast: 80mL OMNIPAQUE IOHEXOL 300 MG/ML  SOLN  Comparison: 06/26/2011  Findings:  Lung bases:  No pericardial or pleural effusion.  Lung bases appear clear.  Abdomen/pelvis:  There is diffuse fatty infiltration of the liver.  No focal liver parenchymal abnormality identified.  Prior cholecystectomy.  The common bile duct is increased in caliber measuring 10 mm.  The pancreas appears within normal limits.  Normal appearance of the spleen.  Normal appearance of the adrenal glands.  Kidneys are unremarkable.  The urinary bladder is within normal limits.  Previous hysterectomy.  No retroperitoneal or mesenteric adenopathy identified.  There is no pelvic or inguinal adenopathy.  The stomach is mildly distended.  Proximal small bowel loops are increased in caliber measuring up to 3.4 cm, image 38.  Transition to decreased caliber small  bowel loops are identified at the level of the mid ileum within the right lower quadrant, image 65.  The colon appears within normal limits.  A small amount of perihepatic ascites is noted.  There is also a small to moderate amount of ascites within the pelvis.  Evidence of peritoneal nodularity is noted.  For example, left upper quadrant of the abdomen peritoneal nodules seen on image 36 measuring 1.9 cm, image 36.  This is new from previous exam.  Scattered calcified peritoneal nodules are also identified which likely represent areas of treated tumor.  Bones/Musculoskeletal:  Review of the visualized osseous structures is unremarkable.  No aggressive lytic or sclerotic bone lesions  IMPRESSION:  1.  Examination is positive for partial small bowel obstruction with right lower quadrant transition point. 2.  Ascites  and peritoneal nodule consistent with peritoneal carcinomatosis.  When compared with previous exam there is a new nodule within the left upper quadrant of the abdomen. Additionally, the volume of ascites has increased in the interval. In the setting of bowel obstruction the ascites is somewhat nonspecific.  3.  Fatty infiltration of the liver.   Original Report Authenticated By: Signa Kell, M.D.    Dg Abd Acute W/chest  02/03/2012  *RADIOLOGY REPORT*  Clinical Data: Vomiting, ovarian cancer.  Question obstruction.  ACUTE ABDOMEN SERIES (ABDOMEN 2 VIEW & CHEST 1 VIEW)  Comparison: CT 06/26/2011  Findings: Right Port-A-Cath in place with the tip in the SVC. Heart and mediastinal contours are within normal limits.  No focal opacities or effusions.  No acute bony abnormality.  There are prominent small bowel loops in the mid and lower abdomen. Scattered air-fluid levels.  Gas is seen within the colon. Findings are concerning for partial small bowel obstruction.  Prior cholecystectomy.  No organomegaly or free air.  IMPRESSION: Findings concerning for partial small bowel obstruction.   Original Report  Authenticated By: Charlett Nose, M.D.     EKG: Independently reviewed.  Assessment/Plan Principal Problem:  *Partial small bowel obstruction Active Problems:  Ovarian cancer  Peritoneal carcinomatosis   1. PSBO - keeping patient NPO except meds and ice chips, giving fluids IV, will defer decision regarding NGT in patient who is now not vomiting with PSBO to surgery (as long as vomiting is controlled with zofran, if becomes uncontrolled then need to place NGT). 2. Ovarian cancer with new evidence of peritoneal carcinomatosis - Ascities and findings of new nodules on CT scan, have put in courtesy consult for GYN/ONC so patient shows up in computer system, formally consulted heme/onc Dr. Darrold Span Patient is somewhat depressed on hearing what we suspect from the CT scan though upon reading GYN/ONCs September note it sounds like this recurrence is not un-expected given the elevation of her CA-125 on recent lab work.  Called and spoke with Dr. Darrold Span from heme/onc for formal consultation given this new cancer related finding.  Gen surg has been consulted by the ED. Put in courtesy consult for GYN/ONC into computer system so she shows up on their list. Put in full consult for Heme/onc (called and spoke with Dr. Darrold Span).  Code Status: Full Code (must indicate code status--if unknown or must be presumed, indicate so) Family Communication: Spoke with patient and daughter (indicate person spoken with, if applicable, with phone number if by telephone) Disposition Plan: Admit to inpatient (I suspect SBO may take several days to resolve) (indicate anticipated LOS)  Time spent: 70 min  Jahmire Ruffins M. [facility] Pager [phone]  If 7PM-7AM, please contact night-coverage [email or web address] Password [facility] 02/04/2012, 2:39 AM

## 2012-02-04 NOTE — ED Provider Notes (Signed)
Medical screening examination/treatment/procedure(s) were conducted as a shared visit with non-physician practitioner(s) and myself.  I personally evaluated the patient during the encounter. ABd pain N/V and h/o ov cancer with resection of mass 2012. On exam has mild diffuse tenderness and dec bowel sounds. CT shows partial SBO with carcinomatosis. I shared results with Pt. At 1:10am d/w Dr Hassell Halim, will follow, plan MED admit. NGT ordered.   Sunnie Nielsen, MD 02/04/12 [phone]

## 2012-02-04 NOTE — Progress Notes (Signed)
Medical Oncology  Hospital day 1  Appreciate notification of admission by hospitalist, with SBO. Patient seen and examined, EMR and CT images reviewed. Agree with current conservative treatment with bowel rest. Nothing emergent needed otherwise from standpoint of the gyn cancer, which may be gradually more progressive, but not large volume disease now.   Full note to follow. See my office note of 09-19-11 and Dr Forrestine Him gyn oncology note of 12-10-11 for additional history and details now. I will see again tomorrow. Please call prior if needed. L.Avishai Reihl, MD [phone]

## 2012-02-04 NOTE — Consult Note (Signed)
Reason for Consult: partial small bowel obstruction Referring Physician: Dr. Antionette Fairy is an 71 y.o. female.  HPI: the patient was admitted after a one-day history of nausea vomiting and abdominal pain. Patient states she began having diarrhea on the Monday of this week. This was subsequently followed by her nausea and vomiting. She states she had been taking Imodium for diarrhea earlier this week.  The patient states she's never had her been hospitalized for a bowel obstruction the past. The patient states she's had her last bowel movement yesterday morning, at her last episode of nausea vomiting in the ED. 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 Patient currently on tamoxifen.  Past Medical History  Diagnosis Date  . Anxiety   . Depression   . Diabetes mellitus     BORDERLINE  . Hypertension   . History of irritable bowel syndrome   . Hx of colonoscopy 0/2010    BY DR. MANN  . Ovarian cancer 04-02-2009    [facility]  . Gastric reflux     Past Surgical History  Procedure Date  . Vaginal hysterectomy 1980  . Tubial ligation   . Tubal ligation 1963    BILATERAL  . Cholecystectomy     DONE ~ 2007    Family History  Problem Relation Age of Onset  . Breast cancer Cousin     Social History:  reports that she has never smoked. She has never used smokeless tobacco. She reports that she does not drink alcohol or use illicit drugs.  Allergies:  Allergies  Allergen Reactions  . Lorazepam     " INTOLERANT-2 DAUGHTERS"  . Sulfa Antibiotics     Pt unable to remember reaction    Medications: I have reviewed the patient's current medications.  Results for orders placed during the hospital encounter of 02/03/12 (from the past 48 hour(s))  CBC WITH DIFFERENTIAL     Status: Abnormal   Collection Time   02/03/12  9:30 PM      Component Value Range Comment   WBC 12.8 (*) 4.0 - 10.5 K/uL    RBC 3.97  3.87 - 5.11 MIL/uL    Hemoglobin 11.1 (*) 12.0 - 15.0 g/dL    HCT 16.1 (*) 09.6 - 46.0 %    MCV 81.6  78.0 - 100.0 fL    MCH 28.0  26.0 - 34.0 pg    MCHC 34.3  30.0 - 36.0 g/dL    RDW 04.5  40.9 - 81.1 %    Platelets 266  150 - 400 K/uL    Neutrophils Relative 86 (*) 43 - 77 %    Neutro Abs 11.0 (*) 1.7 - 7.7 K/uL    Lymphocytes Relative 11 (*) 12 - 46 %    Lymphs Abs 1.4  0.7 - 4.0 K/uL    Monocytes Relative 3  3 - 12 %    Monocytes Absolute 0.4  0.1 - 1.0 K/uL    Eosinophils Relative 1  0 - 5 %    Eosinophils Absolute 0.1  0.0 - 0.7 K/uL    Basophils Relative 0  0 - 1 %    Basophils Absolute 0.0  0.0 - 0.1 K/uL   POCT I-STAT, CHEM 8     Status: Abnormal   Collection Time   02/03/12  9:47 PM      Component Value Range Comment   Sodium 136  135 - 145 mEq/L    Potassium  3.5  3.5 - 5.1 mEq/L    Chloride 105  96 - 112 mEq/L    BUN 28 (*) 6 - 23 mg/dL    Creatinine, Ser 1.61 (*) 0.50 - 1.10 mg/dL    Glucose, Bld 096 (*) 70 - 99 mg/dL    Calcium, Ion 0.45 (*) 1.13 - 1.30 mmol/L    TCO2 18  0 - 100 mmol/L    Hemoglobin 11.6 (*) 12.0 - 15.0 g/dL    HCT 40.9 (*) 81.1 - 46.0 %   CBC     Status: Abnormal   Collection Time   02/04/12  5:00 AM      Component Value Range Comment   WBC 9.5  4.0 - 10.5 K/uL    RBC 3.57 (*) 3.87 - 5.11 MIL/uL    Hemoglobin 10.1 (*) 12.0 - 15.0 g/dL    HCT 91.4 (*) 78.2 - 46.0 %    MCV 81.8  78.0 - 100.0 fL    MCH 28.3  26.0 - 34.0 pg    MCHC 34.6  30.0 - 36.0 g/dL    RDW 95.6  21.3 - 08.6 %    Platelets 256  150 - 400 K/uL     Ct Abdomen Pelvis W Contrast  02/04/2012  *RADIOLOGY REPORT*  Clinical Data: History of ovarian cancer.  Evaluate for small bowel obstruction  CT ABDOMEN AND PELVIS WITH CONTRAST  Technique:  Multidetector CT imaging of the abdomen and pelvis was performed following the standard protocol during bolus administration of intravenous contrast.  Contrast: 80mL OMNIPAQUE IOHEXOL 300 MG/ML  SOLN  Comparison: 06/26/2011  Findings:  Lung bases:  No  pericardial or pleural effusion.  Lung bases appear clear.  Abdomen/pelvis:  There is diffuse fatty infiltration of the liver.  No focal liver parenchymal abnormality identified.  Prior cholecystectomy.  The common bile duct is increased in caliber measuring 10 mm.  The pancreas appears within normal limits.  Normal appearance of the spleen.  Normal appearance of the adrenal glands.  Kidneys are unremarkable.  The urinary bladder is within normal limits.  Previous hysterectomy.  No retroperitoneal or mesenteric adenopathy identified.  There is no pelvic or inguinal adenopathy.  The stomach is mildly distended.  Proximal small bowel loops are increased in caliber measuring up to 3.4 cm, image 38.  Transition to decreased caliber small bowel loops are identified at the level of the mid ileum within the right lower quadrant, image 65.  The colon appears within normal limits.  A small amount of perihepatic ascites is noted.  There is also a small to moderate amount of ascites within the pelvis.  Evidence of peritoneal nodularity is noted.  For example, left upper quadrant of the abdomen peritoneal nodules seen on image 36 measuring 1.9 cm, image 36.  This is new from previous exam.  Scattered calcified peritoneal nodules are also identified which likely represent areas of treated tumor.  Bones/Musculoskeletal:  Review of the visualized osseous structures is unremarkable.  No aggressive lytic or sclerotic bone lesions  IMPRESSION:  1.  Examination is positive for partial small bowel obstruction with right lower quadrant transition point. 2.  Ascites and peritoneal nodule consistent with peritoneal carcinomatosis.  When compared with previous exam there is a new nodule within the left upper quadrant of the abdomen. Additionally, the volume of ascites has increased in the interval. In the setting of bowel obstruction the ascites is somewhat nonspecific.  3.  Fatty infiltration of the liver.   Original Report  Authenticated  By: Signa Kell, M.D.    Dg Abd Acute W/chest  02/03/2012  *RADIOLOGY REPORT*  Clinical Data: Vomiting, ovarian cancer.  Question obstruction.  ACUTE ABDOMEN SERIES (ABDOMEN 2 VIEW & CHEST 1 VIEW)  Comparison: CT 06/26/2011  Findings: Right Port-A-Cath in place with the tip in the SVC. Heart and mediastinal contours are within normal limits.  No focal opacities or effusions.  No acute bony abnormality.  There are prominent small bowel loops in the mid and lower abdomen. Scattered air-fluid levels.  Gas is seen within the colon. Findings are concerning for partial small bowel obstruction.  Prior cholecystectomy.  No organomegaly or free air.  IMPRESSION: Findings concerning for partial small bowel obstruction.   Original Report Authenticated By: Charlett Nose, M.D.     Review of Systems  Constitutional: Negative.   HENT: Negative.   Eyes: Negative.   Respiratory: Negative.   Cardiovascular: Negative.   Gastrointestinal: Positive for nausea, vomiting, abdominal pain and diarrhea. Negative for blood in stool.  Genitourinary: Negative.   Musculoskeletal: Negative.   Neurological: Negative.    Blood pressure 146/71, pulse 92, temperature 98.8 F (37.1 C), temperature source Oral, resp. rate 18, height 5\' 4"  (1.626 m), weight 182 lb 8.7 oz (82.8 kg), SpO2 95.00%. Physical Exam  Constitutional: She is oriented to person, place, and time. She appears well-developed and well-nourished.  HENT:  Head: Normocephalic and atraumatic.  Eyes: Conjunctivae normal and EOM are normal. Pupils are equal, round, and reactive to light.  Neck: Normal range of motion.  Cardiovascular: Normal rate, regular rhythm and normal heart sounds.   Respiratory: Effort normal and breath sounds normal.  GI: Soft. Bowel sounds are normal. She exhibits distension. She exhibits no mass. There is no tenderness. There is no rebound and no guarding.  Musculoskeletal: Normal range of motion.  Neurological: She is alert and  oriented to person, place, and time.    Assessment/Plan: The patient is a 71 year old female with a partial small bowel obstruction ovarian cancer. Patient does have previous surgical history as above. At this time recommend place an NG tube for bowel decompression.  At this point we'll try to avoid any type of surgical intervention to treat the patient nonoperatively. If patient fails to resolve in to 3 days patient may need operative intervention.  Marigene Ehlers., Chawn Spraggins 02/04/2012, 6:23 AM

## 2012-02-04 NOTE — Progress Notes (Signed)
Informed patient would like to have CPR attempted and meds given if she does arrest but not be kept alive on life support.  Changing patients code status to full code at this time.

## 2012-02-04 NOTE — Progress Notes (Signed)
[facility] PROGRESS NOTE  Jaclyn [name] ZOX:[id number] DOB: [birth year] DOA: 02/03/2012 PCP: Laurette Schimke, MD PHD  Brief narrative: 71 year old female with history of ovarian cancer and peritoneal carcinomatosis who presented with intractable nausea and vomiting associated with abdominal pain. Patient was found to have partial SBO.  Assessment/Plan:  Principal Problem:  *Partial small bowel obstruction  Continue supportive care with bowel rest, IV fludis and analgesics PRN  Continue NPO  We will repeat abdomen X ray in am  Appreciate surgery following  Active Problems:  Ovarian cancer and peritoneal carcinomatosis  Appreciate oncology following  Continue tamoxifen  Code Status: full code Family Communication: no family at bedside Disposition Plan: home when stable  Manson Passey, MD  [facility] Pager [phone]  If 7PM-7AM, please contact night-coverage [email or web address] Password [email or web address] 02/04/2012, 5:15 PM   LOS: 1 day   Consultants:  Oncology (Dr. Florentina Jenny)  Surgery   Procedures:  None   Antibiotics:  None   HPI/Subjective: No acute overnight events.  Objective: Filed Vitals:   02/04/12 0309 02/04/12 0342 02/04/12 0344 02/04/12 1450  BP: 119/58 146/71  129/67  Pulse: 89 92  85  Temp:  98.8 F (37.1 C)  98.3 F (36.8 C)  TempSrc:  Oral  Oral  Resp: 20 18  16   Height:   5\' 4"  (1.626 m)   Weight:   82.8 kg (182 lb 8.7 oz)   SpO2: 100% 95%  99%    Intake/Output Summary (Last 24 hours) at 02/04/12 1715 Last data filed at 02/04/12 1450  Gross per 24 hour  Intake    727 ml  Output    950 ml  Net   -223 ml    Exam:   General:  Pt is alert, follows commands appropriately, not in acute distress  Cardiovascular: Regular rate and rhythm, S1/S2, no murmurs, no rubs, no gallops  Respiratory: Clear to auscultation bilaterally, no wheezing, no crackles, no rhonchi  Abdomen: tender across mid abdomen, non distended, obese  Extremities: Pulses  DP and PT palpable bilaterally  Neuro: Grossly nonfocal  Data Reviewed: Basic Metabolic Panel:  Lab 02/04/12 8295 02/03/12 2147  NA 132* 136  K 3.2* 3.5  CL 100 105  CO2 19 --  GLUCOSE 104* 122*  BUN 24* 28*  CREATININE 1.00 1.30*  CALCIUM 9.5 --   CBC:  Lab 02/04/12 0500 02/03/12 2147 02/03/12 2130  WBC 9.5 -- 12.8*  HGB 10.1* 11.6* 11.1*  HCT 29.2* 34.0* 32.4*  MCV 81.8 -- 81.6  PLT 256 -- 266   CBG:  Lab 02/04/12 0743  GLUCAP 88    Studies: Ct Abdomen Pelvis W Contrast 02/04/2012  *IMPRESSION:  1.  Examination is positive for partial small bowel obstruction with right lower quadrant transition point. 2.  Ascites and peritoneal nodule consistent with peritoneal carcinomatosis.  When compared with previous exam there is a new nodule within the left upper quadrant of the abdomen. Additionally, the volume of ascites has increased in the interval. In the setting of bowel obstruction the ascites is somewhat nonspecific.  3.  Fatty infiltration of the liver.   Original Report Authenticated By: Signa Kell, M.D.    Dg Abd Acute W/chest 02/03/2012  * IMPRESSION: Findings concerning for partial small bowel obstruction.        Scheduled Meds:   . aspirin EC  81 mg Oral Daily  . ferrous gluconate  324 mg Oral Q breakfast  . heparin  5,000 Units Subcutaneous Q8H  . sertraline  25 mg Oral Daily  . simvastatin  40 mg Oral QHS  . tamoxifen  10 mg Oral BID   Continuous Infusions:   . sodium chloride 75 mL/hr at 02/04/12 1449

## 2012-02-04 NOTE — ED Notes (Signed)
Patient transported to CT 

## 2012-02-04 NOTE — Progress Notes (Signed)
Subjective: Alert, NG just placed and starting to work.  Currently no nausea or problem with NG  Objective: Vital signs in last 24 hours: Temp:  [98.2 F (36.8 C)-99.6 F (37.6 C)] 98.8 F (37.1 C) (11/14 0342) Pulse Rate:  [83-94] 92  (11/14 0342) Resp:  [18-20] 18  (11/14 0342) BP: (119-146)/(55-71) 146/71 mmHg (11/14 0342) SpO2:  [95 %-100 %] 95 % (11/14 0342) Weight:  [182 lb 8.7 oz (82.8 kg)] 182 lb 8.7 oz (82.8 kg) (11/14 0344) Last BM Date: 02/03/12  Intake/Output from previous day: 11/13 0701 - 11/14 0700 In: 727 [I.V.:727] Out: 350 [Urine:250; Emesis/NG output:100] Intake/Output this shift:    General appearance: alert, cooperative and no distress GI: soft, not tender, not really distended. Few hyperactive Bowel sounds  Lab Results:   Basename 02/04/12 0500 02/03/12 2147 02/03/12 2130  WBC 9.5 -- 12.8*  HGB 10.1* 11.6* --  HCT 29.2* 34.0* --  PLT 256 -- 266    BMET  Basename 02/04/12 0500 02/03/12 2147  NA 132* 136  K 3.2* 3.5  CL 100 105  CO2 19 --  GLUCOSE 104* 122*  BUN 24* 28*  CREATININE 1.00 1.30*  CALCIUM 9.5 --   PT/INR No results found for this basename: LABPROT:2,INR:2 in the last 72 hours  No results found for this basename: AST:5,ALT:5,ALKPHOS:5,BILITOT:5,PROT:5,ALBUMIN:5 in the last 168 hours   Lipase     Component Value Date/Time   LIPASE 19 03/23/2010 1010     Studies/Results: Ct Abdomen Pelvis W Contrast  02/04/2012  *RADIOLOGY REPORT*  Clinical Data: History of ovarian cancer.  Evaluate for small bowel obstruction  CT ABDOMEN AND PELVIS WITH CONTRAST  Technique:  Multidetector CT imaging of the abdomen and pelvis was performed following the standard protocol during bolus administration of intravenous contrast.  Contrast: 80mL OMNIPAQUE IOHEXOL 300 MG/ML  SOLN  Comparison: 06/26/2011  Findings:  Lung bases:  No pericardial or pleural effusion.  Lung bases appear clear.  Abdomen/pelvis:  There is diffuse fatty infiltration of the  liver.  No focal liver parenchymal abnormality identified.  Prior cholecystectomy.  The common bile duct is increased in caliber measuring 10 mm.  The pancreas appears within normal limits.  Normal appearance of the spleen.  Normal appearance of the adrenal glands.  Kidneys are unremarkable.  The urinary bladder is within normal limits.  Previous hysterectomy.  No retroperitoneal or mesenteric adenopathy identified.  There is no pelvic or inguinal adenopathy.  The stomach is mildly distended.  Proximal small bowel loops are increased in caliber measuring up to 3.4 cm, image 38.  Transition to decreased caliber small bowel loops are identified at the level of the mid ileum within the right lower quadrant, image 65.  The colon appears within normal limits.  A small amount of perihepatic ascites is noted.  There is also a small to moderate amount of ascites within the pelvis.  Evidence of peritoneal nodularity is noted.  For example, left upper quadrant of the abdomen peritoneal nodules seen on image 36 measuring 1.9 cm, image 36.  This is new from previous exam.  Scattered calcified peritoneal nodules are also identified which likely represent areas of treated tumor.  Bones/Musculoskeletal:  Review of the visualized osseous structures is unremarkable.  No aggressive lytic or sclerotic bone lesions  IMPRESSION:  1.  Examination is positive for partial small bowel obstruction with right lower quadrant transition point. 2.  Ascites and peritoneal nodule consistent with peritoneal carcinomatosis.  When compared with previous exam there  is a new nodule within the left upper quadrant of the abdomen. Additionally, the volume of ascites has increased in the interval. In the setting of bowel obstruction the ascites is somewhat nonspecific.  3.  Fatty infiltration of the liver.   Original Report Authenticated By: Signa Kell, M.D.    Dg Abd Acute W/chest  02/03/2012  *RADIOLOGY REPORT*  Clinical Data: Vomiting, ovarian  cancer.  Question obstruction.  ACUTE ABDOMEN SERIES (ABDOMEN 2 VIEW & CHEST 1 VIEW)  Comparison: CT 06/26/2011  Findings: Right Port-A-Cath in place with the tip in the SVC. Heart and mediastinal contours are within normal limits.  No focal opacities or effusions.  No acute bony abnormality.  There are prominent small bowel loops in the mid and lower abdomen. Scattered air-fluid levels.  Gas is seen within the colon. Findings are concerning for partial small bowel obstruction.  Prior cholecystectomy.  No organomegaly or free air.  IMPRESSION: Findings concerning for partial small bowel obstruction.   Original Report Authenticated By: Charlett Nose, M.D.     Medications:    . [COMPLETED] sodium chloride   Intravenous Once  . antiseptic oral rinse  15 mL Mouth Rinse q12n4p  . aspirin EC  81 mg Oral Daily  . chlorhexidine  15 mL Mouth Rinse BID  . ferrous gluconate  324 mg Oral Q breakfast  . heparin  5,000 Units Subcutaneous Q8H  . [COMPLETED]  HYDROmorphone (DILAUDID) injection  0.5 mg Intravenous Once  . [COMPLETED] ondansetron  4 mg Intravenous Once  . [COMPLETED] ondansetron  8 mg Oral Once  . pantoprazole  40 mg Oral Daily  . potassium chloride  10 mEq Intravenous Q1 Hr x 6  . sertraline  25 mg Oral Daily  . simvastatin  40 mg Oral QHS  . [COMPLETED] sodium chloride  500 mL Intravenous Once  . sodium phosphate  Dextrose 5% IVPB  20 mmol Intravenous Once  . tamoxifen  10 mg Oral BID  . [DISCONTINUED] sodium chloride  3 mL Intravenous Q12H    Assessment/Plan PSBO with nausea and vomiting started yesterday. Ovarian Cancer.ascites, peritoneal nodules consistent with peritoneal carcinomatosis Prior cholecystectomy/hysterectomy Hypertension "Borderline AODM" GERD/IBS Hx Hx of anxiety/depression   Plan:  Conservative medical Rx for now.  Recheck film in AM.    LOS: 1 day    Jaclyn [name] 02/04/2012

## 2012-02-05 ENCOUNTER — Inpatient Hospital Stay (HOSPITAL_COMMUNITY): Payer: Medicare Other

## 2012-02-05 DIAGNOSIS — D649 Anemia, unspecified: Secondary | ICD-10-CM

## 2012-02-05 DIAGNOSIS — Z170 Estrogen receptor positive status [ER+]: Secondary | ICD-10-CM

## 2012-02-05 DIAGNOSIS — C569 Malignant neoplasm of unspecified ovary: Secondary | ICD-10-CM

## 2012-02-05 DIAGNOSIS — C482 Malignant neoplasm of peritoneum, unspecified: Secondary | ICD-10-CM

## 2012-02-05 DIAGNOSIS — K56609 Unspecified intestinal obstruction, unspecified as to partial versus complete obstruction: Principal | ICD-10-CM

## 2012-02-05 LAB — GLUCOSE, CAPILLARY: Glucose-Capillary: 99 mg/dL (ref 70–99)

## 2012-02-05 MED ORDER — PANTOPRAZOLE SODIUM 40 MG IV SOLR
40.0000 mg | Freq: Once | INTRAVENOUS | Status: AC
  Administered 2012-02-05: 40 mg via INTRAVENOUS
  Filled 2012-02-05 (×2): qty 40

## 2012-02-05 MED ORDER — POTASSIUM CHLORIDE CRYS ER 20 MEQ PO TBCR
40.0000 meq | EXTENDED_RELEASE_TABLET | Freq: Once | ORAL | Status: AC
Start: 2012-02-05 — End: 2012-02-05
  Administered 2012-02-05: 40 meq via ORAL
  Filled 2012-02-05: qty 2

## 2012-02-05 MED ORDER — PANTOPRAZOLE SODIUM 40 MG IV SOLR
40.0000 mg | Freq: Every day | INTRAVENOUS | Status: DC
  Administered 2012-02-06 – 2012-02-10 (×5): 40 mg via INTRAVENOUS
  Filled 2012-02-05 (×6): qty 40

## 2012-02-05 NOTE — Progress Notes (Signed)
I have seen and examined the patient and agree with the assessment and plans. Continue conservative treatment.  Phynix Horton A. Magnus Ivan  MD, FACS

## 2012-02-05 NOTE — Consult Note (Signed)
Reason for Consult: bowel obstruction, progressive gyn cancer Referring Physician: hospitalist Other MDs: W.Brewster, V.Bland, J.Mann, K.Reddy  Jaclyn [name] is an 71 y.o. female whom I have followed for ovarian carcinoma since early 2011, on tamoxifen since Oct 2011 for what has been fairly indolent, gradually progressive disease recently. She has PAC in, which has been kept flushed on regular schedule.  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 She had optimal debulking by Dr.Brewster, 10 liters of ascites at that procedure. She was treated with 8 cycles of taxol/carboplatin thru Aug. 2011, with CA125 still ~ 40 at completion of the chemotherapy. The tumor was ER + at 100% and she has continued tamoxifen as above. CA125 nadir was 20 in April 2012, with gradual rise since then tho patient has remained asymptomatic. She saw Dr Nelly Rout last 12-10-11 at which time she continued to feel very well, CA 125 still in 83s as of 01-08-12, as it has been since June 2013.   Patient had continued to feel well until shortly prior to this admission when she developed watery diarrhea, then vomiting. She was brought to ED 02-03-12 where she was found to be dehydrated and imaging showed some bowel obstruction. She was admitted to hospitalist service on 02-04-12 from the ED, and has been on bowel rest with IVF and NG drainage since then.   Patient is feeling a little better this am. She has had no vomiting with NG still in, and chloroseptic spray is helping throat discomfort. She had small amount of diarrhea this am. She feels hungry, still NPO. She feels abdomen is still more full than baseline.She denies shortness of breath or cough and no chest pain.  Allergies:  Allergies  Allergen Reactions  . Lorazepam     " INTOLERANT-2 DAUGHTERS"  . Sulfa Antibiotics     Pt unable to remember reaction   Past Medical History    Diagnosis Date  . Anxiety   . Depression   . Diabetes mellitus     BORDERLINE  . Hypertension   . History of irritable bowel syndrome   . Hx of colonoscopy 0/2010    BY DR. MANN  . Ovarian cancer 04-02-2009    IIIC  . Gastric reflux   hx some psychiatric disorder, had been on lithium 2001 per EMR. Hx C diff 2011  Past Surgical History  Procedure Date  . Vaginal hysterectomy 1980  . Tubial ligation   . Tubal ligation 1963    BILATERAL  . Cholecystectomy     DONE ~ 2007   5 vaginal deliveries Vaginal hysterectomy 1980 Family History  Problem Relation Age of Onset  . Breast cancer Cousin   mother HTN and cardiac disease, father died CVA, son died MI age 32, sister died with lupus and DM. Daughter Marylene Land had ovarian cancer age 6 and colon cancer age 53, [facility] at [facility] and [facility] with chemo.  Social History:  reports that she has never smoked. She has never used smokeless tobacco. She reports that she does not drink alcohol or use illicit drugs. Has lived in [place] all of her life, originally from [place]. Divorced, lives with 2 daughters. Previously worked as Insurance account manager at [employer]. 3 daughters and 1 son living.  Medications: reviewed in EMR  Blood pressure 142/69, pulse 82, temperature 98.1 F (36.7 C), temperature source Oral, resp. rate 18, height 5\' 4"  (1.626 m), weight 182 lb 8.7  oz (82.8 kg), SpO2 100.00%. Awake and alert. Chronic chewing motions which may be related to psychiatric meds, oral mucosa moist and clear. Partial alopecia, baseline. PERRL, not icteric. Lungs clear. PAC site unremarkable, infusing without difficulty. Heart RRR without gallop, clear heart sounds. Abdomen full, not tight, quiet, not tender, no clear mass or organomegaly. LE no pitting edema, cords or tenderness. Moves easily in bed. NG with greenish drainage.    Results for orders placed during the hospital encounter of 02/03/12 (from the past 48 hour(s))  CBC WITH DIFFERENTIAL      Status: Abnormal   Collection Time   02/03/12  9:30 PM      Component Value Range Comment   WBC 12.8 (*) 4.0 - 10.5 K/uL    RBC 3.97  3.87 - 5.11 MIL/uL    Hemoglobin 11.1 (*) 12.0 - 15.0 g/dL    HCT 16.1 (*) 09.6 - 46.0 %    MCV 81.6  78.0 - 100.0 fL    MCH 28.0  26.0 - 34.0 pg    MCHC 34.3  30.0 - 36.0 g/dL    RDW 04.5  40.9 - 81.1 %    Platelets 266  150 - 400 K/uL    Neutrophils Relative 86 (*) 43 - 77 %    Neutro Abs 11.0 (*) 1.7 - 7.7 K/uL    Lymphocytes Relative 11 (*) 12 - 46 %    Lymphs Abs 1.4  0.7 - 4.0 K/uL    Monocytes Relative 3  3 - 12 %    Monocytes Absolute 0.4  0.1 - 1.0 K/uL    Eosinophils Relative 1  0 - 5 %    Eosinophils Absolute 0.1  0.0 - 0.7 K/uL    Basophils Relative 0  0 - 1 %    Basophils Absolute 0.0  0.0 - 0.1 K/uL   POCT I-STAT, CHEM 8     Status: Abnormal   Collection Time   02/03/12  9:47 PM      Component Value Range Comment   Sodium 136  135 - 145 mEq/L    Potassium 3.5  3.5 - 5.1 mEq/L    Chloride 105  96 - 112 mEq/L    BUN 28 (*) 6 - 23 mg/dL    Creatinine, Ser 9.14 (*) 0.50 - 1.10 mg/dL    Glucose, Bld 782 (*) 70 - 99 mg/dL    Calcium, Ion 9.56 (*) 1.13 - 1.30 mmol/L    TCO2 18  0 - 100 mmol/L    Hemoglobin 11.6 (*) 12.0 - 15.0 g/dL    HCT 21.3 (*) 08.6 - 46.0 %   CBC     Status: Abnormal   Collection Time   02/04/12  5:00 AM      Component Value Range Comment   WBC 9.5  4.0 - 10.5 K/uL    RBC 3.57 (*) 3.87 - 5.11 MIL/uL    Hemoglobin 10.1 (*) 12.0 - 15.0 g/dL    HCT 57.8 (*) 46.9 - 46.0 %    MCV 81.8  78.0 - 100.0 fL    MCH 28.3  26.0 - 34.0 pg    MCHC 34.6  30.0 - 36.0 g/dL    RDW 62.9  52.8 - 41.3 %    Platelets 256  150 - 400 K/uL   BASIC METABOLIC PANEL     Status: Abnormal   Collection Time   02/04/12  5:00 AM      Component Value Range Comment  Sodium 132 (*) 135 - 145 mEq/L    Potassium 3.2 (*) 3.5 - 5.1 mEq/L    Chloride 100  96 - 112 mEq/L    CO2 19  19 - 32 mEq/L    Glucose, Bld 104 (*) 70 - 99 mg/dL    BUN  24 (*) 6 - 23 mg/dL    Creatinine, Ser 6.96  0.50 - 1.10 mg/dL    Calcium 9.5  8.4 - 29.5 mg/dL    GFR calc non Af Amer 55 (*) >90 mL/min    GFR calc Af Amer 63 (*) >90 mL/min   GLUCOSE, CAPILLARY     Status: Normal   Collection Time   02/04/12  7:43 AM      Component Value Range Comment   Glucose-Capillary 88  70 - 99 mg/dL    Comment 1 Documented in Chart      Comment 2 Notify RN       CA 125 not repeated as yet this admission. Ct Abdomen Pelvis W Contrast  02/04/2012  *RADIOLOGY REPORT*  Clinical Data: History of ovarian cancer.  Evaluate for small bowel obstruction  CT ABDOMEN AND PELVIS WITH CONTRAST  Technique:  Multidetector CT imaging of the abdomen and pelvis was performed following the standard protocol during bolus administration of intravenous contrast.  Contrast: 80mL OMNIPAQUE IOHEXOL 300 MG/ML  SOLN  Comparison: 06/26/2011  Findings:  Lung bases:  No pericardial or pleural effusion.  Lung bases appear clear.  Abdomen/pelvis:  There is diffuse fatty infiltration of the liver.  No focal liver parenchymal abnormality identified.  Prior cholecystectomy.  The common bile duct is increased in caliber measuring 10 mm.  The pancreas appears within normal limits.  Normal appearance of the spleen.  Normal appearance of the adrenal glands.  Kidneys are unremarkable.  The urinary bladder is within normal limits.  Previous hysterectomy.  No retroperitoneal or mesenteric adenopathy identified.  There is no pelvic or inguinal adenopathy.  The stomach is mildly distended.  Proximal small bowel loops are increased in caliber measuring up to 3.4 cm, image 38.  Transition to decreased caliber small bowel loops are identified at the level of the mid ileum within the right lower quadrant, image 65.  The colon appears within normal limits.  A small amount of perihepatic ascites is noted.  There is also a small to moderate amount of ascites within the pelvis.  Evidence of peritoneal nodularity is noted.   For example, left upper quadrant of the abdomen peritoneal nodules seen on image 36 measuring 1.9 cm, image 36.  This is new from previous exam.  Scattered calcified peritoneal nodules are also identified which likely represent areas of treated tumor.  Bones/Musculoskeletal:  Review of the visualized osseous structures is unremarkable.  No aggressive lytic or sclerotic bone lesions  IMPRESSION:  1.  Examination is positive for partial small bowel obstruction with right lower quadrant transition point. 2.  Ascites and peritoneal nodule consistent with peritoneal carcinomatosis.  When compared with previous exam there is a new nodule within the left upper quadrant of the abdomen. Additionally, the volume of ascites has increased in the interval. In the setting of bowel obstruction the ascites is somewhat nonspecific.  3.  Fatty infiltration of the liver.   Original Report Authenticated By: Signa Kell, M.D.    Dg Abd Acute W/chest  02/03/2012  *RADIOLOGY REPORT*  Clinical Data: Vomiting, ovarian cancer.  Question obstruction.  ACUTE ABDOMEN SERIES (ABDOMEN 2 VIEW & CHEST 1 VIEW)  Comparison: CT 06/26/2011  Findings: Right Port-A-Cath in place with the tip in the SVC. Heart and mediastinal contours are within normal limits.  No focal opacities or effusions.  No acute bony abnormality.  There are prominent small bowel loops in the mid and lower abdomen. Scattered air-fluid levels.  Gas is seen within the colon. Findings are concerning for partial small bowel obstruction.  Prior cholecystectomy.  No organomegaly or free air.  IMPRESSION: Findings concerning for partial small bowel obstruction.   Original Report Authenticated By: Charlett Nose, M.D.    Dg Abd Portable 1v  02/05/2012  *RADIOLOGY REPORT*  Clinical Data: Small bowel obstruction.  PORTABLE ABDOMEN - 1 VIEW  Comparison: 02/03/2012  Findings: Mild gaseous distention of bowel, both small and large bowel.  Findings suggestive of ileus on today's study.   Prior cholecystectomy.  The NG tube is present in the stomach.  IMPRESSION: Mild gaseous distention of both large and small bowel.   Original Report Authenticated By: Charlett Nose, M.D.    PACS images reviewed.     Assessment/Plan: 1.IIIC ovarian carcinoma: some further progression by CT, tho does not appear to have a great deal of involvement as yet. Will consider resuming outpatient chemotherapy after SBO resolved. Dr Nelly Rout of gyn oncology is aware of situation per my correspondence with her at [facility] today. 2.SBO: continuing conservative treatment in hopes that this will resolve itself. 3.PAC in 4.history of some psychiatric problems, possibly moreso than just anxiety/ depression: followed regularly by psychiatry. Seems at usual fairly good baseline to me now. 5.HTN, IBS, borderline DM 6. Hx C diff 2011  Please call if our service can be of help over weekend. I will follow up next week. thanks LIVESAY,LENNIS P 02/05/2012, 8:46 AM  [phone]

## 2012-02-05 NOTE — Progress Notes (Signed)
Subjective: No improvement except she's not vomiting. No flatus, she has some loose PR fluid, but not really BM  Objective: Vital signs in last 24 hours: Temp:  [98.1 F (36.7 C)-98.6 F (37 C)] 98.1 F (36.7 C) (11/15 0516) Pulse Rate:  [82-86] 82  (11/15 0516) Resp:  [16-18] 18  (11/15 0516) BP: (129-143)/(67-77) 142/69 mmHg (11/15 0516) SpO2:  [98 %-100 %] 100 % (11/15 0516) Last BM Date: 02/03/12 1100 ml from NG, yesterday, afebrile, VSS, K+ low yesterday, film today shows mild gaseous distension of both the large and small bowel, does not look better today  Intake/Output from previous day: 11/14 0701 - 11/15 0700 In: 2452.8 [I.V.:2449.8] Out: 1900 [Urine:800; Emesis/NG output:1100] Intake/Output this shift: Total I/O In: -  Out: 300 [Urine:300]  General appearance: alert, cooperative, no distress and says she's starving. GI: soft, distended, sore, but no rebound tenderness, no bowel sounds, no flatus  Lab Results:   Basename 02/04/12 0500 02/03/12 2147 02/03/12 2130  WBC 9.5 -- 12.8*  HGB 10.1* 11.6* --  HCT 29.2* 34.0* --  PLT 256 -- 266    BMET  Basename 02/04/12 0500 02/03/12 2147  NA 132* 136  K 3.2* 3.5  CL 100 105  CO2 19 --  GLUCOSE 104* 122*  BUN 24* 28*  CREATININE 1.00 1.30*  CALCIUM 9.5 --   PT/INR No results found for this basename: LABPROT:2,INR:2 in the last 72 hours  No results found for this basename: AST:5,ALT:5,ALKPHOS:5,BILITOT:5,PROT:5,ALBUMIN:5 in the last 168 hours   Lipase     Component Value Date/Time   LIPASE 19 03/23/2010 1010     Studies/Results: Ct Abdomen Pelvis W Contrast  02/04/2012  *RADIOLOGY REPORT*  Clinical Data: History of ovarian cancer.  Evaluate for small bowel obstruction  CT ABDOMEN AND PELVIS WITH CONTRAST  Technique:  Multidetector CT imaging of the abdomen and pelvis was performed following the standard protocol during bolus administration of intravenous contrast.  Contrast: 80mL OMNIPAQUE IOHEXOL 300  MG/ML  SOLN  Comparison: 06/26/2011  Findings:  Lung bases:  No pericardial or pleural effusion.  Lung bases appear clear.  Abdomen/pelvis:  There is diffuse fatty infiltration of the liver.  No focal liver parenchymal abnormality identified.  Prior cholecystectomy.  The common bile duct is increased in caliber measuring 10 mm.  The pancreas appears within normal limits.  Normal appearance of the spleen.  Normal appearance of the adrenal glands.  Kidneys are unremarkable.  The urinary bladder is within normal limits.  Previous hysterectomy.  No retroperitoneal or mesenteric adenopathy identified.  There is no pelvic or inguinal adenopathy.  The stomach is mildly distended.  Proximal small bowel loops are increased in caliber measuring up to 3.4 cm, image 38.  Transition to decreased caliber small bowel loops are identified at the level of the mid ileum within the right lower quadrant, image 65.  The colon appears within normal limits.  A small amount of perihepatic ascites is noted.  There is also a small to moderate amount of ascites within the pelvis.  Evidence of peritoneal nodularity is noted.  For example, left upper quadrant of the abdomen peritoneal nodules seen on image 36 measuring 1.9 cm, image 36.  This is new from previous exam.  Scattered calcified peritoneal nodules are also identified which likely represent areas of treated tumor.  Bones/Musculoskeletal:  Review of the visualized osseous structures is unremarkable.  No aggressive lytic or sclerotic bone lesions  IMPRESSION:  1.  Examination is positive for partial small  bowel obstruction with right lower quadrant transition point. 2.  Ascites and peritoneal nodule consistent with peritoneal carcinomatosis.  When compared with previous exam there is a new nodule within the left upper quadrant of the abdomen. Additionally, the volume of ascites has increased in the interval. In the setting of bowel obstruction the ascites is somewhat nonspecific.  3.   Fatty infiltration of the liver.   Original Report Authenticated By: Signa Kell, M.D.    Dg Abd Acute W/chest  02/03/2012  *RADIOLOGY REPORT*  Clinical Data: Vomiting, ovarian cancer.  Question obstruction.  ACUTE ABDOMEN SERIES (ABDOMEN 2 VIEW & CHEST 1 VIEW)  Comparison: CT 06/26/2011  Findings: Right Port-A-Cath in place with the tip in the SVC. Heart and mediastinal contours are within normal limits.  No focal opacities or effusions.  No acute bony abnormality.  There are prominent small bowel loops in the mid and lower abdomen. Scattered air-fluid levels.  Gas is seen within the colon. Findings are concerning for partial small bowel obstruction.  Prior cholecystectomy.  No organomegaly or free air.  IMPRESSION: Findings concerning for partial small bowel obstruction.   Original Report Authenticated By: Charlett Nose, M.D.    Dg Abd Portable 1v  02/05/2012  *RADIOLOGY REPORT*  Clinical Data: Small bowel obstruction.  PORTABLE ABDOMEN - 1 VIEW  Comparison: 02/03/2012  Findings: Mild gaseous distention of bowel, both small and large bowel.  Findings suggestive of ileus on today's study.  Prior cholecystectomy.  The NG tube is present in the stomach.  IMPRESSION: Mild gaseous distention of both large and small bowel.   Original Report Authenticated By: Charlett Nose, M.D.     Medications:    . antiseptic oral rinse  15 mL Mouth Rinse q12n4p  . aspirin EC  81 mg Oral Daily  . chlorhexidine  15 mL Mouth Rinse BID  . ferrous gluconate  324 mg Oral Q breakfast  . heparin  5,000 Units Subcutaneous Q8H  . pantoprazole  40 mg Oral Daily  . [COMPLETED] potassium chloride  10 mEq Intravenous Q1 Hr x 6  . [COMPLETED] potassium chloride  40 mEq Oral Once  . [COMPLETED] potassium chloride  40 mEq Oral Once  . sertraline  25 mg Oral Daily  . simvastatin  40 mg Oral QHS  . [COMPLETED] sodium phosphate  Dextrose 5% IVPB  20 mmol Intravenous Once  . tamoxifen  10 mg Oral BID    Assessment/Plan PSBO  with nausea and vomiting started yesterday.  Ovarian Cancer.ascites, peritoneal nodules consistent with peritoneal carcinomatosis  Prior cholecystectomy/hysterectomy  Hypertension  "Borderline AODM"  GERD/IBS Hx  Hx of anxiety/depression   Plan:  No improvement, I think KCl should be replaced thru IV, I don't think it's going to be absorbed orally right now.  Continue NG decompression for now.   LOS: 2 days    Jaclyn [name] 02/05/2012

## 2012-02-05 NOTE — Evaluation (Signed)
Physical Therapy Evaluation Patient Details Name: Jaclyn [name] MRN: [id number] DOB: 06/30/1940 Today's Date: 02/05/2012 Time: 4098-1191 PT Time Calculation (min): 35 min  PT Assessment / Plan / Recommendation Clinical Impression  71 yo female with history of ovarian cancer admitted for vomiting.  Pt currrently has NG tube in place and IV.  She is able to walk well. She used a RW today for initial ambulation but expect she will progress rapidly to ambulation without devide as she improves her general activity. Recommend nursing ambulate  pt with RW  several times over the weekend to improve general strength.  She does not need follow up PT Possibly may need RW for home if she still feels unstead at d/c    PT Assessment  Patient needs continued PT services    Follow Up Recommendations  No PT follow up    Does the patient have the potential to tolerate intense rehabilitation      Barriers to Discharge        Equipment Recommendations  Rolling walker with 5" wheels;Other (comment) (possibly)    Recommendations for Other Services     Frequency Min 3X/week    Precautions / Restrictions     Pertinent Vitals/Pain C/o abdominal pain      Mobility  Bed Mobility Bed Mobility: Supine to Sit;Sit to Supine Supine to Sit: 5: Supervision Sit to Supine: 5: Supervision Transfers Transfers: Sit to Stand;Stand to Sit Sit to Stand: 5: Supervision Stand to Sit: 5: Supervision Details for Transfer Assistance: Supervision to monitor NG tube and IV Ambulation/Gait Ambulation/Gait Assistance: 6: Modified independent (Device/Increase time) Ambulation Distance (Feet): 303 Feet (3,300) Assistive device: Rolling walker Gait Pattern: Step-through pattern Gait velocity: WNL General Gait Details: Pt with some deconditioning from several days of bedrest.  She uses RW for light fingertip balance support Stairs: No Wheelchair Mobility Wheelchair Mobility: No    Shoulder Instructions       Exercises General Exercises - Lower Extremity Ankle Circles/Pumps: AROM;Both;5 reps;Supine Gluteal Sets: AROM;Both;5 reps;Standing Other Exercises Other Exercises: standing balance for 3 minutes to activate core stabalizers   PT Diagnosis: Generalized weakness  PT Problem List: Decreased strength PT Treatment Interventions: Stair training;Gait training   PT Goals Acute Rehab PT Goals PT Goal Formulation: With patient Time For Goal Achievement: 02/12/12 Potential to Achieve Goals: Good Pt will Ambulate: >150 feet;Independently;with least restrictive assistive device PT Goal: Ambulate - Progress: Goal set today Pt will Go Up / Down Stairs: 1-2 stairs;with modified independence;with least restrictive assistive device PT Goal: Up/Down Stairs - Progress: Goal set today  Visit Information  Last PT Received On: 02/05/12 Assistance Needed: +1    Subjective Data  Subjective: My family will take care of me Patient Stated Goal: to go home   Prior Functioning  Home Living Lives With: Family Available Help at Discharge: Family Type of Home: House Home Layout: One level Additional Comments: pt states she has her mother's walker.  Not sure if it has wheels on it or not Prior Function Level of Independence: Independent Able to Take Stairs?: Yes Communication Communication: No difficulties    Cognition  Overall Cognitive Status: Appears within functional limits for tasks assessed/performed Arousal/Alertness: Awake/alert Orientation Level: Appears intact for tasks assessed Behavior During Session: [facility] for tasks performed    Extremity/Trunk Assessment Right Lower Extremity Assessment RLE ROM/Strength/Tone: Within functional levels RLE Sensation: WFL - Light Touch;WFL - Proprioception RLE Coordination: WFL - gross/fine motor Left Lower Extremity Assessment LLE ROM/Strength/Tone: Within functional levels LLE Sensation:  WFL - Light Touch;WFL - Proprioception LLE Coordination: WFL -  gross/fine motor Trunk Assessment Trunk Assessment: Normal   Balance Balance Balance Assessed: Yes Static Standing Balance Static Standing - Balance Support: No upper extremity supported;During functional activity Static Standing - Level of Assistance: 6: Modified independent (Device/Increase time)  End of Session PT - End of Session Activity Tolerance: Patient tolerated treatment well Patient left: in chair  GP     Jaclyn [name], [place] [phone] 02/05/2012, 4:30 PM

## 2012-02-05 NOTE — Progress Notes (Signed)
[facility] PROGRESS NOTE  Jaclyn AGOSTO AVW:[id number] DOB: [birth year] DOA: 02/03/2012 PCP: Laurette Schimke, MD PHD  Brief narrative: 71 year old female with history of ovarian cancer and peritoneal carcinomatosis who presented with intractable nausea and vomiting associated with abdominal pain. Patient was found to have partial SBO.   Assessment/Plan:   Principal Problem:  *Partial small bowel obstruction  Patient reports feeling slightly better We will continue supportive care with bowel rest, IV fludis  Continue zofran 4 mg IV Q 6 hours PRN nausea/vomiting Last NG tube output 1100 cc Continue NPO  Abd xray today still with gaseous distention of large and small bowel Continue protonix 40 mg Q 24 hours IV Pain regimen: dilaudid 0.5 mg Q 3 hours IV PRN severe pain Appreciate surgery following  Active Problems:  Ovarian cancer and peritoneal carcinomatosis  Appreciate oncology following  Continue tamoxifen Dyslipidemia  Continue simvastatin 40 mg PO HS Depression  Continue sertraline 25 mg PO Daily  Code Status: full code  Family Communication: no family at bedside  Disposition Plan: home when stable   Manson Passey, MD  [facility]  Pager [phone]   If 7PM-7AM, please contact night-coverage [email or web address] Password [email or web address] 02/05/2012, 7:12 AM   LOS: 2 days   HPI/Subjective: No acute overnight events.  Objective: Filed Vitals:   02/04/12 0344 02/04/12 1450 02/04/12 2148 02/05/12 0516  BP:  129/67 143/77 142/69  Pulse:  85 86 82  Temp:  98.3 F (36.8 C) 98.6 F (37 C) 98.1 F (36.7 C)  TempSrc:  Oral Oral Oral  Resp:  16 16 18   Height: 5\' 4"  (1.626 m)     Weight: 82.8 kg (182 lb 8.7 oz)     SpO2:  99% 98% 100%    Intake/Output Summary (Last 24 hours) at 02/05/12 3086 Last data filed at 02/05/12 5784  Gross per 24 hour  Intake 2452.75 ml  Output   1900 ml  Net 552.75 ml    Exam:   General:  Pt not in acute distress; NG tube in  place  Cardiovascular: Regular rate and rhythm, S1/S2, no murmurs, no rubs, no gallops  Respiratory: Clear to auscultation bilaterally, no wheezing, no crackles, no rhonchi  Abdomen: hard to palpation and tender across mid abdomen, no rebound tenderness or guarding  Extremities: No edema, pulses DP and PT palpable bilaterally  Neuro: Grossly nonfocal  Data Reviewed: Basic Metabolic Panel:  Lab 02/04/12 6962 02/03/12 2147  NA 132* 136  K 3.2* 3.5  CL 100 105  CO2 19 --  GLUCOSE 104* 122*  BUN 24* 28*  CREATININE 1.00 1.30*  CALCIUM 9.5 --   CBC:  Lab 02/04/12 0500 02/03/12 2147 02/03/12 2130  WBC 9.5 -- 12.8*  HGB 10.1* 11.6* 11.1*  HCT 29.2* 34.0* 32.4*  MCV 81.8 -- 81.6  PLT 256 -- 266   CBG:  Lab 02/04/12 0743  GLUCAP 88    Studies: Ct Abdomen Pelvis W Contrast 02/04/2012  *  IMPRESSION:  1.  Examination is positive for partial small bowel obstruction with right lower quadrant transition point. 2.  Ascites and peritoneal nodule consistent with peritoneal carcinomatosis.  When compared with previous exam there is a new nodule within the left upper quadrant of the abdomen. Additionally, the volume of ascites has increased in the interval. In the setting of bowel obstruction the ascites is somewhat nonspecific.  3.  Fatty infiltration of the liver.   Original Report Authenticated By: Signa Kell, M.D.    Dg Abd Acute  W/chest 02/03/2012  * IMPRESSION: Findings concerning for partial small bowel obstruction.   Original Report Authenticated By: Charlett Nose, M.D.    Dg Abd Portable 1v 02/05/2012  *  IMPRESSION: Mild gaseous distention of both large and small bowel.        Scheduled Meds:   . aspirin EC  81 mg Oral Daily  . ferrous gluconate  324 mg Oral Q breakfast  . heparin  5,000 Units Subcutaneous Q8H  . pantoprazole  40 mg Oral Daily  . potassium chloride  40 mEq Oral Once  . sertraline  25 mg Oral Daily  . simvastatin  40 mg Oral QHS  . tamoxifen  10 mg  Oral BID   Continuous Infusions:   . sodium chloride 75 mL/hr at 02/04/12 1449

## 2012-02-06 MED ORDER — POTASSIUM CHLORIDE 10 MEQ/100ML IV SOLN: 10.0000 meq | INTRAVENOUS | Status: DC

## 2012-02-06 NOTE — Progress Notes (Signed)
  Subjective: She says that she feels better but still distended.  Had two episodes of loose stools yesterday.  Objective: Vital signs in last 24 hours: Temp:  [98.4 F (36.9 C)-99.2 F (37.3 C)] 98.4 F (36.9 C) (11/16 0610) Pulse Rate:  [77-86] 77  (11/16 0610) Resp:  [16-20] 16  (11/16 0610) BP: (129-134)/(59-64) 134/59 mmHg (11/16 0610) SpO2:  [98 %-99 %] 98 % (11/16 0610) Last BM Date: 02/03/12  Intake/Output from previous day: 11/15 0701 - 11/16 0700 In: 1224.5 [I.V.:1224.5] Out: 1400 [Urine:900; Emesis/NG output:500] Intake/Output this shift:    General appearance: alert, cooperative and no distress GI: soft, mild left sided tenderness, mild distension, no peritoneal signs.  NG with bilious output  Lab Results:   Basename 02/04/12 0500 02/03/12 2147 02/03/12 2130  WBC 9.5 -- 12.8*  HGB 10.1* 11.6* --  HCT 29.2* 34.0* --  PLT 256 -- 266   BMET  Basename 02/04/12 0500 02/03/12 2147  NA 132* 136  K 3.2* 3.5  CL 100 105  CO2 19 --  GLUCOSE 104* 122*  BUN 24* 28*  CREATININE 1.00 1.30*  CALCIUM 9.5 --   PT/INR No results found for this basename: LABPROT:2,INR:2 in the last 72 hours ABG No results found for this basename: PHART:2,PCO2:2,PO2:2,HCO3:2 in the last 72 hours  Studies/Results: Dg Abd Portable 1v  02/05/2012  *RADIOLOGY REPORT*  Clinical Data: Small bowel obstruction.  PORTABLE ABDOMEN - 1 VIEW  Comparison: 02/03/2012  Findings: Mild gaseous distention of bowel, both small and large bowel.  Findings suggestive of ileus on today's study.  Prior cholecystectomy.  The NG tube is present in the stomach.  IMPRESSION: Mild gaseous distention of both large and small bowel.   Original Report Authenticated By: Charlett Nose, M.D.     Anti-infectives: Anti-infectives    None      Assessment/Plan: s/p * No surgery found * continue NG decompression.  she is not worse.  abdomen soft with mild tenderness, wbc normal and xrays with small and large bowel  gas.  hopefully we can manage this nonoperatively  LOS: 3 days    Lodema Pilot DAVID 02/06/2012

## 2012-02-06 NOTE — Progress Notes (Addendum)
[facility] PROGRESS NOTE  Jaclyn [name] ZOX:[id number] DOB: Aug 04, 1940 DOA: 02/03/2012 PCP: Laurette Schimke, MD PHD  Brief narrative: 71 year old female with history of ovarian cancer and peritoneal carcinomatosis who presented with intractable nausea and vomiting associated with abdominal pain. Patient was found to have partial SBO.   Assessment/Plan:   Principal Problem:  *Partial small bowel obstruction  Continue supportive care with bowel rest, IV fluids (NS @ 75 cc/hr)  Continue zofran 4 mg IV Q 6 hours PRN nausea/vomiting  Last NG tube output 500 cc  Continue NPO  Abd xray done 02/05/2012 with findings of gaseous distention of large and small bowel  Continue protonix 40 mg Q 24 hours IV  Pain regimen: dilaudid 0.5 mg Q 3 hours IV PRN severe pain  Appreciate surgery following  Active Problems:  Ovarian cancer and peritoneal carcinomatosis  Appreciate oncology following  Will continue tamoxifen Dyslipidemia  Will continue simvastatin 40 mg PO HS Depression  Will continue sertraline 25 mg PO Daily  Code Status: full code  Family Communication: no family at bedside  Disposition Plan: home when stable   Manson Passey, MD  [facility]  Pager [phone]  Consultants:  Surgery  Oncology (Dr. Darrold Span)   If 7PM-7AM, please contact night-coverage [email or web address] Password [email or web address] 02/06/2012, 10:30 AM   LOS: 3 days   HPI/Subjective: No acute events overnight.  Objective: Filed Vitals:   02/05/12 0516 02/05/12 1446 02/05/12 2142 02/06/12 0610  BP: 142/69 129/63 134/64 134/59  Pulse: 82 80 86 77  Temp: 98.1 F (36.7 C) 99.2 F (37.3 C) 99 F (37.2 C) 98.4 F (36.9 C)  TempSrc: Oral Oral Oral Oral  Resp: 18 20 18 16   Height:      Weight:      SpO2: 100% 99% 99% 98%    Intake/Output Summary (Last 24 hours) at 02/06/12 1030 Last data filed at 02/06/12 0550  Gross per 24 hour  Intake 1224.5 ml  Output   1100 ml  Net  124.5 ml    Exam:   General:  Pt is  alert, follows commands appropriately, not in acute distress  Cardiovascular: Regular rate and rhythm, S1/S2, no murmurs, no rubs, no gallops  Respiratory: Clear to auscultation bilaterally, no wheezing, no crackles, no rhonchi  Abdomen: Soft, distended and tender somewhat across mid abdomen, non distended, bowel sounds present, no guarding  Extremities: No edema, pulses DP and PT palpable bilaterally  Neuro: Grossly nonfocal  Data Reviewed: Basic Metabolic Panel:  Lab 02/04/12 8295 02/03/12 2147  NA 132* 136  K 3.2* 3.5  CL 100 105  CO2 19 --  GLUCOSE 104* 122*  BUN 24* 28*  CREATININE 1.00 1.30*  CALCIUM 9.5 --   CBC:  Lab 02/04/12 0500 02/03/12 2147 02/03/12 2130  WBC 9.5 -- 12.8*  HGB 10.1* 11.6* 11.1*  HCT 29.2* 34.0* 32.4*  MCV 81.8 -- 81.6  PLT 256 -- 266   CBG:  Lab 02/06/12 0612 02/05/12 0812 02/04/12 0743  GLUCAP 83 99 88    Studies: Dg Abd Portable 1v 02/05/2012  * IMPRESSION: Mild gaseous distention of both large and small bowel.   Original Report Authenticated By: Charlett Nose, M.D.     Scheduled Meds:   . aspirin EC  81 mg Oral Daily  . ferrous gluconate  324 mg Oral Q breakfast  . heparin  5,000 Units Subcutaneous Q8H  . pantoprazole  40 mg Intravenous QHS  . sertraline  25 mg Oral Daily  . simvastatin  40 mg Oral QHS  . tamoxifen  10 mg Oral BID   Continuous Infusions:   . sodium chloride 75 mL/hr at 02/05/12 2219

## 2012-02-06 NOTE — Plan of Care (Signed)
Problem: Phase II Progression Outcomes Goal: Progress activity as tolerated unless otherwise ordered Outcome: Progressing Pt walked up and down hallway today with walker. Did not need assistance. Had steady gate.   Problem: Discharge Progression Outcomes Goal: Tolerating diet Outcome: Progressing Pt vocalizing increase in appetite (although NPO). Managing ice chips well.

## 2012-02-07 DIAGNOSIS — D638 Anemia in other chronic diseases classified elsewhere: Secondary | ICD-10-CM | POA: Diagnosis present

## 2012-02-07 DIAGNOSIS — C800 Disseminated malignant neoplasm, unspecified: Secondary | ICD-10-CM

## 2012-02-07 LAB — BASIC METABOLIC PANEL
CO2: 21 mEq/L (ref 19–32)
Calcium: 8.9 mg/dL (ref 8.4–10.5)
GFR calc Af Amer: 77 mL/min — ABNORMAL LOW (ref >90)
Sodium: 137 mEq/L (ref 135–145)

## 2012-02-07 LAB — CBC
MCH: 28 pg (ref 26.0–34.0)
Platelets: 221 10*3/uL (ref 150–400)
RBC: 3.29 MIL/uL — ABNORMAL LOW (ref 3.87–5.11)
WBC: 9.5 10*3/uL (ref 4.0–10.5)

## 2012-02-07 LAB — GLUCOSE, CAPILLARY: Glucose-Capillary: 89 mg/dL (ref 70–99)

## 2012-02-07 NOTE — Progress Notes (Signed)
[facility] PROGRESS NOTE  Jaclyn [name] MVH:[id number] DOB: Jul 27, 1940 DOA: 02/03/2012 PCP: Laurette Schimke, MD PHD  Brief narrative:  71 year old female with history of ovarian cancer and peritoneal carcinomatosis who presented with intractable nausea and vomiting associated with abdominal pain. Patient was found to have partial SBO.   Assessment/Plan:   Principal Problem:  *Partial small bowel obstruction  Continue supportive care with NG decompression, IV fluids, antiemetics Continue NPO  Abd xray done 02/05/2012 showed gaseous distention of large and small bowel  Will continue protonix 40 mg Q 24 hours IV  Pain regimen: dilaudid 0.5 mg Q 3 hours IV PRN severe pain  Appreciate surgery following  Active Problems:  Ovarian cancer and peritoneal carcinomatosis  Appreciate oncology following  Will continue tamoxifen Dyslipidemia  Simvastatin on hold due to dysphagia Depression  Will continue sertraline 25 mg PO Daily  Code Status: full code  Family Communication: no family at bedside  Disposition Plan: home when stable   Manson Passey, MD  [facility]  Pager [phone]   Consultants:  Surgery  Oncology (Dr. Darrold Span)  If 7PM-7AM, please contact night-coverage [email or web address] Password [email or web address] 02/07/2012, 8:45 AM   LOS: 4 days   HPI/Subjective: No acute events overnight.  Objective: Filed Vitals:   02/06/12 0610 02/06/12 1343 02/06/12 2229 02/07/12 0608  BP: 134/59 140/64 148/70 138/61  Pulse: 77 88 101 82  Temp: 98.4 F (36.9 C) 100 F (37.8 C) 99.6 F (37.6 C) 98.3 F (36.8 C)  TempSrc: Oral Oral Oral Oral  Resp: 16 18 20 16   Height:      Weight:      SpO2: 98% 99% 100% 99%    Intake/Output Summary (Last 24 hours) at 02/07/12 0845 Last data filed at 02/07/12 0554  Gross per 24 hour  Intake   1805 ml  Output    850 ml  Net    955 ml    Exam:   General:  Pt is alert, follows commands appropriately, not in acute distress  Cardiovascular: Regular  rate and rhythm, S1/S2, no murmurs, no rubs, no gallops  Respiratory: Clear to auscultation bilaterally, no wheezing, no crackles, no rhonchi  Abdomen: distended and mildly tender across mid abdomen; (+) BS  Extremities: No edema, pulses DP and PT palpable bilaterally  Neuro: Grossly nonfocal  Data Reviewed: Basic Metabolic Panel:  Lab 02/07/12 0102 02/04/12 0500 02/03/12 2147  NA 137 132* 136  K 3.6 3.2* 3.5  CL 105 100 105  CO2 21 19 --  GLUCOSE 76 104* 122*  BUN 12 24* 28*  CREATININE 0.85 1.00 1.30*  CALCIUM 8.9 9.5 --   CBC:  Lab 02/07/12 0605 02/04/12 0500 02/03/12 2147 02/03/12 2130  WBC 9.5 9.5 -- 12.8*  HGB 9.2* 10.1* 11.6* 11.1*  HCT 27.6* 29.2* 34.0* 32.4*  MCV 83.9 81.8 -- 81.6  PLT 221 256 -- 266   CBG:  Lab 02/07/12 0607 02/06/12 0612 02/05/12 0812 02/04/12 0743  GLUCAP 89 83 99 88    Studies: No results found.  Scheduled Meds:  . heparin  5,000 Units Subcutaneous Q8H  . pantoprazole  40 mg Intravenous QHS  . sertraline  25 mg Oral Daily  . tamoxifen  10 mg Oral BID   Continuous Infusions:  . sodium chloride 75 mL/hr at 02/06/12 2304

## 2012-02-07 NOTE — Progress Notes (Signed)
  Subjective: She says that she feels better.  +flatus +liquid BM yesterday  Objective: Vital signs in last 24 hours: Temp:  [98.3 F (36.8 C)-100 F (37.8 C)] 98.3 F (36.8 C) (11/17 7829) Pulse Rate:  [82-101] 82  (11/17 0608) Resp:  [16-20] 16  (11/17 0608) BP: (138-148)/(61-70) 138/61 mmHg (11/17 0608) SpO2:  [99 %-100 %] 99 % (11/17 5621) Last BM Date: 02/03/12  Intake/Output from previous day: 11/16 0701 - 11/17 0700 In: 1805 [I.V.:1805] Out: 1050 [Urine:700; Emesis/NG output:350] Intake/Output this shift:    General appearance: alert, cooperative and no distress GI: soft, no apparent tenderness on exam, she does have some mild tympany and distension, no peritoneal signs. NG with bilious output.  Lab Results:   [facility] 02/07/12 0605  WBC 9.5  HGB 9.2*  HCT 27.6*  PLT 221   BMET  Basename 02/07/12 0605  NA 137  K 3.6  CL 105  CO2 21  GLUCOSE 76  BUN 12  CREATININE 0.85  CALCIUM 8.9   PT/INR No results found for this basename: LABPROT:2,INR:2 in the last 72 hours ABG No results found for this basename: PHART:2,PCO2:2,PO2:2,HCO3:2 in the last 72 hours  Studies/Results: No results found.  Anti-infectives: Anti-infectives    None      Assessment/Plan: s/p * No surgery found * I am concerned that this is a possible malignant bowel obstruction and think that it is likely that she will need surgery for relief.  I discussed surgery with her today and expressed my concerns but she would like to give it some more time to resolve since she is feeling better today (although she looks about the same to me).  Though I think that she will come to surgery, since she has no increase in pain (and she says is actually better today), no fevers or leukocytosis, then I is probably reasonable to wait as she desires. Continue NG and NPO LOS: 4 days    Lodema Pilot DAVID 02/07/2012

## 2012-02-08 ENCOUNTER — Telehealth: Payer: Self-pay | Admitting: Oncology

## 2012-02-08 DIAGNOSIS — D638 Anemia in other chronic diseases classified elsewhere: Secondary | ICD-10-CM

## 2012-02-08 LAB — GLUCOSE, CAPILLARY
Glucose-Capillary: 68 mg/dL — ABNORMAL LOW (ref 70–99)
Glucose-Capillary: 56 mg/dL — ABNORMAL LOW (ref 70–99)

## 2012-02-08 MED ORDER — DEXTROSE 50 % IV SOLN
INTRAVENOUS | Status: AC
  Administered 2012-02-08: 25 mL via INTRAVENOUS
  Filled 2012-02-08: qty 50

## 2012-02-08 MED ORDER — DEXTROSE 50 % IV SOLN: 25.0000 mL | Freq: Once | INTRAVENOUS | Status: AC | PRN

## 2012-02-08 MED ORDER — DEXTROSE 50 % IV SOLN
25.0000 mL | Freq: Once | INTRAVENOUS | Status: AC | PRN
  Administered 2012-02-08 (×2): 25 mL via INTRAVENOUS

## 2012-02-08 MED ORDER — ZOLPIDEM TARTRATE 5 MG PO TABS
5.0000 mg | ORAL_TABLET | Freq: Once | ORAL | Status: AC
  Administered 2012-02-08: 5 mg via ORAL
  Filled 2012-02-08: qty 1

## 2012-02-08 MED ORDER — DEXTROSE-NACL 5-0.9 % IV SOLN
INTRAVENOUS | Status: DC
  Administered 2012-02-08 – 2012-02-09 (×3): via INTRAVENOUS

## 2012-02-08 MED ORDER — DEXTROSE 50 % IV SOLN
INTRAVENOUS | Status: AC
  Filled 2012-02-08: qty 50

## 2012-02-08 NOTE — Progress Notes (Signed)
No complaints Not distended  Clamp NG tube Continue conservative measures, but she may still need surgery.  Wilmon Arms. Corliss Skains, MD, [facility]  02/08/2012 10:58 AM

## 2012-02-08 NOTE — Progress Notes (Signed)
No residual noted during NG check.

## 2012-02-08 NOTE — Progress Notes (Signed)
Patient ID: Jaclyn [name], female   DOB: 02-03-1941, 71 y.o.   MRN: [id number]    Subjective: Pt reports feeling ok, denies n/v or abd pain.  +liquid stool yesterday but none today, +flatus   Objective: Vital signs in last 24 hours: Temp:  [98.8 F (37.1 C)-100.1 F (37.8 C)] 98.9 F (37.2 C) (11/18 0642) Pulse Rate:  [84-88] 84  (11/18 0642) Resp:  [16-18] 16  (11/18 0642) BP: (135-149)/(61-64) 138/64 mmHg (11/18 0642) SpO2:  [96 %-100 %] 96 % (11/18 0642) Last BM Date: 02/03/12  Intake/Output from previous day: 11/17 0701 - 11/18 0700 In: 0  Out: 400 [Urine:400] Intake/Output this shift:    General appearance: alert, cooperative and no distress GI: soft, non tender, mild distension, no peritoneal signs. NG with bilious output.  Lab Results:   [facility] 02/07/12 0605  WBC 9.5  HGB 9.2*  HCT 27.6*  PLT 221   BMET  Basename 02/07/12 0605  NA 137  K 3.6  CL 105  CO2 21  GLUCOSE 76  BUN 12  CREATININE 0.85  CALCIUM 8.9   PT/INR No results found for this basename: LABPROT:2,INR:2 in the last 72 hours ABG No results found for this basename: PHART:2,PCO2:2,PO2:2,HCO3:2 in the last 72 hours  Studies/Results: No results found.  Anti-infectives: Anti-infectives    None      Assessment/Plan: 1. SBO: as per Dr. Delice Lesch note-patient reluctant to consider surgery and wants to manage conservatively, she does not have peritoneal symptoms or leukocytosis, could try clamping trial of NGT today since she is having BMs and flatus  --clamp NGT but keep NPO  --check residuals later today  --still may need surgical intervention    LOS: 5 days    Tonica Brasington 02/08/2012

## 2012-02-08 NOTE — Progress Notes (Signed)
Hypoglycemic Event  CBG: 68  Treatment: D50 IV 25 mL  Symptoms: None  Follow-up CBG: Time:0949 CBG Result:89  Possible Reasons for Event: Inadequate meal intake  Comments/MD notified:Hypoglycemic Protocol Ordered    Jaclyn [name]  Remember to initiate Hypoglycemia Order Set & complete

## 2012-02-08 NOTE — Telephone Encounter (Signed)
Per 11/15 pof for new pt (#[id number]) move S. Burkman to 12:30 pm to open up to 12 pm slot. Pt's appt time for 11:15 am remains the same due to pt having lb/fu and then LL. Schedules could not accommodate moving lb/flush closer to 12:30 pm f/u. lmonvm for pt confirming appt for 12/4.

## 2012-02-08 NOTE — Progress Notes (Signed)
PT Cancellation Note  Patient Details Name: Jaclyn [name] MRN: [id number] DOB: 04/07/1940   Cancelled Treatment:    Reason Eval/Treat Not Completed: Other (comment) (pt had a long walk with nursing today)   Donnetta Hail 02/08/2012, 3:20 PM

## 2012-02-08 NOTE — Progress Notes (Signed)
[facility] PROGRESS NOTE  Jaclyn [name] XBJ:[id number] DOB: 08-Apr-1940 DOA: 02/03/2012 PCP: Laurette Schimke, MD PHD  Brief narrative: 71 year old female with history of ovarian cancer and peritoneal carcinomatosis who presented with intractable nausea and vomiting associated with abdominal pain. Patient was found to have partial SBO. At this time per surgery NG-tube clamped but may still need surgery.  Assessment/Plan:   Principal Problem:  *Partial small bowel obstruction  Continue supportive care with IV fluids, NS @ 75 cc/hr and antiemetics PRN NG tube clamped Per surgery, the patient may still need surgery Continue NPO  Abd xray done 02/05/2012 showed gaseous distention of large and small bowel  Continue protonix 40 mg Q 24 hours IV  Pain regimen: dilaudid 0.5 mg Q 3 hours IV PRN severe pain   Active Problems:  Ovarian cancer and peritoneal carcinomatosis  Appreciate oncology following  Will continue tamoxifen Dyslipidemia  Simvastatin on hold due to dysphagia Depression  Will continue sertraline 25 mg PO Daily  Code Status: full code  Family Communication: no family at bedside  Disposition Plan: home when stable   Manson Passey, MD  [facility]  Pager [phone]   Consultants:  Surgery  Oncology (Dr. Darrold Span)  If 7PM-7AM, please contact night-coverage [email or web address] Password [email or web address] 02/08/2012, 3:04 PM   LOS: 5 days   HPI/Subjective: No acute events overnight.  Objective: Filed Vitals:   02/07/12 1421 02/07/12 2200 02/08/12 0642 02/08/12 1349  BP: 135/62 149/61 138/64 143/67  Pulse: 88 88 84 79  Temp: 98.8 F (37.1 C) 100.1 F (37.8 C) 98.9 F (37.2 C) 98.9 F (37.2 C)  TempSrc: Oral Oral Oral Oral  Resp: 18 16 16 16   Height:      Weight:      SpO2: 100% 98% 96% 100%    Intake/Output Summary (Last 24 hours) at 02/08/12 1504 Last data filed at 02/08/12 1300  Gross per 24 hour  Intake      0 ml  Output    800 ml  Net   -800 ml     Exam:   General:  Pt is alert, follows commands appropriately, not in acute distress  Cardiovascular: Regular rate and rhythm, S1/S2, no murmurs, no rubs, no gallops  Respiratory: Clear to auscultation bilaterally, no wheezing, no crackles, no rhonchi  Abdomen: Soft, distended but not tender, bowel sounds present, no guarding  Extremities: No edema, pulses DP and PT palpable bilaterally  Neuro: Grossly nonfocal  Data Reviewed: Basic Metabolic Panel:  Lab 02/07/12 4696 02/04/12 0500 02/03/12 2147  NA 137 132* 136  K 3.6 3.2* 3.5  CL 105 100 105  CO2 21 19 --  GLUCOSE 76 104* 122*  BUN 12 24* 28*  CREATININE 0.85 1.00 1.30*  CALCIUM 8.9 9.5 --   CBC:  Lab 02/07/12 0605 02/04/12 0500 02/03/12 2147 02/03/12 2130  WBC 9.5 9.5 -- 12.8*  HGB 9.2* 10.1* 11.6* 11.1*  HCT 27.6* 29.2* 34.0* 32.4*  MCV 83.9 81.8 -- 81.6  PLT 221 256 -- 266   CBG:  Lab 02/08/12 1358 02/08/12 1330 02/08/12 0949 02/08/12 0924 02/08/12 0644  GLUCAP 88 56* 89 60* 68*   Scheduled Meds:   . dextrose      . heparin  5,000 Units Subcutaneous Q8H  . pantoprazole   40 mg Intravenous QHS  . sertraline  25 mg Oral Daily  . tamoxifen  10 mg Oral BID   Continuous Infusions:   . sodium chloride 75 mL/hr at 02/08/12 1258

## 2012-02-08 NOTE — Progress Notes (Signed)
Hypoglycemic Event  CBG: 56  Treatment: D50 IV 25 mL  Symptoms: None  Follow-up CBG: Time:1358 CBG Result:88  Possible Reasons for Event: Inadequate meal intake  Comments/MD notified: Dr. Elisabeth Pigeon notified    Katina Degree Dionne  Remember to initiate Hypoglycemia Order Set & complete

## 2012-02-08 NOTE — Progress Notes (Signed)
  02/08/2012, 5:28 PM  Hospital day: 6 Antibiotics: none Chemotherapy: none now    Subjective: Tolerating NG clamped since this am, without any nausea or vomiting. Has passed some flatus, also apparently had some stool on 11-16 pm. Walked in hall and sat up in chair today. NG is making it difficult for her to swallow, but is tolerating small amount of ice chips today.   Objective: Vital signs in last 24 hours: Blood pressure 143/67, pulse 79, temperature 98.9 F (37.2 C), temperature source Oral, resp. rate 16, height 5\' 4"  (1.626 m), weight 182 lb 8.7 oz (82.8 kg), SpO2 100.00%.   Intake/Output from previous day: 11/17 0701 - 11/18 0700 In: 0  Out: 400 [Urine:400] Intake/Output this shift: Total I/O In: 600 [I.V.:600] Out: 400 [Urine:400]  Physical exam: alert, very pleasant as always, NAD supine in bed on RA with NG clamped. Oral mucosa moist. PAC infusing without difficulty. Abd full but not tight, quiet, not tender. LE no pitting edema, cords, tenderness. Moves easily in bed.  Lab Results:  [facility] 02/07/12 0605  WBC 9.5  HGB 9.2*  HCT 27.6*  PLT 221   BMET  Basename 02/07/12 0605  NA 137  K 3.6  CL 105  CO2 21  GLUCOSE 76  BUN 12  CREATININE 0.85  CALCIUM 8.9    Studies/Results: Last abd Xray 02-05-12 and I have ordered for AM  I have ordered abd [place] as she may feel better with paracentesis if significant ascites.  Assessment/Plan: 1. SBO:  Clinically seems to be doing some better, tolerating NG clamped. Recheck abd Xray in am. Hopefully will be able to try po nutrition soon, otherwise will need to consider TNA. 2.progressive ovarian carcinoma: expect we will resume some systemic chemotherapy as outpatient after bowel obstruction resolves 3.PAC in 4. HTN, IBS, hx borderline DM 5.multifactorial anemia: note patient is not Jehovah's Witness however daughters are; patient has been comfortable with PRBCs previously tho this concerns  daughters.   LIVESAY,LENNIS P  [phone]

## 2012-02-09 ENCOUNTER — Inpatient Hospital Stay (HOSPITAL_COMMUNITY): Payer: Medicare Other

## 2012-02-09 LAB — CBC
HCT: 26.2 % — ABNORMAL LOW (ref 36.0–46.0)
Hemoglobin: 8.8 g/dL — ABNORMAL LOW (ref 12.0–15.0)
MCH: 27.9 pg (ref 26.0–34.0)
MCHC: 33.6 g/dL (ref 30.0–36.0)
MCV: 83.2 fL (ref 78.0–100.0)

## 2012-02-09 LAB — BASIC METABOLIC PANEL
BUN: 7 mg/dL (ref 6–23)
Calcium: 8.8 mg/dL (ref 8.4–10.5)
Creatinine, Ser: 0.73 mg/dL (ref 0.50–1.10)
GFR calc non Af Amer: 83 mL/min — ABNORMAL LOW (ref >90)
Glucose, Bld: 133 mg/dL — ABNORMAL HIGH (ref 70–99)
Sodium: 137 mEq/L (ref 135–145)

## 2012-02-09 MED ORDER — POTASSIUM CHLORIDE 10 MEQ/100ML IV SOLN
10.0000 meq | INTRAVENOUS | Status: AC
  Administered 2012-02-09 (×3): 10 meq via INTRAVENOUS
  Filled 2012-02-09 (×3): qty 100

## 2012-02-09 MED ORDER — ACETAMINOPHEN 325 MG PO TABS: 650.0000 mg | ORAL_TABLET | ORAL | Status: DC | PRN

## 2012-02-09 NOTE — Progress Notes (Signed)
[facility] PROGRESS NOTE  Jaclyn [name] ZOX:[id number] DOB: 1940/12/08 DOA: 02/03/2012 PCP: Laurette Schimke, MD PHD  Brief narrative: 71 year old female with history of ovarian cancer and peritoneal carcinomatosis who presented with intractable nausea and vomiting associated with abdominal pain. Patient was found to have partial SBO. Abdominal x-ray was repeated today and it shows interval improvement in small bowel obstruction. We will discontinue NG tube today 02/09/2012 and advanced diet to clear liquids.  Assessment/Plan:   Principal Problem:  *Partial small bowel obstruction  Discontinue NG tube Continue IV fluids and antiemetics as needed Advance diet to clear liquid Appreciate surgery following Abdominal x-ray done 02/09/2012 with interval improvement in small bowel obstruction Continue protonix 40 mg Q 24 hours IV  Pain regimen: dilaudid 0.5 mg Q 3 hours IV PRN severe pain   Active Problems:  Ovarian cancer and peritoneal carcinomatosis  Appreciate oncology following  Will continue tamoxifen Dyslipidemia  Simvastatin on hold due to dysphagia Depression  Will continue sertraline 25 mg PO Daily Hypokalemia  Secondary to GI losses  Repleted today  Followup BMP in the morning   Code Status: full code  Family Communication: no family at bedside  Disposition Plan: home when stable   Manson Passey, MD  [facility]  Pager [phone]   Consultants:  Surgery  Oncology (Dr. Darrold Span)  Procedure  NG tube placement which is now discontinued, 02/09/2012  Antibiotic  None   If 7PM-7AM, please contact night-coverage [email or web address] Password [email or web address] 02/09/2012, 2:13 PM   LOS: 6 days   HPI/Subjective: No acute events overnight.  Objective: Filed Vitals:   02/08/12 1349 02/08/12 2059 02/09/12 0458 02/09/12 0815  BP: 143/67 159/77 151/61 142/62  Pulse: 79 82 81 74  Temp: 98.9 F (37.2 C) 99.6 F (37.6 C) 98.9 F (37.2 C) 98.9 F (37.2 C)  TempSrc: Oral Oral  Oral Oral  Resp: 16 18 18 16   Height:      Weight:      SpO2: 100% 100% 97% 99%    Intake/Output Summary (Last 24 hours) at 02/09/12 1413 Last data filed at 02/09/12 0900  Gross per 24 hour  Intake   1607 ml  Output    500 ml  Net   1107 ml    Exam:   General:  Pt is alert, follows commands appropriately, not in acute distress  Cardiovascular: Regular rate and rhythm, S1/S2, no murmurs, no rubs, no gallops  Respiratory: Clear to auscultation bilaterally, no wheezing, no crackles, no rhonchi  Abdomen: Soft, nontender, somewhat distended, positive bowel sounds  Extremities: No edema, pulses DP and PT palpable bilaterally  Neuro: Grossly nonfocal  Data Reviewed: Basic Metabolic Panel:  Lab 02/09/12 8295 02/07/12 0605 02/04/12 0500 02/03/12 2147  NA 137 137 132* 136  K 3.1* 3.6 3.2* 3.5  CL 105 105 100 105  CO2 23 21 19  --  GLUCOSE 133* 76 104* 122*  BUN 7 12 24* 28*  CREATININE 0.73 0.85 1.00 1.30*  CALCIUM 8.8 8.9 9.5 --   CBC:  Lab 02/09/12 0500 02/07/12 0605 02/04/12 0500 02/03/12 2147 02/03/12 2130  WBC 10.4 9.5 9.5 -- 12.8*  HGB 8.8* 9.2* 10.1* 11.6* 11.1*  HCT 26.2* 27.6* 29.2* 34.0* 32.4*  MCV 83.2 83.9 81.8 -- 81.6  PLT 232 221 256 -- 266   CBG:  Lab 02/09/12 0744 02/08/12 1358 02/08/12 1330 02/08/12 0949 02/08/12 0924  GLUCAP 126* 88 56* 89 60*    Studies: Dg Abd Portable 1v 02/09/2012   IMPRESSION: Interval improvement  in partial SBO pattern.   Original Report Authenticated By: Dwyane Dee, M.D.     Scheduled Meds:   . heparin  5,000 Units Subcutaneous Q8H  . pantoprazole   40 mg Intravenous QHS  . sertraline  25 mg Oral Daily

## 2012-02-09 NOTE — Progress Notes (Signed)
  02/09/2012, 8:52 AM  Hospital day: 7 Antibiotics: none Chemotherapy: none presently  Patient seen with nursing students in room, no family here now.  Subjective: Feeling better this AM.. No nausea or vomiting with NG clamped since yesterday. Good BM last night!. Able to sleep. Denies shortness of breath or pain.  Objective: Vital signs in last 24 hours: Blood pressure 151/61, pulse 81, temperature 98.9 F (37.2 C), temperature source Oral, resp. rate 18, height 5\' 4"  (1.626 m), weight 182 lb 8.7 oz (82.8 kg), SpO2 97.00%.   Intake/Output from previous day: 11/18 0701 - 11/19 0700 In: 1607 [I.V.:1607] Out: 900 [Urine:900] Intake/Output this shift:    Physical exam: awake, alert, looks comfortable supine on RA with NG clamped. PAC infusing at 100/hr without difficulty. Lungs clear. Heart RRR. Abdomen somewhat distended, not tight, may be progressive ascites, not tender, quiet. LE no edema, cords, tenderness.  Lab Results:  Basename 02/09/12 0500 02/07/12 0605  WBC 10.4 9.5  HGB 8.8* 9.2*  HCT 26.2* 27.6*  PLT 232 221   BMET  Basename 02/09/12 0500 02/07/12 0605  NA 137 137  K 3.1* 3.6  CL 105 105  CO2 23 21  GLUCOSE 133* 76  BUN 7 12  CREATININE 0.73 0.85  CALCIUM 8.8 8.9    Studies/Results: Dg Abd Portable 1v  02/09/2012  *RADIOLOGY REPORT*  Clinical Data: Small bowel obstruction, follow-up  PORTABLE ABDOMEN - 1 VIEW  Comparison: Abdomen films of 02/05/2012  Findings: There has been some improvement in gaseous distention of small bowel.  A small bowel loop now measures 3.6 cm in diameter, and there is colonic bowel gas present.  IMPRESSION: Interval improvement in partial SBO pattern.   Original Report Authenticated By: Dwyane Dee, M.D.    [place] for possible therapeutic and diagnostic paracentesis pending today  Assessment/Plan: 1. SBO: improving, tolerating NG clamped, bowels moved well last pm, Xray as above. Hopefully NG out soon. Hopefully can tolerate pos  soon as has not been on TNA. 2.progressive ovarian carcinoma: expect we will resume systemic chemotherapy as outpatient after bowel obstruction resolves. I will stop tamoxifen now. 3.PAC in  4. HTN, IBS, hx borderline DM  5.multifactorial anemia: note patient is not Jehovah's Witness however daughters are; patient has been comfortable with PRBCs previously tho this concerns daughters.    Jaclyn [name],Jaclyn [name] [phone]

## 2012-02-09 NOTE — Progress Notes (Signed)
Physical Therapy Treatment Patient Details Name: Jaclyn [name] MRN: [id number] DOB: [birth year] Today's Date: 02/09/2012 Time: 8469-6295 PT Time Calculation (min): 23 min  PT Assessment / Plan / Recommendation Comments on Treatment Session  Progressing well with mobility. Pt states she feels safer with RW.     Follow Up Recommendations  No PT follow up     Does the patient have the potential to tolerate intense rehabilitation     Barriers to Discharge        Equipment Recommendations  Rolling walker with 5" wheels    Recommendations for Other Services    Frequency Min 3X/week   Plan Discharge plan remains appropriate    Precautions / Restrictions Restrictions Weight Bearing Restrictions: No   Pertinent Vitals/Pain Pt denies    Mobility  Bed Mobility Bed Mobility: Supine to Sit;Sit to Supine Supine to Sit: 6: Modified independent (Device/Increase time) Sit to Supine: 6: Modified independent (Device/Increase time) Transfers Transfers: Sit to Stand;Stand to Sit Sit to Stand: 5: Supervision;From bed;From toilet Stand to Sit: To bed;To toilet;5: Supervision Details for Transfer Assistance: VCs safety Ambulation/Gait Ambulation/Gait Assistance: 6: Modified independent (Device/Increase time) Ambulation Distance (Feet): 700 Feet+ Assistive device: Rolling walker Ambulation/Gait Assistance Details: Good gait speed. Ambulated ~30 feet pushing IV pole in room.  Gait Pattern: Step-through pattern    Exercises     PT Diagnosis:    PT Problem List:   PT Treatment Interventions:     PT Goals Acute Rehab PT Goals Pt will Ambulate: >150 feet;with least restrictive assistive device;Independently PT Goal: Ambulate - Progress: Progressing toward goal  Visit Information  Last PT Received On: 02/09/12 Assistance Needed: +1    Subjective Data  Subjective: "I walked twice yesterday" Patient Stated Goal: Home   Cognition  Overall Cognitive Status: Appears within  functional limits for tasks assessed/performed Arousal/Alertness: Awake/[name] Orientation Level: Appears intact for tasks assessed Behavior During Session: Jaclyn [name] for tasks performed    Balance     End of Session PT - End of Session Activity Tolerance: Patient tolerated treatment well Patient left: in bed;with call bell/phone within reach   GP     Jaclyn [name] [facility] 02/09/2012, 12:21 PM [phone]

## 2012-02-09 NOTE — Progress Notes (Signed)
Agree with above.  Wilmon Arms. Corliss Skains, MD, [facility] Surgery  02/09/2012 1:01 PM

## 2012-02-09 NOTE — Progress Notes (Signed)
Patient ID: Jaclyn [name], female   DOB: February 13, 1941, 71 y.o.   MRN: [id number] Subjective: Pt reports feeling fine today, denies n/v or abd pain. +bm and flatus, did well with NGT clamped   Objective: Vital signs in last 24 hours: Temp:  [98.9 F (37.2 C)-99.6 F (37.6 C)] 98.9 F (37.2 C) (11/19 0458) Pulse Rate:  [79-82] 81  (11/19 0458) Resp:  [16-18] 18  (11/19 0458) BP: (143-159)/(61-77) 151/61 mmHg (11/19 0458) SpO2:  [97 %-100 %] 97 % (11/19 0458) Last BM Date: 02/03/12  Intake/Output from previous day: 11/18 0701 - 11/19 0700 In: 1607 [I.V.:1607] Out: 900 [Urine:900] Intake/Output this shift:    General appearance: alert, cooperative and no distress GI: soft, non tender, softer today, no peritoneal signs. NG clamped.  Lab Results:   Basename 02/09/12 0500 02/07/12 0605  WBC 10.4 9.5  HGB 8.8* 9.2*  HCT 26.2* 27.6*  PLT 232 221   BMET  Basename 02/09/12 0500 02/07/12 0605  NA 137 137  K 3.1* 3.6  CL 105 105  CO2 23 21  GLUCOSE 133* 76  BUN 7 12  CREATININE 0.73 0.85  CALCIUM 8.8 8.9   PT/INR No results found for this basename: LABPROT:2,INR:2 in the last 72 hours ABG No results found for this basename: PHART:2,PCO2:2,PO2:2,HCO3:2 in the last 72 hours  Studies/Results: Dg Abd Portable 1v  02/09/2012  *RADIOLOGY REPORT*  Clinical Data: Small bowel obstruction, follow-up  PORTABLE ABDOMEN - 1 VIEW  Comparison: Abdomen films of 02/05/2012  Findings: There has been some improvement in gaseous distention of small bowel.  A small bowel loop now measures 3.6 cm in diameter, and there is colonic bowel gas present.  IMPRESSION: Interval improvement in partial SBO pattern.   Original Report Authenticated By: Dwyane Dee, M.D.     Anti-infectives: Anti-infectives    None      Assessment/Plan: 1. SBO: as per Dr. Delice Lesch note-patient reluctant to consider surgery and wants to manage conservatively, she does not have peritoneal symptoms or leukocytosis,  bowel function returning  --d/c ngt  --allow sips of clears but no tray  --if n/v develops then make NPO again.  --still may need surgical intervention    LOS: 6 days    Jaclyn [name] 02/09/2012

## 2012-02-10 ENCOUNTER — Other Ambulatory Visit: Payer: Self-pay | Admitting: Oncology

## 2012-02-10 ENCOUNTER — Inpatient Hospital Stay (HOSPITAL_COMMUNITY): Payer: Medicare Other

## 2012-02-10 DIAGNOSIS — E876 Hypokalemia: Secondary | ICD-10-CM | POA: Diagnosis present

## 2012-02-10 DIAGNOSIS — R509 Fever, unspecified: Secondary | ICD-10-CM

## 2012-02-10 DIAGNOSIS — F329 Major depressive disorder, single episode, unspecified: Secondary | ICD-10-CM | POA: Diagnosis present

## 2012-02-10 DIAGNOSIS — E785 Hyperlipidemia, unspecified: Secondary | ICD-10-CM | POA: Diagnosis present

## 2012-02-10 LAB — URINE MICROSCOPIC-ADD ON

## 2012-02-10 LAB — URINALYSIS, ROUTINE W REFLEX MICROSCOPIC
Bilirubin Urine: NEGATIVE
Glucose, UA: NEGATIVE mg/dL
Hgb urine dipstick: NEGATIVE
Ketones, ur: NEGATIVE mg/dL
Nitrite: NEGATIVE
Protein, ur: NEGATIVE mg/dL
Specific Gravity, Urine: 1.011 (ref 1.005–1.030)
Urobilinogen, UA: 0.2 mg/dL (ref 0.0–1.0)
pH: 6 (ref 5.0–8.0)

## 2012-02-10 LAB — GLUCOSE, CAPILLARY: Glucose-Capillary: 128 mg/dL — ABNORMAL HIGH (ref 70–99)

## 2012-02-10 MED ORDER — POTASSIUM CHLORIDE IN NACL 40-0.9 MEQ/L-% IV SOLN
INTRAVENOUS | Status: DC
  Administered 2012-02-10 – 2012-02-11 (×2): via INTRAVENOUS
  Filled 2012-02-10 (×3): qty 1000

## 2012-02-10 NOTE — Progress Notes (Signed)
Patient is a member of [employer] and the pastor has been to visit. Will continue to support as needed. Patient requested prayer; prayed.  02/10/12 1000  Clinical Encounter Type  Visited With Patient  Visit Type Spiritual support  Referral From Palliative care team  Recommendations Follow up

## 2012-02-10 NOTE — Progress Notes (Signed)
PATIENT HAS TEMP OF 101.2, RECHECKED AFTER WALKING 100.8, CALLED FLOOR COVERAGE MD, ORDERS RECEIVED, Khaleb Broz, RN

## 2012-02-10 NOTE — Progress Notes (Signed)
CARE MANAGE MENT UTILIZATION REVIEW NOTE 02/10/2012     Patient:  Jaclyn [name], Jaclyn [name]   Account Number:  [id number]  Documented by:  Anola Gurney   Per Ur Regulation Reviewed for med. necessity/level of care/duration of stay

## 2012-02-10 NOTE — Progress Notes (Signed)
PT Cancellation Note  Patient Details Name: Jaclyn [name] MRN: [id number] DOB: February 20, 1941   Cancelled Treatment:    Reason Eval/Treat Not Completed: Other (comment) (pt just walked with nursing)   Donnetta Hail 02/10/2012, 1:29 PM

## 2012-02-10 NOTE — Progress Notes (Signed)
[facility] PROGRESS NOTE  Jaclyn [name] [id number]:[id number] DOB: 10-29-1940 DOA: 02/03/2012 PCP: Laurette Schimke, MD PHD  Brief narrative: Jaclyn [name] is a 71 year old woman with a past medical history of ovarian cancer and peritoneal carcinomatosis who presented to the hospital on 02/04/2012 with nausea, vomiting, and abdominal pain. She was found to have a small bowel obstruction and admitted for further care. She has responded to conservative therapy with bowel rest and nasogastric decompression. The NG tube was removed on 02/09/2012 and her diet was advanced to clear liquids which she has tolerated well.  She did spike a fever overnight on 02/09/12, but has no symptoms suggestive of infection.  Assessment/Plan: Principal Problem:  *Partial small bowel obstruction   Admitted and placed on bowel rest with nasogastric decompression. NG tube removed 02/09/2012.  Diet advanced to clear liquid on 02/09/2012, tolerated well, advance to [facility] today.  Surgery following, but not likely to need any surgical intervention.   Abdominal x-ray done 02/09/2012 with interval improvement in small bowel obstruction. Active Problems: Fever  Patient spiked a temperature of 101.2 on 02/09/2012. Blood cultures sent. Urinalysis sent.  No symptoms of active infection.  No leukocytosis. Normocytic anemia  Likely the sequela of prior chemotherapy and anemia of chronic disease. Hemoglobin stable.  Ovarian cancer and peritoneal carcinomatosis   Being followed by Dr. Darrold Span of oncology.  Continue tamoxifen. Dyslipidemia   Simvastatin on hold due to dysphagia. Depression   Continue sertraline 25 mg PO Daily. Hypokalemia   Secondary to GI losses.  Monitor and replace as needed  Code Status: Full. Family Communication: None at bedside. Disposition Plan: Home in next 24-48 hours.   Medical Consultants:  Dr. Jama Flavors, Oncology  Dr. Manus Rudd, Surgery  Other  Consultants:  Physical therapy: No PT follow up.  Anti-infectives:  None.  HPI/Subjective: Jaclyn [name] is tolerating her clear liquids without any return of N/V or abdominal pain.  She is passing flatus.  No dysuria or cough.  No SOB.  Objective: Filed Vitals:   02/09/12 2115 02/09/12 2130 02/10/12 0150 02/10/12 0505  BP: 148/68   124/62  Pulse: 86   83  Temp: 101.2 F (38.4 C) 100.8 F (38.2 C) 99.8 F (37.7 C) 99.2 F (37.3 C)  TempSrc: Oral   Oral  Resp: 18   18  Height:      Weight:      SpO2: 97%   96%    Intake/Output Summary (Last 24 hours) at 02/10/12 0737 Last data filed at 02/10/12 0520  Gross per 24 hour  Intake   1440 ml  Output    800 ml  Net    640 ml    Exam: Gen:  NAD Cardiovascular:  RRR, No M/R/G Respiratory:  Lungs CTAB Gastrointestinal:  Abdomen soft, NT/ND, + BS Extremities:  Trace edema  Data Reviewed: Basic Metabolic Panel:  Lab 02/09/12 3664 02/07/12 0605 02/04/12 0500 02/03/12 2147  NA 137 137 132* 136  K 3.1* 3.6 -- --  CL 105 105 100 105  CO2 23 21 19  --  GLUCOSE 133* 76 104* 122*  BUN 7 12 24* 28*  CREATININE 0.73 0.85 1.00 1.30*  CALCIUM 8.8 8.9 9.5 --  MG -- -- -- --  PHOS -- -- -- --   GFR Estimated Creatinine Clearance: 65.2 ml/min (by C-G formula based on Cr of 0.73).  CBC:  Lab 02/09/12 0500 02/07/12 0605 02/04/12 0500 02/03/12 2147 02/03/12 2130  WBC 10.4 9.5 9.5 -- 12.8*  NEUTROABS -- -- -- --  11.0*  HGB 8.8* 9.2* 10.1* 11.6* 11.1*  HCT 26.2* 27.6* 29.2* 34.0* 32.4*  MCV 83.2 83.9 81.8 -- 81.6  PLT 232 221 256 -- 266   CBG:  Lab 02/09/12 0744 02/08/12 1358 02/08/12 1330 02/08/12 0949 02/08/12 0924  GLUCAP 126* 88 56* 89 60*     Procedures and Diagnostic Studies:  Ct Abdomen Pelvis W Contrast 02/04/2012 IMPRESSION:  1.  Examination is positive for partial small bowel obstruction with right lower quadrant transition point. 2.  Ascites and peritoneal nodule consistent with peritoneal carcinomatosis.   When compared with previous exam there is a new nodule within the left upper quadrant of the abdomen. Additionally, the volume of ascites has increased in the interval. In the setting of bowel obstruction the ascites is somewhat nonspecific.  3.  Fatty infiltration of the liver.   Original Report Authenticated By: Signa Kell, M.D.     US Abdomen Limited 02/09/2012 IMPRESSION: No sonographic evidence of ascites.   Original Report Authenticated By: Amie Portland, M.D.     Dg Abd Acute W/chest 02/03/2012 IMPRESSION: Findings concerning for partial small bowel obstruction.   Original Report Authenticated By: Charlett Nose, M.D.     Dg Abd Portable 1v 02/09/2012  IMPRESSION: Interval improvement in partial SBO pattern.   Original Report Authenticated By: Dwyane Dee, M.D.     Dg Abd Portable 1v 02/05/2012  IMPRESSION: Mild gaseous distention of both large and small bowel.   Original Report Authenticated By: Charlett Nose, M.D.     Scheduled Meds:    . antiseptic oral rinse  15 mL Mouth Rinse q12n4p  . chlorhexidine  15 mL Mouth Rinse BID  . heparin  5,000 Units Subcutaneous Q8H  . pantoprazole (PROTONIX) IV  40 mg Intravenous QHS  . [COMPLETED] potassium chloride  10 mEq Intravenous Q1 Hr x 3  . sertraline  25 mg Oral Daily  . [DISCONTINUED] tamoxifen  10 mg Oral BID   Continuous Infusions:    . dextrose 5 % and 0.9% NaCl 75 mL/hr at 02/09/12 1955    Time spent: 25 minutes.   LOS: 7 days   Sloane Palmer  [facility] Pager [phone].  If 8PM-8AM, please contact night-coverage at [email or web address], password [facility] 02/10/2012, 7:37 AM

## 2012-02-10 NOTE — Progress Notes (Signed)
  02/10/2012, 12:33 PM  Hospital day: 8 Antibiotics: none Chemotherapy: none presently    Subjective: Up in chair, has tolerated most of full liquids for lunch. No pain and no localizing symptoms of infection with temp overnight, no chills. No problems with PAC. Respirations not labored RA. Bowels have moved and is passing flatus. Still feels abdomen is full, but no ascites on [place] yesterday and may be related to bowels.  Objective: Vital signs in last 24 hours: Blood pressure 138/66, pulse 72, temperature 99.3 F (37.4 C), temperature source Oral, resp. rate 18, height 5\' 4"  (1.626 m), weight 182 lb 8.7 oz (82.8 kg), SpO2 100.00%.   Intake/Output from previous day: 11/19 0701 - 11/20 0700 In: 1440 [P.O.:1440] Out: 800 [Urine:800] Intake/Output this shift: Total I/O In: 480 [P.O.:480] Out: -   Physical exam: Looks comfortable, alert and appropriate. Oral mucosa moist and clear. PERRL, not icteric. Lungs without wheezes or rales. PAC infusing without problems. Abdomen soft, some BS, nontender. LE no edema, cords, tenderness.  Lab Results:  Basename 02/09/12 0500  WBC 10.4  HGB 8.8*  HCT 26.2*  PLT 232   BMET  Basename 02/09/12 0500  NA 137  K 3.1*  CL 105  CO2 23  GLUCOSE 133*  BUN 7  CREATININE 0.73  CALCIUM 8.8   UA 11-20 0500 7-10 WBC, few bact, 0-2 RBC and culture pending Blood cultures sent and pending Studies/Results: Dg Chest 2 View  02/10/2012  *RADIOLOGY REPORT*  Clinical Data:  Fevers  CHEST - 2 VIEW  Comparison: 02/03/2012  Findings: The cardiac shadow is stable.  The right-sided chest port is again seen.  Minimal pleural effusions are noted bilaterally but slightly greater on the left.  No focal confluent infiltrate is seen.  No acute bony abnormality is noted.  IMPRESSION: Tiny bilateral pleural effusions.   Original Report Authenticated By: Alcide Clever, M.D.    US Abdomen Limited  02/09/2012  *RADIOLOGY REPORT*  Clinical Data: The patient for  possible therapeutic paracentesis if there it is sufficient ascites.  This is to evaluate for amount of ascites.  LIMITED ABDOMINAL ULTRASOUND  Comparison:  None.  Findings: No ascites is appreciated in the peritoneal cavity.  IMPRESSION: No sonographic evidence of ascites.   Original Report Authenticated By: Amie Portland, M.D.    Dg Abd Portable 1v  02/09/2012  *RADIOLOGY REPORT*  Clinical Data: Small bowel obstruction, follow-up  PORTABLE ABDOMEN - 1 VIEW  Comparison: Abdomen films of 02/05/2012  Findings: There has been some improvement in gaseous distention of small bowel.  A small bowel loop now measures 3.6 cm in diameter, and there is colonic bowel gas present.  IMPRESSION: Interval improvement in partial SBO pattern.   Original Report Authenticated By: Dwyane Dee, M.D.      Assessment/Plan: 1.SBO improved: NG out, advancing diet. 2.fever overnight: possibly urine, otherwise nothing obvious. 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 I will see her back at [facility] on Dec 4, or sooner if needed. We will discuss resuming chemotherapy as outpatient. Gyn oncology is aware of situation also. Will stop tamoxifen now. 4.PAC in  LIVESAY,LENNIS P [phone]

## 2012-02-10 NOTE — Progress Notes (Signed)
Advance diet as tolerated. Home soon.  Wilmon Arms. Corliss Skains, MD, [facility] Surgery  02/10/2012 2:38 PM

## 2012-02-10 NOTE — Progress Notes (Signed)
Patient ID: Jaclyn [name], female   DOB: [birth year], 71 y.o.   MRN: [id number] Subjective: Doing well, tolerating clear liquids well, denies n/v or abd pain. +bm and flatus,   Objective: Vital signs in last 24 hours: Temp:  [98.2 F (36.8 C)-101.2 F (38.4 C)] 99.2 F (37.3 C) (11/20 0505) Pulse Rate:  [80-86] 83  (11/20 0505) Resp:  [18] 18  (11/20 0505) BP: (124-148)/(60-68) 124/62 mmHg (11/20 0505) SpO2:  [96 %-97 %] 96 % (11/20 0505) Last BM Date: 02/09/12  Intake/Output from previous day: 11/19 0701 - 11/20 0700 In: 1440 [P.O.:1440] Out: 800 [Urine:800] Intake/Output this shift:    General appearance: alert, cooperative and no distress GI: soft, nontender, +bs  Lab Results:   Basename 02/09/12 0500  WBC 10.4  HGB 8.8*  HCT 26.2*  PLT 232   BMET  Basename 02/09/12 0500  NA 137  K 3.1*  CL 105  CO2 23  GLUCOSE 133*  BUN 7  CREATININE 0.73  CALCIUM 8.8   PT/INR No results found for this basename: LABPROT:2,INR:2 in the last 72 hours ABG No results found for this basename: PHART:2,PCO2:2,PO2:2,HCO3:2 in the last 72 hours  Studies/Results: US Abdomen Limited  02/09/2012  *RADIOLOGY REPORT*  Clinical Data: The patient for possible therapeutic paracentesis if there it is sufficient ascites.  This is to evaluate for amount of ascites.  LIMITED ABDOMINAL ULTRASOUND  Comparison:  None.  Findings: No ascites is appreciated in the peritoneal cavity.  IMPRESSION: No sonographic evidence of ascites.   Original Report Authenticated By: Amie Portland, M.D.    Dg Abd Portable 1v  02/09/2012  *RADIOLOGY REPORT*  Clinical Data: Small bowel obstruction, follow-up  PORTABLE ABDOMEN - 1 VIEW  Comparison: Abdomen films of 02/05/2012  Findings: There has been some improvement in gaseous distention of small bowel.  A small bowel loop now measures 3.6 cm in diameter, and there is colonic bowel gas present.  IMPRESSION: Interval improvement in partial SBO pattern.   Original  Report Authenticated By: Dwyane Dee, M.D.     Anti-infectives: Anti-infectives    None      Assessment/Plan: 1. SBO: patient actually improving with conservative treatment, still need to move slow  --advance to full liquids this afternoon  --if tolerates this may advance diet tomorrow  --depending on tolerance of diet could d/c in next 24-48 hours.     LOS: 7 days    WHITE, ELIZABETH 02/10/2012

## 2012-02-11 LAB — BASIC METABOLIC PANEL
BUN: 4 mg/dL — ABNORMAL LOW (ref 6–23)
Chloride: 107 mEq/L (ref 96–112)
GFR calc Af Amer: >90 mL/min (ref >90)
GFR calc non Af Amer: 81 mL/min — ABNORMAL LOW (ref >90)
Potassium: 3.8 mEq/L (ref 3.5–5.1)
Sodium: 139 mEq/L (ref 135–145)

## 2012-02-11 LAB — CBC
HCT: 25.2 % — ABNORMAL LOW (ref 36.0–46.0)
Hemoglobin: 8.5 g/dL — ABNORMAL LOW (ref 12.0–15.0)
MCHC: 33.7 g/dL (ref 30.0–36.0)
RBC: 3.02 MIL/uL — ABNORMAL LOW (ref 3.87–5.11)

## 2012-02-11 LAB — GLUCOSE, CAPILLARY: Glucose-Capillary: 104 mg/dL — ABNORMAL HIGH (ref 70–99)

## 2012-02-11 MED ORDER — HEPARIN SOD (PORK) LOCK FLUSH 100 UNIT/ML IV SOLN
INTRAVENOUS | Status: AC
  Filled 2012-02-11: qty 5

## 2012-02-11 NOTE — Progress Notes (Signed)
Wilmon Arms. Corliss Skains, MD, [facility] Surgery  02/11/2012 12:47 PM

## 2012-02-11 NOTE — Discharge Summary (Signed)
Physician Discharge Summary  Jaclyn [name] ZOX:[id number] DOB: 02-Oct-1940 DOA: 02/03/2012  PCP: Jaclyn Schimke, MD PHD  Admit date: 02/03/2012 Discharge date: 02/11/2012  Recommendations for Outpatient Follow-up:  1. Has follow up scheduled with Jaclyn [name] on 02/24/12. 2. F/U final blood culture results (drawn on 02/09/12).  Discharge Diagnoses:   Principal Problem:  *Partial small bowel obstruction  Active Problems:   Ovarian cancer   Peritoneal carcinomatosis   Anemia of chronic disease   Dyslipidemia   Depression   Hypokalemia   Fever   Discharge Condition: Improved.  Diet recommendation: Low sodium, heart healthy.  History of present illness:  Jaclyn [name] is a 71 year old woman with a past medical history of ovarian cancer and peritoneal carcinomatosis who presented to the hospital on 02/04/2012 with nausea, vomiting, and abdominal pain. She was found to have a small bowel obstruction and admitted for further care.   Hospital Course by problem:  Principal Problem:  *Partial small bowel obstruction  Admitted and placed on bowel rest with nasogastric decompression. Abdominal x-ray done 02/09/2012 with interval improvement in small bowel obstruction. NG tube subsequently removed 02/09/2012.  Diet advanced to clear liquid on 02/09/2012, full liquids tolerated at lunch 02/10/2012 so had a bland dinner 02/10/2012, which she tolerated well. Ate about 1/2 her breakfast today.  Bowels moved yesterday and is passing flatus. Surgery followed, but no need for any surgical intervention.  Active Problems:  Fever  Patient spiked a temperature of 101.2 on 02/09/2012. Blood cultures sent. Urinalysis sent. No symptoms of active infection. No leukocytosis. Normocytic anemia  Likely the sequela of prior chemotherapy and anemia of chronic disease. Hemoglobin stable.  Ovarian cancer and peritoneal carcinomatosis  Being followed by Jaclyn [name] of oncology.  Has follow up  scheduled for 02/24/12. Dyslipidemia  Simvastatin resumed at discharge. Depression  Continue sertraline 25 mg PO Daily. Hypokalemia  Secondary to GI losses.  Resolved.  Medical Consultants:  Jaclyn [name], Oncology  Jaclyn [name], Surgery Other Consultants:  Physical therapy: No PT follow up.  Rolling walker ordered for discharge.  Discharge Exam: Filed Vitals:   02/11/12 0621  BP: 136/59  Pulse: 77  Temp: 98.6 F (37 C)  Resp: 18   Filed Vitals:   02/10/12 1000 02/10/12 1351 02/10/12 2156 02/11/12 0621  BP: 138/66 136/61 140/58 136/59  Pulse: 72 81 77 77  Temp: 99.3 F (37.4 C) 99.4 F (37.4 C) 100.2 F (37.9 C) 98.6 F (37 C)  TempSrc: Oral Oral Oral Oral  Resp: 18 18 18 18   Height:      Weight:      SpO2: 100% 100% 100% 97%    Gen: NAD  Cardiovascular: RRR, No M/R/G  Respiratory: Lungs CTAB  Gastrointestinal: Abdomen soft, NT/ND, + BS  Extremities: Trace edema  Discharge Instructions  Discharge Orders    Future Appointments: Provider: Department: Dept Phone: Center:   02/24/2012 11:15 AM Jaclyn [name] [facility] MEDICAL ONCOLOGY [phone] None   02/24/2012 11:30 AM Jaclyn [name] [facility] MEDICAL ONCOLOGY [phone] None   02/24/2012 12:30 PM Jaclyn Buzzy Han, MD [facility] MEDICAL ONCOLOGY [phone] None     Future Orders Please Complete By Expires   For home use only DME Walker rolling      Diet - low sodium heart healthy      Increase activity slowly      Call MD for:  persistant nausea and vomiting      Call MD  for:  severe uncontrolled pain          Medication List     As of 02/11/2012  8:52 AM    STOP taking these medications         tamoxifen 10 MG tablet   Commonly known as: NOLVADEX      TAKE these medications         ARIPiprazole 15 MG tablet   Commonly known as: ABILIFY   Take 30 mg by mouth daily.      aspirin 81 MG tablet   Take 81 mg by mouth daily.        ferrous gluconate 246 (28 FE) MG tablet   Commonly known as: FERGON   Take 246 mg by mouth daily with breakfast.      lidocaine-prilocaine cream   Commonly known as: EMLA   Apply 1 application topically as needed. PRIOR TO PORTA-CATH ACCESS      losartan-hydrochlorothiazide 100-12.5 MG per tablet   Commonly known as: HYZAAR   Take 1 tablet by mouth daily.      omeprazole 20 MG capsule   Commonly known as: PRILOSEC   Take 20 mg by mouth 2 (two) times daily.      potassium chloride SA 20 MEQ tablet   Commonly known as: K-DUR,KLOR-CON   Take 20 mEq by mouth 2 (two) times daily.      sertraline 25 MG tablet   Commonly known as: ZOLOFT   Take 25 mg by mouth daily.      simvastatin 40 MG tablet   Commonly known as: ZOCOR   Take 40 mg by mouth at bedtime.           Follow-up Information    Follow up with Jaclyn Schimke, MD PHD. Schedule an appointment as soon as possible for a visit in 1 week.   Contact information:   [address] [place] [address] [phone]       Follow up with Jaclyn Packer, MD. (At your appt times noted below)    Contact information:   [address] [place] [address] [phone]           The results of significant diagnostics from this hospitalization (including imaging, microbiology, ancillary and laboratory) are listed below for reference.    Significant Diagnostic Studies: Ct Abdomen Pelvis W Contrast 02/04/2012 IMPRESSION: 1. Examination is positive for partial small bowel obstruction with right lower quadrant transition point. 2. Ascites and peritoneal nodule consistent with peritoneal carcinomatosis. When compared with previous exam there is a new nodule within the left upper quadrant of the abdomen. Additionally, the volume of ascites has increased in the interval. In the setting of bowel obstruction the ascites is somewhat nonspecific. 3. Fatty infiltration of the liver. Original Report Authenticated By: Jaclyn [name], M.D.  US Abdomen Limited 02/09/2012 IMPRESSION: No sonographic evidence of ascites. Original Report Authenticated By: Jaclyn [name], M.D.  Dg Abd Acute W/chest 02/03/2012 IMPRESSION: Findings concerning for partial small bowel obstruction. Original Report Authenticated By: Jaclyn [name], M.D.  Dg Abd Portable 1v 02/09/2012 IMPRESSION: Interval improvement in partial SBO pattern. Original Report Authenticated By: Dwyane Dee, M.D.  Dg Abd Portable 1v 02/05/2012 IMPRESSION: Mild gaseous distention of both large and small bowel. Original Report Authenticated By: Jaclyn [name], M.D.   Microbiology: Recent Results (from the past 240 hour(s))  CULTURE, BLOOD (ROUTINE X 2)     Status: Normal (Preliminary result)   Collection Time   02/09/12 11:00 PM  Component Value Range Status Comment   Specimen Description BLOOD LEFT ARM   Final    Special Requests BOTTLES DRAWN AEROBIC AND ANAEROBIC   Final    Culture  Setup Time 02/10/2012 03:47   Final    Culture     Final    Value:        BLOOD CULTURE RECEIVED NO GROWTH TO DATE CULTURE WILL BE HELD FOR 5 DAYS BEFORE ISSUING A FINAL NEGATIVE REPORT   Report Status PENDING   Incomplete   CULTURE, BLOOD (ROUTINE X 2)     Status: Normal (Preliminary result)   Collection Time   02/09/12 11:05 PM      Component Value Range Status Comment   Specimen Description BLOOD RIGHT HAND   Final    Special Requests BOTTLES DRAWN AEROBIC AND ANAEROBIC 1.5ML   Final    Culture  Setup Time 02/10/2012 03:48   Final    Culture     Final    Value:        BLOOD CULTURE RECEIVED NO GROWTH TO DATE CULTURE WILL BE HELD FOR 5 DAYS BEFORE ISSUING A FINAL NEGATIVE REPORT   Report Status PENDING   Incomplete      Labs: Basic Metabolic Panel:  Lab 02/11/12 4010 02/09/12 0500 02/07/12 0605  NA 139 137 137  K 3.8 3.1* 3.6  CL 107 105 105  CO2 25 23 21   GLUCOSE 97 133* 76  BUN 4* 7 12  CREATININE 0.77 0.73 0.85  CALCIUM 8.7 8.8 8.9  MG -- -- --  PHOS -- -- --    CBC:  Lab 02/11/12 0630 02/09/12 0500 02/07/12 0605  WBC 8.4 10.4 9.5  NEUTROABS -- -- --  HGB 8.5* 8.8* 9.2*  HCT 25.2* 26.2* 27.6*  MCV 83.4 83.2 83.9  PLT 243 232 221   CBG:  Lab 02/11/12 0752 02/10/12 0844 02/09/12 0744 02/08/12 1358 02/08/12 1330  GLUCAP 104* 128* 126* 88 56*    Time coordinating discharge: 35 minutes.  Signed:  RAMA,CHRISTINA  Pager [phone] [facility] 02/11/2012, 8:52 AM

## 2012-02-11 NOTE — Progress Notes (Signed)
Patient ID: Jaclyn [name], female   DOB: 04-07-1940, 71 y.o.   MRN: [id number] Subjective: Doing well, tolerating regular diet well, denies n/v or abd pain. +bm and flatus,   Objective: Vital signs in last 24 hours: Temp:  [98.6 F (37 C)-100.2 F (37.9 C)] 98.6 F (37 C) (11/21 0621) Pulse Rate:  [77-81] 77  (11/21 0621) Resp:  [18] 18  (11/21 0621) BP: (136-140)/(58-61) 136/59 mmHg (11/21 0621) SpO2:  [97 %-100 %] 97 % (11/21 0621) Last BM Date: 02/10/12  Intake/Output from previous day: 11/20 0701 - 11/21 0700 In: 720 [P.O.:720] Out: -  Intake/Output this shift: Total I/O In: 240 [P.O.:240] Out: 800 [Urine:800]  General appearance: alert, cooperative and no distress GI: soft, nontender, +bs  Lab Results:   Basename 02/11/12 0630 02/09/12 0500  WBC 8.4 10.4  HGB 8.5* 8.8*  HCT 25.2* 26.2*  PLT 243 232   BMET  Basename 02/11/12 0630 02/09/12 0500  NA 139 137  K 3.8 3.1*  CL 107 105  CO2 25 23  GLUCOSE 97 133*  BUN 4* 7  CREATININE 0.77 0.73  CALCIUM 8.7 8.8   PT/INR No results found for this basename: LABPROT:2,INR:2 in the last 72 hours ABG No results found for this basename: PHART:2,PCO2:2,PO2:2,HCO3:2 in the last 72 hours  Studies/Results: Dg Chest 2 View  02/10/2012  *RADIOLOGY REPORT*  Clinical Data:  Fevers  CHEST - 2 VIEW  Comparison: 02/03/2012  Findings: The cardiac shadow is stable.  The right-sided chest port is again seen.  Minimal pleural effusions are noted bilaterally but slightly greater on the left.  No focal confluent infiltrate is seen.  No acute bony abnormality is noted.  IMPRESSION: Tiny bilateral pleural effusions.   Original Report Authenticated By: Alcide Clever, M.D.     Anti-infectives: Anti-infectives    None      Assessment/Plan: 1. SBO: resolved now, tolerating regular diet, ok to d/c home from surgical standpoint.     LOS: 8 days    Jaclyn [name] 02/11/2012

## 2012-02-12 LAB — URINE CULTURE: Colony Count: >=100000

## 2012-02-16 LAB — CULTURE, BLOOD (ROUTINE X 2)
Culture: NO GROWTH
Culture: NO GROWTH

## 2012-02-21 ENCOUNTER — Other Ambulatory Visit (HOSPITAL_COMMUNITY): Payer: Self-pay | Admitting: Oncology

## 2012-02-24 ENCOUNTER — Telehealth: Payer: Self-pay | Admitting: Oncology

## 2012-02-24 ENCOUNTER — Ambulatory Visit (HOSPITAL_BASED_OUTPATIENT_CLINIC_OR_DEPARTMENT_OTHER): Payer: Medicare Other | Admitting: Oncology

## 2012-02-24 ENCOUNTER — Encounter: Payer: Self-pay | Admitting: Oncology

## 2012-02-24 ENCOUNTER — Other Ambulatory Visit (HOSPITAL_BASED_OUTPATIENT_CLINIC_OR_DEPARTMENT_OTHER): Payer: Medicare Other | Admitting: Lab

## 2012-02-24 ENCOUNTER — Ambulatory Visit: Payer: Medicare Other

## 2012-02-24 VITALS — BP 140/68 | HR 78 | Temp 97.9°F

## 2012-02-24 VITALS — BP 128/71 | HR 85 | Temp 96.7°F | Resp 18 | Ht 64.0 in | Wt 181.5 lb

## 2012-02-24 DIAGNOSIS — D649 Anemia, unspecified: Secondary | ICD-10-CM

## 2012-02-24 DIAGNOSIS — C569 Malignant neoplasm of unspecified ovary: Secondary | ICD-10-CM

## 2012-02-24 DIAGNOSIS — R197 Diarrhea, unspecified: Secondary | ICD-10-CM

## 2012-02-24 LAB — COMPREHENSIVE METABOLIC PANEL (CC13)
AST: 15 U/L (ref 5–34)
Albumin: 3.2 g/dL — ABNORMAL LOW (ref 3.5–5.0)
BUN: 16 mg/dL (ref 7.0–26.0)
CO2: 20 mEq/L — ABNORMAL LOW (ref 22–29)
Calcium: 9 mg/dL (ref 8.4–10.4)
Chloride: 107 mEq/L (ref 98–107)
Creatinine: 1 mg/dL (ref 0.6–1.1)
Glucose: 78 mg/dl (ref 70–99)
Potassium: 4.9 mEq/L (ref 3.5–5.1)

## 2012-02-24 LAB — CBC WITH DIFFERENTIAL/PLATELET
BASO%: 0.6 % (ref 0.0–2.0)
Basophils Absolute: 0 10*3/uL (ref 0.0–0.1)
Eosinophils Absolute: 0.4 10*3/uL (ref 0.0–0.5)
HCT: 29.3 % — ABNORMAL LOW (ref 34.8–46.6)
HGB: 9.7 g/dL — ABNORMAL LOW (ref 11.6–15.9)
LYMPH%: 32.7 % (ref 14.0–49.7)
MCHC: 33 g/dL (ref 31.5–36.0)
MONO#: 0.6 10*3/uL (ref 0.1–0.9)
NEUT#: 4.7 10*3/uL (ref 1.5–6.5)
NEUT%: 55.1 % (ref 38.4–76.8)
Platelets: 282 10*3/uL (ref 145–400)
WBC: 8.5 10*3/uL (ref 3.9–10.3)
lymph#: 2.8 10*3/uL (ref 0.9–3.3)

## 2012-02-24 MED ORDER — SODIUM CHLORIDE 0.9 % IJ SOLN
10.0000 mL | INTRAMUSCULAR | Status: DC | PRN
  Administered 2012-02-24: 10 mL via INTRAVENOUS
  Filled 2012-02-24: qty 10

## 2012-02-24 MED ORDER — HEPARIN SOD (PORK) LOCK FLUSH 100 UNIT/ML IV SOLN
500.0000 [IU] | Freq: Once | INTRAVENOUS | Status: AC
  Administered 2012-02-24: 500 [IU] via INTRAVENOUS
  Filled 2012-02-24: qty 5

## 2012-02-24 NOTE — Patient Instructions (Signed)
Call if problems prior to next visit.  Return stool specimen for C diff testing

## 2012-02-24 NOTE — Patient Instructions (Signed)
Patient to see Dr. Darrold Span today

## 2012-02-24 NOTE — Progress Notes (Signed)
OFFICE PROGRESS NOTE   02/24/2012   Physicians:W.Brewster, V.Bland, J.Mann, K.Reddy   INTERVAL HISTORY:  Patient is seen, alone for visit, now with progressive ovarian carcinoma complicated by partial SBO for which she was hospitalized from 11-13 thru 02-11-12. The SBO resolved with conservative interventions and she has not had recurrent symptoms since discharge home.  CT AP 02-04-12 showed small volume ascites and some peritoneal nodularity increased from scan of 06-2011. Repeat abdominal US on 02-09-12 did not demonstrate any ascites. She also had E coli UTI during that admission.  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 She had optimal debulking by Dr.Brewster, 10 liters of ascites at that procedure. She was treated with 8 cycles of taxol/carboplatin thru Aug. 2011, with CA125 still ~ 40 at completion of the chemotherapy. The tumor was ER + at 100% and she has continued tamoxifen as above. CA125 nadir was 20 in April 2012, with gradual rise since then tho patient remained asymptomatic until the recent SBO. She had been on Tamoxifen from Oct 2011 until this was stopped in hospital. Patient tells me now that she had had diarrhea prior to the admission and that she had taken imodium for that, which she feels may have contributed to the SBO.  Information from the hospitalization including the CT has been reviewed also by Dr Nelly Rout.  Patient has felt gradually better since discharge, now tolerating regular diet without any abdominal pain or vomiting. She reports diarrhea since discharge, last yesterday AM. She has had no vomiting, no fever, no shortness of breath, no bleeding.  Remainder of 10 point Review of Systems negative.  Objective:  Vital signs in last 24 hours:  BP 128/71  Pulse 85  Temp 96.7 F (35.9 C) (Oral)  Resp 18  Ht 5\' 4"  (1.626 m)  Wt 181 lb 8 oz (82.328 kg)  BMI 31.15  kg/m2 Ambulatory, respirations not labored RA, looks comfortable.   HEENT:PERRLA, sclera clear, anicteric and oropharynx clear, no lesions LymphaticsCervical, supraclavicular, and axillary nodes normal. Resp: clear to auscultation bilaterally and normal percussion bilaterally Cardio: regular rate and rhythm GI: obese, soft, nontender, normal bowel sounds, not distended, no clear HSM or mass Extremities: extremities normal, atraumatic, no cyanosis or edema Neuro:no sensory deficits noted Portacath-without erythema or tenderness  Lab Results:  Results for orders placed in visit on 02/24/12  CBC WITH DIFFERENTIAL      Component Value Range   WBC 8.5  3.9 - 10.3 10e3/uL   NEUT# 4.7  1.5 - 6.5 10e3/uL   HGB 9.7 (*) 11.6 - 15.9 g/dL   HCT 29.5 (*) 62.1 - 30.8 %   Platelets 282  145 - 400 10e3/uL   MCV 87.3  79.5 - 101.0 fL   MCH 28.8  25.1 - 34.0 pg   MCHC 33.0  31.5 - 36.0 g/dL   RBC 6.57 (*) 8.46 - 9.62 10e6/uL   RDW 15.3 (*) 11.2 - 14.5 %   lymph# 2.8  0.9 - 3.3 10e3/uL   MONO# 0.6  0.1 - 0.9 10e3/uL   Eosinophils Absolute 0.4  0.0 - 0.5 10e3/uL   Basophils Absolute 0.0  0.0 - 0.1 10e3/uL   NEUT% 55.1  38.4 - 76.8 %   LYMPH% 32.7  14.0 - 49.7 %   MONO% 7.0  0.0 - 14.0 %   EOS% 4.6  0.0 - 7.0 %   BASO% 0.6  0.0 - 2.0 %  COMPREHENSIVE METABOLIC PANEL (CC13)      Component Value Range   Sodium 139  136 - 145 mEq/L   Potassium 4.9  3.5 - 5.1 mEq/L   Chloride 107  98 - 107 mEq/L   CO2 20 (*) 22 - 29 mEq/L   Glucose 78  70 - 99 mg/dl   BUN 16.1  7.0 - 09.6 mg/dL   Creatinine 1.0  0.6 - 1.1 mg/dL   Total Bilirubin 0.45  0.20 - 1.20 mg/dL   Alkaline Phosphatase 45  40 - 150 U/L   AST 15  5 - 34 U/L   ALT 18  0 - 55 U/L   Total Protein 7.4  6.4 - 8.3 g/dL   Albumin 3.2 (*) 3.5 - 5.0 g/dL   Calcium 9.0  8.4 - 40.9 mg/dL    CA 811 available after visit 88, this having been 75 in October and 67 in May.  She is given instructions to return stool specimen for C diff  testing  Studies/Results: CT ABDOMEN AND PELVIS WITH CONTRAST 02-03-12  Technique: Multidetector CT imaging of the abdomen and pelvis was  performed following the standard protocol during bolus  administration of intravenous contrast.  Contrast: 80mL OMNIPAQUE IOHEXOL 300 MG/ML SOLN  Comparison: 06/26/2011  Findings:  Lung bases: No pericardial or pleural effusion. Lung bases appear  clear.  Abdomen/pelvis: There is diffuse fatty infiltration of the liver.  No focal liver parenchymal abnormality identified. Prior  cholecystectomy. The common bile duct is increased in caliber  measuring 10 mm.  The pancreas appears within normal limits. Normal appearance of  the spleen. Normal appearance of the adrenal glands.  Kidneys are unremarkable. The urinary bladder is within normal  limits. Previous hysterectomy.  No retroperitoneal or mesenteric adenopathy identified. There is  no pelvic or inguinal adenopathy.  The stomach is mildly distended. Proximal small bowel loops are  increased in caliber measuring up to 3.4 cm, image 38. Transition  to decreased caliber small bowel loops are identified at the level  of the mid ileum within the right lower quadrant, image 65. The  colon appears within normal limits.  A small amount of perihepatic ascites is noted. There is also a  small to moderate amount of ascites within the pelvis. Evidence of  peritoneal nodularity is noted. For example, left upper quadrant  of the abdomen peritoneal nodules seen on image 36 measuring 1.9  cm, image 36. This is new from previous exam. Scattered calcified  peritoneal nodules are also identified which likely represent areas  of treated tumor.  Bones/Musculoskeletal: Review of the visualized osseous structures  is unremarkable. No aggressive lytic or sclerotic bone lesions  IMPRESSION:  1. Examination is positive for partial small bowel obstruction  with right lower quadrant transition point.  2. Ascites and  peritoneal nodule consistent with peritoneal  carcinomatosis. When compared with previous exam there is a new  nodule within the left upper quadrant of the abdomen.  Additionally, the volume of ascites has increased in the interval.  In the setting of bowel obstruction the ascites is somewhat  nonspecific.  3. Fatty infiltration of the liver.  LIMITED ABDOMINAL ULTRASOUND 02-08-12 Comparison: None.  Findings: No ascites is appreciated in the peritoneal cavity.  IMPRESSION:  No sonographic evidence of ascites.  Medications: I have reviewed the patient's current medications. She will need antiemetics and premedication steroids sent to pharmacy; decadron will be 20 mg 12 hrs prior to the weekly dose taxol.  Per my discussion  with Dr Nelly Rout, I have recommended that patient resume chemotherapy. She is reluctant to do this, but understands that we expect she will have more problems if the situation is not improved. With previous chemotherapy, cytopenias have been the major issue. Doxil is presently on national shortage, tho otherwise Dr Nelly Rout would consider that. As patient  has not had taxol since Aug 2011, used then on q 3 week schedule, I have suggested we try weekly taxol day1/day 8 every 21 days, which she should tolerate probably better than previous dosing. She wants to wait until after the holidays to resume treatment, so that we have set this up to begin Dec 30; after cycle 1 she would rather have treatments on Tuesdays.  Assessment/Plan: 1.Further progression of recurrent ovarian carcinoma, recently complicated by self-limited SBO: Tamoxifen [facility] Nov 2013. Will resume chemo with low dose weekly taxol starting Dec 30. 2.PAC in 3.reported diarrhea: will check C diff and she will avoid imodium 4.multifactorial anemia:hemoglobin improved since last cbc in hospital.  5.HTN 6.history of anxiety/ depression for which she is followed by psychiatry 7.flu vaccine done  Patient was in  agreement with plan above   Tere Mcconaughey P, MD   02/24/2012, 8:21 PM

## 2012-02-24 NOTE — Telephone Encounter (Signed)
gv and pritned pt appt schedule for Dec.the patient could not do lab 12.14.13due to daughters schedule.Marland KitchenMarland Kitchen

## 2012-02-26 ENCOUNTER — Other Ambulatory Visit: Payer: Self-pay | Admitting: *Deleted

## 2012-02-26 ENCOUNTER — Telehealth: Payer: Self-pay | Admitting: *Deleted

## 2012-02-26 ENCOUNTER — Ambulatory Visit: Payer: Medicare Other | Admitting: Lab

## 2012-02-26 DIAGNOSIS — C569 Malignant neoplasm of unspecified ovary: Secondary | ICD-10-CM

## 2012-02-26 MED ORDER — DEXAMETHASONE 4 MG PO TABS: ORAL_TABLET | ORAL | Status: DC

## 2012-02-26 MED ORDER — ONDANSETRON HCL 8 MG PO TABS: ORAL_TABLET | ORAL | Status: AC

## 2012-02-26 NOTE — Telephone Encounter (Signed)
Called patient to see if she has collected the stool specimen (for c difficile) . Pt states that she has not had any further diarrhea, but will bring in specimen when she has diarrhea

## 2012-02-26 NOTE — Telephone Encounter (Signed)
Per staff message and POF I have scheduled appt.  JMW  

## 2012-02-26 NOTE — Telephone Encounter (Signed)
Message copied by Phillis Knack on Fri Feb 26, 2012 10:12 AM ------      Message from: Jama Flavors P      Created: Thu Feb 25, 2012  6:40 PM       Please send to her pharmacy ([facility] at [address])            Ondansetron 8 mg   1-2 every 12 hours as needed for nausea #30 with 2 RF            Decadron 4 mg  Five tablets = 20 mg 12 hours prior to taxol, each dose with food      #10 for 2 treatments  2 RF            Please call patient shortly prior to first chemo 03-21-12 to remind her when to take the decadron            thanks

## 2012-02-26 NOTE — Telephone Encounter (Signed)
Pt called to say she has brought over stool for c diff check. Has only had 1 bm today.

## 2012-02-26 NOTE — Telephone Encounter (Signed)
Called patient to let her know we have called in prescription for her to take before chemo. Decadron and Zofran. Will call her a few days prior to chemo to remind her when to take the decadron

## 2012-02-27 LAB — CLOSTRIDIUM DIFFICILE EIA

## 2012-03-01 ENCOUNTER — Telehealth: Payer: Self-pay

## 2012-03-01 NOTE — Telephone Encounter (Signed)
Message copied by Lorine Bears on Tue Mar 01, 2012  9:44 AM ------      Message from: Jama Flavors P      Created: Mon Feb 29, 2012  8:51 AM       Labs seen and need follow up: please let her know no infection

## 2012-03-01 NOTE — Telephone Encounter (Signed)
Spoke with Jaclyn [name] and told her c-diff results as noted below by Dr. Darrold Span.

## 2012-03-02 ENCOUNTER — Telehealth: Payer: Self-pay

## 2012-03-02 NOTE — Telephone Encounter (Signed)
Told Ms. Trigo that a home health nurse would be fine to have to check on her after her treatment if Ms. Helen Haze(owner of the day care Ms. Zulueta attends) wants to set this up for her. Told her that a hospice nurse would not be appropriate at this time as she is receiving chemotherapy.  A nurse from [facility] may be more appropriate. Ms. Dowson will discuss this with Ms. Danice Goltz.

## 2012-03-04 ENCOUNTER — Encounter (INDEPENDENT_AMBULATORY_CARE_PROVIDER_SITE_OTHER): Payer: Self-pay | Admitting: Surgery

## 2012-03-04 ENCOUNTER — Ambulatory Visit (INDEPENDENT_AMBULATORY_CARE_PROVIDER_SITE_OTHER): Payer: Medicare Other | Admitting: Surgery

## 2012-03-04 VITALS — BP 118/70 | HR 72 | Temp 97.9°F | Resp 16 | Ht 64.0 in | Wt 181.1 lb

## 2012-03-04 DIAGNOSIS — K56600 Partial intestinal obstruction, unspecified as to cause: Secondary | ICD-10-CM

## 2012-03-04 DIAGNOSIS — K56609 Unspecified intestinal obstruction, unspecified as to partial versus complete obstruction: Secondary | ICD-10-CM

## 2012-03-04 NOTE — Progress Notes (Signed)
The patient is here for a followup visit after recent hospitalization for a partial small bowel structure. 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 Fortunately, the patient's bowels began moving with no surgical intervention. She has been home for about 3 weeks and is doing quite well. Appetite and bowel movements are normal. She is scheduled to begin further chemotherapy on December 30. Her abdomen is soft and nontender with active bowel sounds.  I encouraged her to keep herself well-hydrated and to the smaller more frequent meals rather than 3 large meals a day. She may followup with [place] on a PRN basis.  Wilmon Arms. Corliss Skains, MD, [facility]  03/04/2012 10:54 AM

## 2012-03-08 ENCOUNTER — Other Ambulatory Visit (HOSPITAL_BASED_OUTPATIENT_CLINIC_OR_DEPARTMENT_OTHER): Payer: Medicare Other | Admitting: Lab

## 2012-03-08 DIAGNOSIS — C569 Malignant neoplasm of unspecified ovary: Secondary | ICD-10-CM

## 2012-03-08 LAB — CBC WITH DIFFERENTIAL/PLATELET
BASO%: 0.3 % (ref 0.0–2.0)
Basophils Absolute: 0 10*3/uL (ref 0.0–0.1)
EOS%: 3.8 % (ref 0.0–7.0)
HCT: 30.6 % — ABNORMAL LOW (ref 34.8–46.6)
LYMPH%: 34.1 % (ref 14.0–49.7)
MCH: 28.9 pg (ref 25.1–34.0)
MCHC: 32.9 g/dL (ref 31.5–36.0)
MCV: 87.7 fL (ref 79.5–101.0)
MONO%: 5.9 % (ref 0.0–14.0)
NEUT%: 55.9 % (ref 38.4–76.8)
lymph#: 3.2 10*3/uL (ref 0.9–3.3)

## 2012-03-08 LAB — COMPREHENSIVE METABOLIC PANEL (CC13)
ALT: 12 U/L (ref 0–55)
AST: 12 U/L (ref 5–34)
Alkaline Phosphatase: 54 U/L (ref 40–150)
BUN: 15 mg/dL (ref 7.0–26.0)
Chloride: 105 mEq/L (ref 98–107)
Creatinine: 1.1 mg/dL (ref 0.6–1.1)
Total Bilirubin: 0.27 mg/dL (ref 0.20–1.20)

## 2012-03-09 ENCOUNTER — Telehealth: Payer: Self-pay

## 2012-03-09 NOTE — Telephone Encounter (Signed)
Spoke with Jaclyn [name] and told her that Dr. Darrold Span would give 3 treatments if she is tolerating them and then do a scan to see if the cancer is responding to the treatment and then decide what the plan for treatment would be from that point. The tumors in her bowel are cancerous.   Dr. Darrold Span can tellher how many treatments she has had thus far at her visit 03-18-12. The social worker Kathrin Penner will call her to see what type of needs she will have at home after her treatments and assist with finding support services.

## 2012-03-11 ENCOUNTER — Encounter: Payer: Self-pay | Admitting: *Deleted

## 2012-03-11 NOTE — Progress Notes (Signed)
CSW received referral from [place], [place], to contact patient regarding home care needs. CSW attempted to contact patient and LVM requesting patient return call at her convenience.  Kathrin Penner, MSW, LCSW Clinical Social Worker [facility] [phone]

## 2012-03-18 ENCOUNTER — Ambulatory Visit: Payer: Medicare Other | Admitting: Oncology

## 2012-03-18 ENCOUNTER — Telehealth: Payer: Self-pay | Admitting: *Deleted

## 2012-03-18 ENCOUNTER — Ambulatory Visit (HOSPITAL_BASED_OUTPATIENT_CLINIC_OR_DEPARTMENT_OTHER): Payer: Medicare Other | Admitting: Oncology

## 2012-03-18 ENCOUNTER — Other Ambulatory Visit (HOSPITAL_BASED_OUTPATIENT_CLINIC_OR_DEPARTMENT_OTHER): Payer: Medicare Other | Admitting: Lab

## 2012-03-18 ENCOUNTER — Encounter: Payer: Self-pay | Admitting: Oncology

## 2012-03-18 ENCOUNTER — Telehealth: Payer: Self-pay | Admitting: Oncology

## 2012-03-18 VITALS — BP 115/71 | HR 83 | Temp 96.8°F | Resp 18 | Ht 64.0 in | Wt 179.6 lb

## 2012-03-18 DIAGNOSIS — I10 Essential (primary) hypertension: Secondary | ICD-10-CM

## 2012-03-18 DIAGNOSIS — C569 Malignant neoplasm of unspecified ovary: Secondary | ICD-10-CM

## 2012-03-18 DIAGNOSIS — D649 Anemia, unspecified: Secondary | ICD-10-CM

## 2012-03-18 LAB — CBC WITH DIFFERENTIAL/PLATELET
Basophils Absolute: 0 10*3/uL (ref 0.0–0.1)
EOS%: 4.6 % (ref 0.0–7.0)
Eosinophils Absolute: 0.4 10*3/uL (ref 0.0–0.5)
HCT: 29.2 % — ABNORMAL LOW (ref 34.8–46.6)
HGB: 9.6 g/dL — ABNORMAL LOW (ref 11.6–15.9)
MCH: 28.1 pg (ref 25.1–34.0)
MCV: 85.4 fL (ref 79.5–101.0)
MONO%: 5.1 % (ref 0.0–14.0)
NEUT#: 4.9 10*3/uL (ref 1.5–6.5)
NEUT%: 54.6 % (ref 38.4–76.8)
RDW: 15.2 % — ABNORMAL HIGH (ref 11.2–14.5)

## 2012-03-18 NOTE — Progress Notes (Signed)
OFFICE PROGRESS NOTE   03/18/2012   Physicians:W.Brewster, V.Bland, J.Mann, K.Reddy, M.Tsuei   INTERVAL HISTORY:  Patient is seen, together with one daughter, in follow up of recently progressive ovarian carcinoma complicated by bowel obstruction in Nov 2013 which resolved with conservative interventions.  She will begin taxol day 1, day 8 q 21 days on 03-21-12.  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 She had optimal debulking by Dr.Brewster, 10 liters of ascites at that procedure. She was treated with 8 cycles of taxol/carboplatin thru Aug. 2011, with CA125 still ~ 40 at completion of the chemotherapy. The tumor was ER + at 100% and she has continued tamoxifen as above. CA125 nadir was 20 in April 2012, with gradual rise since then, tho patient remained asymptomatic until the  SBO in Novem ber. She had been on Tamoxifen from Oct 2011until this was stopped in hospital. Patient had diarrhea prior to the admission and took imodium, which she feels may have contributed to the SBO. Information from the hospitalization including the CT has been reviewed also by Dr Nelly Rout. CA 125 was higher at 88 on 03-08-12.  Patient has felt well and enjoyed cooking Christmas dinner for family. Bowels are moving regularly and she denies abdominal or pelvic discomfort. She is trying to eat small meals several times daily as Dr Corliss Skains recommended. She has had no fever or symptoms of infection, no respiratory symptoms, no bleeding. Remainder of 10 point Review of Systems negative.  Patient has requested that  home care assistance while she is on chemo be arranged thru this office, however I do not know that there are any options available for this. Two daughters live with her, one of whom works until ~ 2 pm daily and the other is not employed but stays busy evangelizing, and another daughter + other family locally. I suggested  family and church friends might be best help for her. Objective:  Vital signs in last 24 hours:  BP 115/71  Pulse 83  Temp 96.8 F (36 C) (Oral)  Resp 18  Ht 5\' 4"  (1.626 m)  Wt 179 lb 9.6 oz (81.466 kg)  BMI 30.83 kg/m2 Weight is down 1.5 lbs. Easily ambulatory, respirations not labored, looks comfortable.  HEENT:PERRLA, sclera clear, anicteric, oropharynx clear, no lesions and neck supple with midline trachea Edentulous with poorly filling plates. Wearing wig. LymphaticsCervical, supraclavicular, and axillary nodes normal. No inguinal adenopathy. Resp: clear to auscultation bilaterally and normal percussion bilaterally Cardio: regular rate and rhythm GI: soft, non-tender; bowel sounds normal; no masses,  no organomegaly. Obese, not distended. Extremities: extremities normal, atraumatic, no cyanosis or edema Neuro:no sensory deficits noted Skin without rash or ecchymosis Portacath-without erythema or tenderness Lab Results:  Results for orders placed in visit on 03/18/12  CBC WITH DIFFERENTIAL      Component Value Range   WBC 9.0  3.9 - 10.3 10e3/uL   NEUT# 4.9  1.5 - 6.5 10e3/uL   HGB 9.6 (*) 11.6 - 15.9 g/dL   HCT 16.1 (*) 09.6 - 04.5 %   Platelets 217  145 - 400 10e3/uL   MCV 85.4  79.5 - 101.0 fL   MCH 28.1  25.1 - 34.0 pg   MCHC 32.9  31.5 - 36.0 g/dL   RBC 4.09 (*) 8.11 - 9.14 10e6/uL   RDW 15.2 (*) 11.2 - 14.5 %   lymph# 3.2  0.9 - 3.3 10e3/uL   MONO#  0.5  0.1 - 0.9 10e3/uL   Eosinophils Absolute 0.4  0.0 - 0.5 10e3/uL   Basophils Absolute 0.0  0.0 - 0.1 10e3/uL   NEUT% 54.6  38.4 - 76.8 %   LYMPH% 35.5  14.0 - 49.7 %   MONO% 5.1  0.0 - 14.0 %   EOS% 4.6  0.0 - 7.0 %   BASO% 0.2  0.0 - 2.0 %   nRBC 0  0 - 0 %    C diff negative 02-27-12 CA 125 88 on 03-08-12 Studies/Results:  No results found.  Medications: I have reviewed the patient's current medications. She will premedicate the weekly taxol with decadron 20 mg 12 hrs prior, directions given verbally  and written now.  She is not taking Abilify due to cost.  Assessment/Plan:  1. progression of recurrent ovarian carcinoma, recently complicated by self-limited SBO: Tamoxifen [facility] Nov 2013. Will resume chemo with low dose weekly taxol starting Dec 30. I will see her back 03-28-12 prior to day 8 cycle 1 taxol on 03-29-12. We expect to repeat scans after 3 cycles, and patient understands that 2 weeks' treatments make one cycle. 2.PAC in  3.C diff negative 02-26-12 4.multifactorial anemia: does not seem symptomatic now and hopefully the taxol will not drop counts too much. Note daughters are of Jehovah's Witness faith, but patient is not. 5.HTN  6.history of anxiety/ depression for which she is followed by psychiatry  7.flu vaccine done  Patient and daughter followed conversation well and were in agreement with plan Reece Packer, MD   03/18/2012, 4:05 PM

## 2012-03-18 NOTE — Telephone Encounter (Signed)
Per staff phone call and POF I have scheduled appts. JMW  

## 2012-03-18 NOTE — Patient Instructions (Addendum)
Decadron (dexamethasone, steroid) 4 mg :  Five tablets with food 12 hours before taxol chemotherapy.    Your first chemo will be on Dec 30 at 1230, so you need to take the decadron steroid with food about 1230 (midnight thirty) night of Dec 29

## 2012-03-18 NOTE — Telephone Encounter (Signed)
Gave pt appt calendar for December and January 2014 lab, MD and chemo

## 2012-03-21 ENCOUNTER — Ambulatory Visit (HOSPITAL_BASED_OUTPATIENT_CLINIC_OR_DEPARTMENT_OTHER): Payer: Medicare Other

## 2012-03-21 VITALS — BP 169/82 | HR 83 | Temp 97.9°F | Resp 18

## 2012-03-21 DIAGNOSIS — C569 Malignant neoplasm of unspecified ovary: Secondary | ICD-10-CM

## 2012-03-21 DIAGNOSIS — Z5111 Encounter for antineoplastic chemotherapy: Secondary | ICD-10-CM

## 2012-03-21 MED ORDER — ONDANSETRON 8 MG/50ML IVPB (CHCC)
8.0000 mg | Freq: Once | INTRAVENOUS | Status: AC
  Administered 2012-03-21: 8 mg via INTRAVENOUS

## 2012-03-21 MED ORDER — HEPARIN SOD (PORK) LOCK FLUSH 100 UNIT/ML IV SOLN
500.0000 [IU] | Freq: Once | INTRAVENOUS | Status: AC | PRN
  Administered 2012-03-21: 500 [IU]
  Filled 2012-03-21: qty 5

## 2012-03-21 MED ORDER — DEXAMETHASONE SODIUM PHOSPHATE 10 MG/ML IJ SOLN
20.0000 mg | Freq: Once | INTRAMUSCULAR | Status: AC
  Administered 2012-03-21: 20 mg via INTRAVENOUS

## 2012-03-21 MED ORDER — SODIUM CHLORIDE 0.9 % IJ SOLN
10.0000 mL | INTRAMUSCULAR | Status: DC | PRN
  Administered 2012-03-21: 10 mL
  Filled 2012-03-21: qty 10

## 2012-03-21 MED ORDER — DIPHENHYDRAMINE HCL 50 MG/ML IJ SOLN
50.0000 mg | Freq: Once | INTRAMUSCULAR | Status: AC
  Administered 2012-03-21: 50 mg via INTRAVENOUS

## 2012-03-21 MED ORDER — PACLITAXEL CHEMO INJECTION 300 MG/50ML
80.0000 mg/m2 | Freq: Once | INTRAVENOUS | Status: AC
  Administered 2012-03-21: 156 mg via INTRAVENOUS
  Filled 2012-03-21: qty 26

## 2012-03-21 MED ORDER — SODIUM CHLORIDE 0.9 % IV SOLN
Freq: Once | INTRAVENOUS | Status: AC
  Administered 2012-03-21: 12:00:00 via INTRAVENOUS

## 2012-03-21 MED ORDER — FAMOTIDINE IN NACL 20-0.9 MG/50ML-% IV SOLN
20.0000 mg | Freq: Once | INTRAVENOUS | Status: AC
  Administered 2012-03-21: 20 mg via INTRAVENOUS

## 2012-03-21 NOTE — Patient Instructions (Signed)
[facility] Discharge Instructions for Patients Receiving Chemotherapy  Today you received the following chemotherapy agents Taxol  To help prevent nausea and vomiting after your treatment, we encourage you to take your nausea medication as prescribed.   If you develop nausea and vomiting that is not controlled by your nausea medication, call the clinic. If it is after clinic hours your family physician or the after hours number for the clinic or go to the Emergency Department.   BELOW ARE SYMPTOMS THAT SHOULD BE REPORTED IMMEDIATELY:  *FEVER GREATER THAN 100.5 F  *CHILLS WITH OR WITHOUT FEVER  NAUSEA AND VOMITING THAT IS NOT CONTROLLED WITH YOUR NAUSEA MEDICATION  *UNUSUAL SHORTNESS OF BREATH  *UNUSUAL BRUISING OR BLEEDING  TENDERNESS IN MOUTH AND THROAT WITH OR WITHOUT PRESENCE OF ULCERS  *URINARY PROBLEMS  *BOWEL PROBLEMS  UNUSUAL RASH Items with * indicate a potential emergency and should be followed up as soon as possible.  One of the nurses will contact you 24 hours after your treatment. Please let the nurse know about any problems that you may have experienced. Feel free to call the clinic you have any questions or concerns. The clinic phone number is [phone].   I have been informed and understand all the instructions given to me. I know to contact the clinic, my physician, or go to the Emergency Department if any problems should occur. I do not have any questions at this time, but understand that I may call the clinic during office hours   should I have any questions or need assistance in obtaining follow up care.   Taxol (Paclitaxel)  What is this medicine? PACLITAXEL (PAK li TAX el) is a chemotherapy drug. It targets fast dividing cells, like cancer cells, and causes these cells to die. This medicine is used to treat ovarian cancer, breast cancer, and other cancers. This medicine may be used for other purposes; ask your health care provider or  pharmacist if you have questions.  What should I tell my health care provider before I take this medicine? They need to know if you have any of these conditions: -blood disorders -irregular heartbeat -infection (especially a virus infection such as chickenpox, cold sores, or herpes) -liver disease -previous or ongoing radiation therapy -an unusual or allergic reaction to paclitaxel, alcohol, polyoxyethylated castor oil, other chemotherapy agents, other medicines, foods, dyes, or preservatives -pregnant or trying to get pregnant -breast-feeding  How should I use this medicine? This drug is given as an infusion into a vein. It is administered in a hospital or clinic by a specially trained health care professional. Talk to your pediatrician regarding the use of this medicine in children. Special care may be needed. Overdosage: If you think you have taken too much of this medicine contact a poison control center or emergency room at once. NOTE: This medicine is only for you. Do not share this medicine with others.  What if I miss a dose? It is important not to miss your dose. Call your doctor or health care professional if you are unable to keep an appointment.  What may interact with this medicine? Do not take this medicine with any of the following medications: -disulfiram -metronidazole This medicine may also interact with the following medications: -cyclosporine -dexamethasone -diazepam -ketoconazole -medicines to increase blood counts like filgrastim, pegfilgrastim, sargramostim -other chemotherapy drugs like cisplatin, doxorubicin, epirubicin, etoposide, teniposide, vincristine -quinidine -testosterone -vaccines -verapamil Talk to your doctor or health care professional before taking any of these medicines: -  acetaminophen -aspirin -ibuprofen -ketoprofen -naproxen This list may not describe all possible interactions. Give your health care provider a list of all the  medicines, herbs, non-prescription drugs, or dietary supplements you use. Also tell them if you smoke, drink alcohol, or use illegal drugs. Some items may interact with your medicine.  What should I watch for while using this medicine? Your condition will be monitored carefully while you are receiving this medicine. You will need important blood work done while you are taking this medicine. This drug may make you feel generally unwell. This is not uncommon, as chemotherapy can affect healthy cells as well as cancer cells. Report any side effects. Continue your course of treatment even though you feel ill unless your doctor tells you to stop. In some cases, you may be given additional medicines to help with side effects. Follow all directions for their use. Call your doctor or health care professional for advice if you get a fever, chills or sore throat, or other symptoms of a cold or flu. Do not treat yourself. This drug decreases your body's ability to fight infections. Try to avoid being around people who are sick. This medicine may increase your risk to bruise or bleed. Call your doctor or health care professional if you notice any unusual bleeding. Be careful brushing and flossing your teeth or using a toothpick because you may get an infection or bleed more easily. If you have any dental work done, tell your dentist you are receiving this medicine. Avoid taking products that contain aspirin, acetaminophen, ibuprofen, naproxen, or ketoprofen unless instructed by your doctor. These medicines may hide a fever. Do not become pregnant while taking this medicine. Women should inform their doctor if they wish to become pregnant or think they might be pregnant. There is a potential for serious side effects to an unborn child. Talk to your health care professional or pharmacist for more information. Do not breast-feed an infant while taking this medicine. Men are advised not to father a child while receiving  this medicine.  What side effects may I notice from receiving this medicine? Side effects that you should report to your doctor or health care professional as soon as possible: -allergic reactions like skin rash, itching or hives, swelling of the face, lips, or tongue -low blood counts - This drug may decrease the number of white blood cells, red blood cells and platelets. You may be at increased risk for infections and bleeding. -signs of infection - fever or chills, cough, sore throat, pain or difficulty passing urine -signs of decreased platelets or bleeding - bruising, pinpoint red spots on the skin, black, tarry stools, nosebleeds -signs of decreased red blood cells - unusually weak or tired, fainting spells, lightheadedness -breathing problems -chest pain -high or low blood pressure -mouth sores -nausea and vomiting -pain, swelling, redness or irritation at the injection site -pain, tingling, numbness in the hands or feet -slow or irregular heartbeat -swelling of the ankle, feet, hands  Side effects that usually do not require medical attention (report to your doctor or health care professional if they continue or are bothersome): -bone pain -complete hair loss including hair on your head, underarms, pubic hair, eyebrows, and eyelashes -changes in the color of fingernails -diarrhea -loosening of the fingernails -loss of appetite -muscle or joint pain -red flush to skin -sweating This list may not describe all possible side effects. Call your doctor for medical advice about side effects. You may report side effects to FDA at 1-800-FDA-1088.   Where should I keep my medicine? This drug is given in a hospital or clinic and will not be stored at home. NOTE: This sheet is a summary. It may not cover all possible information. If you have questions about this medicine, talk to your doctor, pharmacist, or health care provider.  2012, [employer]. (02/20/2008 11:54:26 AM)  

## 2012-03-25 ENCOUNTER — Encounter (INDEPENDENT_AMBULATORY_CARE_PROVIDER_SITE_OTHER): Payer: Medicare Other | Admitting: Surgery

## 2012-03-28 ENCOUNTER — Ambulatory Visit (HOSPITAL_BASED_OUTPATIENT_CLINIC_OR_DEPARTMENT_OTHER): Payer: Medicare Other | Admitting: Oncology

## 2012-03-28 ENCOUNTER — Other Ambulatory Visit (HOSPITAL_BASED_OUTPATIENT_CLINIC_OR_DEPARTMENT_OTHER): Payer: Medicare Other | Admitting: Lab

## 2012-03-28 ENCOUNTER — Encounter: Payer: Self-pay | Admitting: Oncology

## 2012-03-28 ENCOUNTER — Telehealth: Payer: Self-pay | Admitting: Oncology

## 2012-03-28 VITALS — BP 129/72 | HR 93 | Temp 96.7°F | Resp 18 | Ht 64.0 in | Wt 179.8 lb

## 2012-03-28 DIAGNOSIS — D6481 Anemia due to antineoplastic chemotherapy: Secondary | ICD-10-CM

## 2012-03-28 DIAGNOSIS — C569 Malignant neoplasm of unspecified ovary: Secondary | ICD-10-CM

## 2012-03-28 DIAGNOSIS — E611 Iron deficiency: Secondary | ICD-10-CM

## 2012-03-28 DIAGNOSIS — D649 Anemia, unspecified: Secondary | ICD-10-CM

## 2012-03-28 LAB — CBC WITH DIFFERENTIAL/PLATELET
BASO%: 0.3 % (ref 0.0–2.0)
EOS%: 3.4 % (ref 0.0–7.0)
HCT: 28.9 % — ABNORMAL LOW (ref 34.8–46.6)
LYMPH%: 43.3 % (ref 14.0–49.7)
MCH: 27.9 pg (ref 25.1–34.0)
MCHC: 32.5 g/dL (ref 31.5–36.0)
MCV: 85.8 fL (ref 79.5–101.0)
MONO#: 0.2 10*3/uL (ref 0.1–0.9)
MONO%: 3.1 % (ref 0.0–14.0)
NEUT%: 49.9 % (ref 38.4–76.8)
Platelets: 243 10*3/uL (ref 145–400)
RBC: 3.37 10*6/uL — ABNORMAL LOW (ref 3.70–5.45)
WBC: 7.7 10*3/uL (ref 3.9–10.3)
nRBC: 0 % (ref 0–0)

## 2012-03-28 NOTE — Patient Instructions (Signed)
Appointments as scheduled. 

## 2012-03-28 NOTE — Progress Notes (Signed)
OFFICE PROGRESS NOTE   03/28/2012   Physicians: W.Brewster, V.Bland, J.Mann, K.Reddy   INTERVAL HISTORY:  Patient is seen, alone for visit, chemotherapy resumed for recently progressive ovarian cancer, this complicated by bowel obstruction in Nov 2013 which resolved without surgery. She had cycle 1 day 1 taxol on 03-21-12, planned day 1 day 8 q 21 days. She premedicated taxol with decadron 20 mg 12 hrs prior, and tolerated the first treatment well.   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 She had optimal debulking by Dr.Brewster, 10 liters of ascites at that procedure. She was treated with 8 cycles of taxol/carboplatin thru Aug. 2011, with CA125 still ~ 40 at completion of the chemotherapy. The tumor was ER + at 100% and she was maintained on tamoxifen from Oct 2011 until this was discontinued in Nov 2013. CA125 nadir was 20 in April 2012, with gradual rise since then tho patient remained asymptomatic until the recent SBO, and was 88 on 03-08-12. Hospitalization for the SBO was 11-13 thru 02-11-12. She has PAC in .  Patient was tired evening of day 3 and day 4, but had no nausea and no taxol aches. Appetite has been good and bowels have been moving daily. She denies vomiting, abdominal or pelvic pain, any bleeding, any fever or symptoms of infection. Remainder of 10 point Review of Systems negative.  Objective:  Vital signs in last 24 hours:  BP 129/72  Pulse 93  Temp 96.7 F (35.9 C) (Oral)  Resp 18  Ht 5\' 4"  (1.626 m)  Wt 179 lb 12.8 oz (81.557 kg)  BMI 30.86 kg/m2 Weight is stable    HEENT:PERRLA, sclera clear, anicteric and oropharynx clear, no lesions Minimal dull erythema bilateral posterior pharynx. LymphaticsCervical, supraclavicular, and axillary nodes normal. No inguinal adenopathy Resp: clear to auscultation bilaterally and normal percussion bilaterally Cardio: regular rate and  rhythm GI: obese, soft, not distended, quiet, nontender, no appreciable HSM or mass Extremities: extremities normal, atraumatic, no cyanosis or edema Neuro:no sensory deficits noted Skin without rash or ecchymosis Portacath -without erythema or tenderness  Lab Results:  Results for orders placed in visit on 03/28/12  CBC WITH DIFFERENTIAL      Component Value Range   WBC 7.7  3.9 - 10.3 10e3/uL   NEUT# 3.9  1.5 - 6.5 10e3/uL   HGB 9.4 (*) 11.6 - 15.9 g/dL   HCT 16.1 (*) 09.6 - 04.5 %   Platelets 243  145 - 400 10e3/uL   MCV 85.8  79.5 - 101.0 fL   MCH 27.9  25.1 - 34.0 pg   MCHC 32.5  31.5 - 36.0 g/dL   RBC 4.09 (*) 8.11 - 9.14 10e6/uL   RDW 15.2 (*) 11.2 - 14.5 %   lymph# 3.3  0.9 - 3.3 10e3/uL   MONO# 0.2  0.1 - 0.9 10e3/uL   Eosinophils Absolute 0.3  0.0 - 0.5 10e3/uL   Basophils Absolute 0.0  0.0 - 0.1 10e3/uL   NEUT% 49.9  38.4 - 76.8 %   LYMPH% 43.3  14.0 - 49.7 %   MONO% 3.1  0.0 - 14.0 %   EOS% 3.4  0.0 - 7.0 %   BASO% 0.3  0.0 - 2.0 %   nRBC 0  0 - 0 %    Hgb is slightly lower, having been 9.6 on 03-18-12.  Will add iron studies to next labs from [facility] 04-13-11, last somewhat low  in May 2013 and continuing oral iron since then. Studies/Results:  No results found.  Medications: I have reviewed the patient's current medications. I have asked financial staff to let me know which erythropoietin preparation can be used for her, but have not discussed with her yet or gotten consent papers.  Assessment/Plan: 1. Progressive ovarian carcinoma by CT in Nov, complicated by SBO also in Nov which resolved with conservative interventions. Now back on chemotherapy with weekly taxol, due day 8 cycle 1 on 03-29-12. I will see her on 1-20 with cbc, this prior to cycle 2 on 1-21 and 04-19-12. She will have CMET, iron studies and CA 125 from [facility] in infusion 04-12-12 and cbc before treatment 04-19-12.  I will also see her on 2-10 with CBC  prior to cycle 3 on 2-11 and 05-10-12, CBC plus CMET/CA  125 from [facility] on 05-10-12. 2.PAC in 3.anemia: multifactorial, including chemo and history of iron deficiency. It would be preferable from standpoint of daughters, who are Jehovah's Witnesses, to avoid blood transfusions. WIll recheck iron studies with labs from [facility] on 04-12-12. If iron ok, may need to try epogen. 4.HTN 5.history of psychiatric problems, followed by Dr Betti Cruz. She seems to be doing well now. 6. Flu vaccine done.   Patient is comfortable with plan above.  LIVESAY,LENNIS P, MD   03/28/2012, 3:21 PM

## 2012-03-28 NOTE — Telephone Encounter (Signed)
appts made and email sent to mw to add tx for 2/11 and 2/18,i will print for her at the 03/29/12 chemo appt      anne

## 2012-03-29 ENCOUNTER — Telehealth: Payer: Self-pay | Admitting: Oncology

## 2012-03-29 ENCOUNTER — Ambulatory Visit (HOSPITAL_BASED_OUTPATIENT_CLINIC_OR_DEPARTMENT_OTHER): Payer: Medicare Other

## 2012-03-29 VITALS — BP 117/56 | HR 82 | Temp 97.4°F | Resp 20

## 2012-03-29 DIAGNOSIS — Z5111 Encounter for antineoplastic chemotherapy: Secondary | ICD-10-CM

## 2012-03-29 DIAGNOSIS — C569 Malignant neoplasm of unspecified ovary: Secondary | ICD-10-CM

## 2012-03-29 MED ORDER — DIPHENHYDRAMINE HCL 50 MG/ML IJ SOLN
50.0000 mg | Freq: Once | INTRAMUSCULAR | Status: AC
  Administered 2012-03-29: 50 mg via INTRAVENOUS

## 2012-03-29 MED ORDER — SODIUM CHLORIDE 0.9 % IJ SOLN
10.0000 mL | INTRAMUSCULAR | Status: DC | PRN
  Filled 2012-03-29: qty 10

## 2012-03-29 MED ORDER — DEXAMETHASONE SODIUM PHOSPHATE 10 MG/ML IJ SOLN
20.0000 mg | Freq: Once | INTRAMUSCULAR | Status: AC
  Administered 2012-03-29: 20 mg via INTRAVENOUS

## 2012-03-29 MED ORDER — FAMOTIDINE IN NACL 20-0.9 MG/50ML-% IV SOLN
20.0000 mg | Freq: Once | INTRAVENOUS | Status: AC
  Administered 2012-03-29: 20 mg via INTRAVENOUS

## 2012-03-29 MED ORDER — SODIUM CHLORIDE 0.9 % IV SOLN
Freq: Once | INTRAVENOUS | Status: AC
  Administered 2012-03-29: 09:00:00 via INTRAVENOUS

## 2012-03-29 MED ORDER — PACLITAXEL CHEMO INJECTION 300 MG/50ML
80.0000 mg/m2 | Freq: Once | INTRAVENOUS | Status: AC
  Administered 2012-03-29: 156 mg via INTRAVENOUS
  Filled 2012-03-29: qty 26

## 2012-03-29 MED ORDER — ONDANSETRON 8 MG/50ML IVPB (CHCC)
8.0000 mg | Freq: Once | INTRAVENOUS | Status: AC
  Administered 2012-03-29: 8 mg via INTRAVENOUS

## 2012-03-29 MED ORDER — HEPARIN SOD (PORK) LOCK FLUSH 100 UNIT/ML IV SOLN
500.0000 [IU] | Freq: Once | INTRAVENOUS | Status: DC | PRN
  Filled 2012-03-29: qty 5

## 2012-03-29 NOTE — Telephone Encounter (Signed)
appts printed for pt today at tx appt     anne

## 2012-03-29 NOTE — Patient Instructions (Signed)
[facility] Discharge Instructions for Patients Receiving Chemotherapy  Today you received the following chemotherapy agents Taxol  To help prevent nausea and vomiting after your treatment, we encourage you to take your nausea medication as prescribed.  If you develop nausea and vomiting that is not controlled by your nausea medication, call the clinic. If it is after clinic hours your family physician or the after hours number for the clinic or go to the Emergency Department.   BELOW ARE SYMPTOMS THAT SHOULD BE REPORTED IMMEDIATELY:  *FEVER GREATER THAN 100.5 F  *CHILLS WITH OR WITHOUT FEVER  NAUSEA AND VOMITING THAT IS NOT CONTROLLED WITH YOUR NAUSEA MEDICATION  *UNUSUAL SHORTNESS OF BREATH  *UNUSUAL BRUISING OR BLEEDING  TENDERNESS IN MOUTH AND THROAT WITH OR WITHOUT PRESENCE OF ULCERS  *URINARY PROBLEMS  *BOWEL PROBLEMS  UNUSUAL RASH Items with * indicate a potential emergency and should be followed up as soon as possible.  One of the nurses will contact you 24 hours after your treatment. Please let the nurse know about any problems that you may have experienced. Feel free to call the clinic you have any questions or concerns. The clinic phone number is [phone].   I have been informed and understand all the instructions given to me. I know to contact the clinic, my physician, or go to the Emergency Department if any problems should occur. I do not have any questions at this time, but understand that I may call the clinic during office hours   should I have any questions or need assistance in obtaining follow up care.    __________________________________________  _____________  __________ Signature of Patient or Authorized Representative            Date                   Time    __________________________________________ Nurse's Signature    

## 2012-04-11 ENCOUNTER — Encounter: Payer: Self-pay | Admitting: Oncology

## 2012-04-11 ENCOUNTER — Ambulatory Visit (HOSPITAL_BASED_OUTPATIENT_CLINIC_OR_DEPARTMENT_OTHER): Payer: Medicare Other | Admitting: Oncology

## 2012-04-11 ENCOUNTER — Other Ambulatory Visit (HOSPITAL_BASED_OUTPATIENT_CLINIC_OR_DEPARTMENT_OTHER): Payer: Medicare Other | Admitting: Lab

## 2012-04-11 VITALS — BP 123/75 | HR 87 | Temp 97.9°F | Resp 20 | Ht 64.0 in | Wt 177.4 lb

## 2012-04-11 DIAGNOSIS — C569 Malignant neoplasm of unspecified ovary: Secondary | ICD-10-CM

## 2012-04-11 DIAGNOSIS — D6481 Anemia due to antineoplastic chemotherapy: Secondary | ICD-10-CM

## 2012-04-11 LAB — CBC WITH DIFFERENTIAL/PLATELET
Basophils Absolute: 0 10*3/uL (ref 0.0–0.1)
EOS%: 3.4 % (ref 0.0–7.0)
Eosinophils Absolute: 0.3 10*3/uL (ref 0.0–0.5)
HGB: 10.2 g/dL — ABNORMAL LOW (ref 11.6–15.9)
MCH: 28.3 pg (ref 25.1–34.0)
MONO%: 8.6 % (ref 0.0–14.0)
NEUT#: 4.3 10*3/uL (ref 1.5–6.5)
RBC: 3.6 10*6/uL — ABNORMAL LOW (ref 3.70–5.45)
RDW: 15.8 % — ABNORMAL HIGH (ref 11.2–14.5)
lymph#: 2.8 10*3/uL (ref 0.9–3.3)
nRBC: 0 % (ref 0–0)

## 2012-04-11 NOTE — Progress Notes (Signed)
OFFICE PROGRESS NOTE   04/11/2012   Physicians:W.Brewster, V.Bland, J.Mann, K.Reddy, M.Tsuei   INTERVAL HISTORY:   Patient is seen, together with daughter, in continuing attention to her recently progressive ovarian carcinoma which was complicated by a self-limited bowel obstruction in Nov 2013 and for which she had now had one cycle of taxol given 03-21-12 and 03-29-12.     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 She had optimal debulking by Dr.Brewster, 10 liters of ascites at that procedure. She was treated with 8 cycles of taxol/carboplatin thru Aug. 2011, with CA125 still ~ 40 at completion of the chemotherapy. The tumor was ER + at 100% and she has continued tamoxifen as above. CA125 nadir was 20 in April 2012, with gradual rise since then, tho patient remained asymptomatic until the SBO in Novem ber. She had been on Tamoxifen from Oct 2011until this was stopped in hospital. Patient had diarrhea prior to the admission and took imodium, which she feels may have contributed to the SBO. Information from the hospitalization including the CT has been reviewed also by Dr Nelly Rout. CA 125 was higher at 88 on 03-08-12.  Patient has generally felt well since I saw her last. She has had no nausea or vomiting, appetite is "about 75%", bowels are moving daily and she has not had abdominal pain. She is eating regular diet, including lima beans, okra and mashed potatoes yesterday and hash browns with [place] sausage this AM. She has occasional hand cramps and fingers feel numb when hands are cramping, no peripheral neuropathy otherwise. She felt slightly dizzy shortly after chemo, none now, and Dr Parke Simmers has decreased Hyzaar.She has had no fever or symptoms of infection. She notices "mouth foaming" as she also described at initial presentation in 2011, etiology not clear then either.Overall she and daughter are very  pleased with how well she did with the low dose taxol. Remainder of 10 point Review of Systems negative.  Objective:  Vital signs in last 24 hours:  BP 123/75  Pulse 87  Temp 97.9 F (36.6 C) (Oral)  Resp 20  Ht 5\' 4"  (1.626 m)  Wt 177 lb 6.4 oz (80.468 kg)  BMI 30.45 kg/m2 Weight is down ~ 1.5 lbs. Easily ambulatory, looks comfortable, respirations not labored RA. Alert and appropriate. Wiping mouth frequently but no moisture obvious when she does this.     HEENT:PERRLA, sclera clear, anicteric, oropharynx clear, no lesions and neck supple with midline trachea. Wearing wig. Dentures fit poorly, as has been case. LymphaticsCervical, supraclavicular, and axillary nodes normal. No appreciable inguinal adenopathy Resp: clear to auscultation bilaterally and normal percussion bilaterally Cardio: regular rate and rhythm GI: full but not tight, soft, not tender, diminished but otherwise normal bowel sounds. No appreciable HSM or mass Extremities: extremities normal, atraumatic, no cyanosis or edema Neuro:no sensory deficits noted Skin without rash or ecchymosis Portacath -without erythema or tenderness  Lab Results:  Results for orders placed in visit on 04/11/12  CBC WITH DIFFERENTIAL      Component Value Range   WBC 8.1  3.9 - 10.3 10e3/uL   NEUT# 4.3  1.5 - 6.5 10e3/uL   HGB 10.2 (*) 11.6 - 15.9 g/dL   HCT 40.9 (*) 81.1 - 91.4 %   Platelets 261  145 - 400 10e3/uL   MCV 86.7  79.5 - 101.0 fL   MCH 28.3  25.1 - 34.0 pg   MCHC 32.7  31.5 - 36.0 g/dL   RBC 1.19 (*) 1.47 - 8.29 10e6/uL   RDW 15.8 (*) 11.2 - 14.5 %   lymph# 2.8  0.9 - 3.3 10e3/uL   MONO# 0.7  0.1 - 0.9 10e3/uL   Eosinophils Absolute 0.3  0.0 - 0.5 10e3/uL   Basophils Absolute 0.0  0.0 - 0.1 10e3/uL   NEUT% 52.8  38.4 - 76.8 %   LYMPH% 35.0  14.0 - 49.7 %   MONO% 8.6  0.0 - 14.0 %   EOS% 3.4  0.0 - 7.0 %   BASO% 0.2  0.0 - 2.0 %   nRBC 0  0 - 0 %     Studies/Results:  No results found.  Medications: I  have reviewed the patient's current medications. She does premedicate weekly taxol with decadron 20 mg 12 hrs prior.  Financial staff has begun application for aranesp, for chemo anemia.  Assessment/Plan:  1.Further progression of recurrent ovarian carcinoma, recently complicated by self-limited SBO: Tamoxifen [facility] Nov 2013.She did well with cycle 1 weekly taxol 12-30 and 03-29-12, and will have cycle 2 on 1-21 and 04-19-12. I will see her  2-10 prior to cycle 3 beginning 05-03-12. She will have CBC with each weekly treatment, and CMEt + CA 125 on 1-21 and on 05-10-12. 2.PAC in  3.C diff negative early Dec  4.anemia: hgb a little better today. 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 5.HTN : BP meds adjusted by Dr Parke Simmers 6.history of anxiety/ depression for which she is followed by psychiatry  7.flu vaccine done  Patient and daughter are in agreement with plan above.  LIVESAY,LENNIS P, MD   04/11/2012, 1:22 PM

## 2012-04-11 NOTE — Patient Instructions (Signed)
Decadron (dexamethasone, steroid) five tablets (= 20 mg ) with food at 9:30 PM tonight, which will be 12 hrs before chemo 9:30 AM on 04-12-12

## 2012-04-12 ENCOUNTER — Ambulatory Visit (HOSPITAL_BASED_OUTPATIENT_CLINIC_OR_DEPARTMENT_OTHER): Payer: Medicare Other

## 2012-04-12 ENCOUNTER — Other Ambulatory Visit (HOSPITAL_BASED_OUTPATIENT_CLINIC_OR_DEPARTMENT_OTHER): Payer: Medicare Other | Admitting: Lab

## 2012-04-12 VITALS — BP 142/60 | HR 82 | Temp 97.4°F | Resp 20

## 2012-04-12 DIAGNOSIS — E611 Iron deficiency: Secondary | ICD-10-CM

## 2012-04-12 DIAGNOSIS — C569 Malignant neoplasm of unspecified ovary: Secondary | ICD-10-CM

## 2012-04-12 DIAGNOSIS — Z5111 Encounter for antineoplastic chemotherapy: Secondary | ICD-10-CM

## 2012-04-12 LAB — IRON AND TIBC
Iron: 49 ug/dL (ref 42–145)
UIBC: 215 ug/dL (ref 125–400)

## 2012-04-12 LAB — CA 125: CA 125: 81.3 U/mL — ABNORMAL HIGH (ref 0.0–30.2)

## 2012-04-12 LAB — COMPREHENSIVE METABOLIC PANEL (CC13)
Alkaline Phosphatase: 66 U/L (ref 40–150)
BUN: 15 mg/dL (ref 7.0–26.0)
CO2: 20 mEq/L — ABNORMAL LOW (ref 22–29)
Glucose: 161 mg/dl — ABNORMAL HIGH (ref 70–99)
Total Bilirubin: 0.25 mg/dL (ref 0.20–1.20)

## 2012-04-12 MED ORDER — PACLITAXEL CHEMO INJECTION 300 MG/50ML
80.0000 mg/m2 | Freq: Once | INTRAVENOUS | Status: AC
  Administered 2012-04-12: 156 mg via INTRAVENOUS
  Filled 2012-04-12: qty 26

## 2012-04-12 MED ORDER — ONDANSETRON 8 MG/50ML IVPB (CHCC)
8.0000 mg | Freq: Once | INTRAVENOUS | Status: AC
  Administered 2012-04-12: 8 mg via INTRAVENOUS

## 2012-04-12 MED ORDER — SODIUM CHLORIDE 0.9 % IJ SOLN
10.0000 mL | INTRAMUSCULAR | Status: DC | PRN
  Administered 2012-04-12: 10 mL
  Filled 2012-04-12: qty 10

## 2012-04-12 MED ORDER — DIPHENHYDRAMINE HCL 50 MG/ML IJ SOLN
50.0000 mg | Freq: Once | INTRAMUSCULAR | Status: AC
Start: 2012-04-12 — End: 2012-04-12
  Administered 2012-04-12: 50 mg via INTRAVENOUS

## 2012-04-12 MED ORDER — SODIUM CHLORIDE 0.9 % IV SOLN
Freq: Once | INTRAVENOUS | Status: AC
  Administered 2012-04-12: 10:00:00 via INTRAVENOUS

## 2012-04-12 MED ORDER — DEXAMETHASONE SODIUM PHOSPHATE 4 MG/ML IJ SOLN
20.0000 mg | Freq: Once | INTRAMUSCULAR | Status: AC
  Administered 2012-04-12: 20 mg via INTRAVENOUS

## 2012-04-12 MED ORDER — HEPARIN SOD (PORK) LOCK FLUSH 100 UNIT/ML IV SOLN
500.0000 [IU] | Freq: Once | INTRAVENOUS | Status: AC | PRN
  Administered 2012-04-12: 500 [IU]
  Filled 2012-04-12: qty 5

## 2012-04-12 MED ORDER — FAMOTIDINE IN NACL 20-0.9 MG/50ML-% IV SOLN
20.0000 mg | Freq: Once | INTRAVENOUS | Status: AC
  Administered 2012-04-12: 20 mg via INTRAVENOUS

## 2012-04-12 NOTE — Patient Instructions (Addendum)
[facility] Discharge Instructions for Patients Receiving Chemotherapy  Today you received the following chemotherapy agents;  Taxol.  To help prevent nausea and vomiting after your treatment, we encourage you to take your nausea medication ;  Zofran (0ndansetron) as directed.   If you develop nausea and vomiting that is not controlled by your nausea medication, call the clinic. If it is after clinic hours your family physician or the after hours number for the clinic or go to the Emergency Department.   BELOW ARE SYMPTOMS THAT SHOULD BE REPORTED IMMEDIATELY:  *FEVER GREATER THAN 100.5 F  *CHILLS WITH OR WITHOUT FEVER  NAUSEA AND VOMITING THAT IS NOT CONTROLLED WITH YOUR NAUSEA MEDICATION  *UNUSUAL SHORTNESS OF BREATH  *UNUSUAL BRUISING OR BLEEDING  TENDERNESS IN MOUTH AND THROAT WITH OR WITHOUT PRESENCE OF ULCERS  *URINARY PROBLEMS  *BOWEL PROBLEMS  UNUSUAL RASH Items with * indicate a potential emergency and should be followed up as soon as possible.  . Feel free to call the clinic you have any questions or concerns. The clinic phone number is [phone].   I have been informed and understand all the instructions given to me. I know to contact the clinic, my physician, or go to the Emergency Department if any problems should occur. I do not have any questions at this time, but understand that I may call the clinic during office hours   should I have any questions or need assistance in obtaining follow up care.    __________________________________________  _____________  __________ Signature of Patient or Authorized Representative            Date                   Time    __________________________________________ Nurse's Signature

## 2012-04-19 ENCOUNTER — Other Ambulatory Visit (HOSPITAL_BASED_OUTPATIENT_CLINIC_OR_DEPARTMENT_OTHER): Payer: Medicare Other | Admitting: Lab

## 2012-04-19 ENCOUNTER — Ambulatory Visit (HOSPITAL_BASED_OUTPATIENT_CLINIC_OR_DEPARTMENT_OTHER): Payer: Medicare Other

## 2012-04-19 VITALS — BP 165/77 | HR 88 | Temp 97.5°F | Resp 20

## 2012-04-19 DIAGNOSIS — C569 Malignant neoplasm of unspecified ovary: Secondary | ICD-10-CM

## 2012-04-19 DIAGNOSIS — Z5111 Encounter for antineoplastic chemotherapy: Secondary | ICD-10-CM

## 2012-04-19 LAB — CBC WITH DIFFERENTIAL/PLATELET
BASO%: 0.2 % (ref 0.0–2.0)
Basophils Absolute: 0 10*3/uL (ref 0.0–0.1)
EOS%: 0 % (ref 0.0–7.0)
HGB: 10.1 g/dL — ABNORMAL LOW (ref 11.6–15.9)
MCH: 28.7 pg (ref 25.1–34.0)
MCHC: 33.1 g/dL (ref 31.5–36.0)
MCV: 86.6 fL (ref 79.5–101.0)
MONO%: 0.6 % (ref 0.0–14.0)
RDW: 15.6 % — ABNORMAL HIGH (ref 11.2–14.5)

## 2012-04-19 MED ORDER — DIPHENHYDRAMINE HCL 50 MG/ML IJ SOLN
50.0000 mg | Freq: Once | INTRAMUSCULAR | Status: AC
  Administered 2012-04-19: 50 mg via INTRAVENOUS

## 2012-04-19 MED ORDER — PACLITAXEL CHEMO INJECTION 300 MG/50ML
80.0000 mg/m2 | Freq: Once | INTRAVENOUS | Status: AC
  Administered 2012-04-19: 156 mg via INTRAVENOUS
  Filled 2012-04-19: qty 26

## 2012-04-19 MED ORDER — DEXAMETHASONE SODIUM PHOSPHATE 4 MG/ML IJ SOLN
20.0000 mg | Freq: Once | INTRAMUSCULAR | Status: AC
  Administered 2012-04-19: 20 mg via INTRAVENOUS

## 2012-04-19 MED ORDER — SODIUM CHLORIDE 0.9 % IV SOLN
Freq: Once | INTRAVENOUS | Status: AC
  Administered 2012-04-19: 13:00:00 via INTRAVENOUS

## 2012-04-19 MED ORDER — ONDANSETRON 8 MG/50ML IVPB (CHCC)
8.0000 mg | Freq: Once | INTRAVENOUS | Status: AC
  Administered 2012-04-19: 8 mg via INTRAVENOUS

## 2012-04-19 MED ORDER — FAMOTIDINE IN NACL 20-0.9 MG/50ML-% IV SOLN
20.0000 mg | Freq: Once | INTRAVENOUS | Status: AC
  Administered 2012-04-19: 20 mg via INTRAVENOUS

## 2012-04-19 MED ORDER — HEPARIN SOD (PORK) LOCK FLUSH 100 UNIT/ML IV SOLN
500.0000 [IU] | Freq: Once | INTRAVENOUS | Status: AC | PRN
  Administered 2012-04-19: 500 [IU]
  Filled 2012-04-19: qty 5

## 2012-04-19 MED ORDER — SODIUM CHLORIDE 0.9 % IJ SOLN
10.0000 mL | INTRAMUSCULAR | Status: DC | PRN
Start: 2012-04-19 — End: 2012-04-19
  Administered 2012-04-19: 10 mL
  Filled 2012-04-19: qty 10

## 2012-05-01 ENCOUNTER — Other Ambulatory Visit: Payer: Self-pay | Admitting: Oncology

## 2012-05-02 ENCOUNTER — Encounter: Payer: Self-pay | Admitting: Oncology

## 2012-05-02 ENCOUNTER — Ambulatory Visit (HOSPITAL_BASED_OUTPATIENT_CLINIC_OR_DEPARTMENT_OTHER): Payer: Medicare Other | Admitting: Oncology

## 2012-05-02 ENCOUNTER — Telehealth: Payer: Self-pay | Admitting: Oncology

## 2012-05-02 ENCOUNTER — Other Ambulatory Visit (HOSPITAL_BASED_OUTPATIENT_CLINIC_OR_DEPARTMENT_OTHER): Payer: Medicare Other | Admitting: Lab

## 2012-05-02 ENCOUNTER — Other Ambulatory Visit: Payer: Self-pay

## 2012-05-02 VITALS — BP 126/70 | HR 100 | Temp 97.7°F | Resp 20 | Ht 64.0 in | Wt 182.4 lb

## 2012-05-02 DIAGNOSIS — D6481 Anemia due to antineoplastic chemotherapy: Secondary | ICD-10-CM

## 2012-05-02 DIAGNOSIS — C569 Malignant neoplasm of unspecified ovary: Secondary | ICD-10-CM

## 2012-05-02 LAB — CBC WITH DIFFERENTIAL/PLATELET
EOS%: 3.6 % (ref 0.0–7.0)
MCHC: 32.5 g/dL (ref 31.5–36.0)
MCV: 88 fL (ref 79.5–101.0)
MONO#: 0.6 10*3/uL (ref 0.1–0.9)
NEUT%: 48.3 % (ref 38.4–76.8)
Platelets: 257 10*3/uL (ref 145–400)
RBC: 3.5 10*6/uL — ABNORMAL LOW (ref 3.70–5.45)
RDW: 16.7 % — ABNORMAL HIGH (ref 11.2–14.5)
WBC: 7.2 10*3/uL (ref 3.9–10.3)

## 2012-05-02 MED ORDER — DEXAMETHASONE 4 MG PO TABS: ORAL_TABLET | ORAL | Status: DC

## 2012-05-02 NOTE — Progress Notes (Signed)
OFFICE PROGRESS NOTE   05/02/2012   Physicians:W.Brewster, V.Bland, J.Mann, K.Reddy   INTERVAL HISTORY:   Patient is seen,     , now back on chemotherapy for recently progressive ovarian cancer which was complicated by self-limited bowel obstruction in November. She is receiving taxol day1 day 8 q 21 days, cycle 1 on 12-30 and 03-29-12 and cycle 2 on 1-21 and 04-19-12. She has not had gCSF. She does have PAC.  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 She had optimal debulking by Dr.Brewster, 10 liters of ascites at that procedure. She was treated with 8 cycles of taxol/carboplatin thru Aug. 2011, with CA125 still ~ 40 at completion of the chemotherapy. The tumor was ER + at 100% and she was maintained on tamoxifen from Oct 2011 until this was discontinued in Nov 2013. CA125 nadir was 20 in April 2012, with gradual rise since then tho patient remained asymptomatic until the recent SBO, and was 88 on 03-08-12. Hospitalization for the SBO was 11-13 thru 02-11-12.  Patient has done well with the weekly taxol thus far, with no nausea, no increase in peripheral neuropathy (slight symptoms left hand only which preceded taxol and are stable) and no significant fatigue. She has had no abdominal pain, bowels are moving regularly and she is tolerating regular diet/ regular size meals. She has had no fever or symptoms of infection, no respiratory symptoms, no bleeding, no problems with PAC. Remainder of 10 point Review of Systems negative.  Objective:  Vital signs in last 24 hours: Weight is up 5 lbs to 182 lbs 6 oz,  126/70, resp 20 not labored RA, HR 100 regular, temp 97.7 Alert, easily ambulatory, looks comfortable.   HEENT:PERRLA, sclera clear, anicteric and oropharynx clear, no lesions LymphaticsCervical, supraclavicular, and axillary nodes normal. Resp: clear to auscultation bilaterally and normal percussion  bilaterally Cardio: regular rate and rhythm GI: full, soft, not distended, some bowel sounds normally active, no mass or HSM appreciated Extremities: extremities normal, atraumatic, no cyanosis or edema Neuro:slight decreased sensation left fingers only Skin without rash or ecchymosis Portacath -without erythema or tenderness  Lab Results:  Results for orders placed in visit on 04/19/12  CBC WITH DIFFERENTIAL      Result Value Range   WBC 5.3  3.9 - 10.3 10e3/uL   NEUT# 4.1  1.5 - 6.5 10e3/uL   HGB 10.1 (*) 11.6 - 15.9 g/dL   HCT 16.1 (*) 09.6 - 04.5 %   Platelets 284  145 - 400 10e3/uL   MCV 86.6  79.5 - 101.0 fL   MCH 28.7  25.1 - 34.0 pg   MCHC 33.1  31.5 - 36.0 g/dL   RBC 4.09 (*) 8.11 - 9.14 10e6/uL   RDW 15.6 (*) 11.2 - 14.5 %   lymph# 1.1  0.9 - 3.3 10e3/uL   MONO# 0.0 (*) 0.1 - 0.9 10e3/uL   Eosinophils Absolute 0.0  0.0 - 0.5 10e3/uL   Basophils Absolute 0.0  0.0 - 0.1 10e3/uL   NEUT% 78.3 (*) 38.4 - 76.8 %   LYMPH% 20.9  14.0 - 49.7 %   MONO% 0.6  0.0 - 14.0 %   EOS% 0.0  0.0 - 7.0 %   BASO% 0.2  0.0 - 2.0 %   nRBC 0  0 - 0 %    CMET and CA 125 to be drawn from [facility] with day 8 treatment 05-10-12. Studies/Results:  No  results found.  Medications: I have reviewed the patient's current medications.  Patient has asked when she will have repeat CT. As she is tolerating treatment well and clinically doing well, I would be comfortable waiting until after cycle 4 as long as CA 125 on 05-10-12 is stable or improving.  Assessment/Plan: 1. progression of recurrent ovarian carcinoma, recently complicated by self-limited SBO: will continue weekly taxol with cycle 3 on 2-11 and 05-10-12 as long as ANC >=1.5 and plt >=100k each day. Will recheck chemistries and ca125 from PAC on 2-18. Repeat CT possibly after cycle 4 as above. I will see her 05-23-12 or sooner if needed 2.PAC in  3.C diff negative early Dec  4.anemia: hgb stable. 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  5.HTN : BP good today 6.history of anxiety/ depression for which she is followed by psychiatry  7.flu vaccine done    Juris Gosnell P, MD   05/02/2012, 9:23 AM

## 2012-05-02 NOTE — Telephone Encounter (Signed)
gv pt appt schedule for February and March.  °

## 2012-05-02 NOTE — Patient Instructions (Signed)
Decadron (dexamethasone, steroid) five tablets (=20 mg) 12 hrs prior to chemo each time

## 2012-05-03 ENCOUNTER — Ambulatory Visit (HOSPITAL_BASED_OUTPATIENT_CLINIC_OR_DEPARTMENT_OTHER): Payer: Medicare Other

## 2012-05-03 VITALS — BP 143/81 | HR 103 | Temp 97.0°F

## 2012-05-03 DIAGNOSIS — Z5111 Encounter for antineoplastic chemotherapy: Secondary | ICD-10-CM

## 2012-05-03 DIAGNOSIS — C569 Malignant neoplasm of unspecified ovary: Secondary | ICD-10-CM

## 2012-05-03 MED ORDER — DEXAMETHASONE SODIUM PHOSPHATE 4 MG/ML IJ SOLN
20.0000 mg | Freq: Once | INTRAMUSCULAR | Status: AC
  Administered 2012-05-03: 20 mg via INTRAVENOUS

## 2012-05-03 MED ORDER — PACLITAXEL CHEMO INJECTION 300 MG/50ML
80.0000 mg/m2 | Freq: Once | INTRAVENOUS | Status: AC
  Administered 2012-05-03: 156 mg via INTRAVENOUS
  Filled 2012-05-03: qty 26

## 2012-05-03 MED ORDER — SODIUM CHLORIDE 0.9 % IJ SOLN
10.0000 mL | INTRAMUSCULAR | Status: DC | PRN
  Administered 2012-05-03: 10 mL
  Filled 2012-05-03: qty 10

## 2012-05-03 MED ORDER — DIPHENHYDRAMINE HCL 50 MG/ML IJ SOLN
50.0000 mg | Freq: Once | INTRAMUSCULAR | Status: AC
  Administered 2012-05-03: 50 mg via INTRAVENOUS

## 2012-05-03 MED ORDER — SODIUM CHLORIDE 0.9 % IV SOLN
Freq: Once | INTRAVENOUS | Status: AC
  Administered 2012-05-03: 09:00:00 via INTRAVENOUS

## 2012-05-03 MED ORDER — HEPARIN SOD (PORK) LOCK FLUSH 100 UNIT/ML IV SOLN
500.0000 [IU] | Freq: Once | INTRAVENOUS | Status: AC | PRN
  Administered 2012-05-03: 500 [IU]
  Filled 2012-05-03: qty 5

## 2012-05-03 MED ORDER — ONDANSETRON 8 MG/50ML IVPB (CHCC)
8.0000 mg | Freq: Once | INTRAVENOUS | Status: AC
  Administered 2012-05-03: 8 mg via INTRAVENOUS

## 2012-05-03 MED ORDER — FAMOTIDINE IN NACL 20-0.9 MG/50ML-% IV SOLN
20.0000 mg | Freq: Once | INTRAVENOUS | Status: AC
  Administered 2012-05-03: 20 mg via INTRAVENOUS

## 2012-05-03 NOTE — Patient Instructions (Addendum)
[facility] Discharge Instructions for Patients Receiving Chemotherapy  Today you received the following chemotherapy agents  Taxol To help prevent nausea and vomiting after your treatment, we encourage you to take your nausea medication as prescribed.  If you develop nausea and vomiting that is not controlled by your nausea medication, call the clinic. If it is after clinic hours your family physician or the after hours number for the clinic or go to the Emergency Department. Do not drive as you have received 50 mg IV benadryl.  BELOW ARE SYMPTOMS THAT SHOULD BE REPORTED IMMEDIATELY:  *FEVER GREATER THAN 100.5 F  *CHILLS WITH OR WITHOUT FEVER  NAUSEA AND VOMITING THAT IS NOT CONTROLLED WITH YOUR NAUSEA MEDICATION  *UNUSUAL SHORTNESS OF BREATH  *UNUSUAL BRUISING OR BLEEDING  TENDERNESS IN MOUTH AND THROAT WITH OR WITHOUT PRESENCE OF ULCERS  *URINARY PROBLEMS  *BOWEL PROBLEMS  UNUSUAL RASH Items with * indicate a potential emergency and should be followed up as soon as possible.  One of the nurses will contact you 24 hours after your treatment. Please let the nurse know about any problems that you may have experienced. Feel free to call the clinic you have any questions or concerns. The clinic phone number is [phone].   I have been informed and understand all the instructions given to me. I know to contact the clinic, my physician, or go to the Emergency Department if any problems should occur. I do not have any questions at this time, but understand that I may call the clinic during office hours   should I have any questions or need assistance in obtaining follow up care.    __________________________________________  _____________  __________ Signature of Patient or Authorized Representative            Date                   Time    __________________________________________ Nurse's Signature

## 2012-05-03 NOTE — Progress Notes (Signed)
Patients daughter Velna Hatchet is driving the patient home today from our office. Patient verbalized understanding she is not to drive as she received 50 mg IV benadryl.

## 2012-05-05 ENCOUNTER — Telehealth: Payer: Self-pay

## 2012-05-05 NOTE — Telephone Encounter (Signed)
Sent a copy of 05-02-12 office note and labs to Dr. Parke Simmers as patient requested.

## 2012-05-10 ENCOUNTER — Ambulatory Visit (HOSPITAL_BASED_OUTPATIENT_CLINIC_OR_DEPARTMENT_OTHER): Payer: Medicare Other

## 2012-05-10 ENCOUNTER — Other Ambulatory Visit (HOSPITAL_BASED_OUTPATIENT_CLINIC_OR_DEPARTMENT_OTHER): Payer: Medicare Other | Admitting: Lab

## 2012-05-10 VITALS — BP 139/78 | HR 100 | Temp 97.1°F | Resp 20

## 2012-05-10 DIAGNOSIS — C569 Malignant neoplasm of unspecified ovary: Secondary | ICD-10-CM

## 2012-05-10 DIAGNOSIS — Z5111 Encounter for antineoplastic chemotherapy: Secondary | ICD-10-CM

## 2012-05-10 LAB — CBC WITH DIFFERENTIAL/PLATELET
Eosinophils Absolute: 0 10*3/uL (ref 0.0–0.5)
LYMPH%: 20.6 % (ref 14.0–49.7)
MCV: 86 fL (ref 79.5–101.0)
MONO%: 0.8 % (ref 0.0–14.0)
NEUT#: 4.8 10*3/uL (ref 1.5–6.5)
NEUT%: 78.6 % — ABNORMAL HIGH (ref 38.4–76.8)
Platelets: 273 10*3/uL (ref 145–400)
RBC: 3.64 10*6/uL — ABNORMAL LOW (ref 3.70–5.45)
nRBC: 0 % (ref 0–0)

## 2012-05-10 LAB — COMPREHENSIVE METABOLIC PANEL (CC13)
AST: 11 U/L (ref 5–34)
BUN: 13.6 mg/dL (ref 7.0–26.0)
CO2: 20 mEq/L — ABNORMAL LOW (ref 22–29)
Calcium: 9.9 mg/dL (ref 8.4–10.4)
Chloride: 104 mEq/L (ref 98–107)
Creatinine: 1 mg/dL (ref 0.6–1.1)
Glucose: 151 mg/dl — ABNORMAL HIGH (ref 70–99)

## 2012-05-10 MED ORDER — SODIUM CHLORIDE 0.9 % IV SOLN
Freq: Once | INTRAVENOUS | Status: AC
  Administered 2012-05-10: 10:00:00 via INTRAVENOUS

## 2012-05-10 MED ORDER — ONDANSETRON 8 MG/50ML IVPB (CHCC)
8.0000 mg | Freq: Once | INTRAVENOUS | Status: AC
  Administered 2012-05-10: 8 mg via INTRAVENOUS

## 2012-05-10 MED ORDER — SODIUM CHLORIDE 0.9 % IJ SOLN
10.0000 mL | INTRAMUSCULAR | Status: DC | PRN
  Administered 2012-05-10: 10 mL
  Filled 2012-05-10: qty 10

## 2012-05-10 MED ORDER — PACLITAXEL CHEMO INJECTION 300 MG/50ML
80.0000 mg/m2 | Freq: Once | INTRAVENOUS | Status: AC
  Administered 2012-05-10: 156 mg via INTRAVENOUS
  Filled 2012-05-10: qty 26

## 2012-05-10 MED ORDER — HEPARIN SOD (PORK) LOCK FLUSH 100 UNIT/ML IV SOLN
500.0000 [IU] | Freq: Once | INTRAVENOUS | Status: AC | PRN
  Administered 2012-05-10: 500 [IU]
  Filled 2012-05-10: qty 5

## 2012-05-10 MED ORDER — FAMOTIDINE IN NACL 20-0.9 MG/50ML-% IV SOLN
20.0000 mg | Freq: Once | INTRAVENOUS | Status: AC
  Administered 2012-05-10: 20 mg via INTRAVENOUS

## 2012-05-10 MED ORDER — DEXAMETHASONE SODIUM PHOSPHATE 4 MG/ML IJ SOLN
20.0000 mg | Freq: Once | INTRAMUSCULAR | Status: AC
  Administered 2012-05-10: 20 mg via INTRAVENOUS

## 2012-05-10 MED ORDER — DIPHENHYDRAMINE HCL 50 MG/ML IJ SOLN
50.0000 mg | Freq: Once | INTRAMUSCULAR | Status: AC
  Administered 2012-05-10: 50 mg via INTRAVENOUS

## 2012-05-10 NOTE — Patient Instructions (Addendum)
[facility] Discharge Instructions for Patients Receiving Chemotherapy  Today you received the following chemotherapy agents taxol.  To help prevent nausea and vomiting after your treatment, we encourage you to take your nausea medication zofran Begin taking it at  tonight and take it as often as prescribed.   If you develop nausea and vomiting that is not controlled by your nausea medication, call the clinic. If it is after clinic hours your family physician or the after hours number for the clinic or go to the Emergency Department.   BELOW ARE SYMPTOMS THAT SHOULD BE REPORTED IMMEDIATELY:  *FEVER GREATER THAN 100.5 F  *CHILLS WITH OR WITHOUT FEVER  NAUSEA AND VOMITING THAT IS NOT CONTROLLED WITH YOUR NAUSEA MEDICATION  *UNUSUAL SHORTNESS OF BREATH  *UNUSUAL BRUISING OR BLEEDING  TENDERNESS IN MOUTH AND THROAT WITH OR WITHOUT PRESENCE OF ULCERS  *URINARY PROBLEMS  *BOWEL PROBLEMS  UNUSUAL RASH Items with * indicate a potential emergency and should be followed up as soon as possible. . Feel free to call the clinic you have any questions or concerns. The clinic phone number is [phone].   I have been informed and understand all the instructions given to me. I know to contact the clinic, my physician, or go to the Emergency Department if any problems should occur. I do not have any questions at this time, but understand that I may call the clinic during office hours   should I have any questions or need assistance in obtaining follow up care.    __________________________________________  _____________  __________ Signature of Patient or Authorized Representative            Date                   Time    __________________________________________ Nurse's Signature

## 2012-05-21 ENCOUNTER — Other Ambulatory Visit: Payer: Self-pay | Admitting: Oncology

## 2012-05-21 HISTORY — PX: TRANSTHORACIC ECHOCARDIOGRAM: SHX275

## 2012-05-23 ENCOUNTER — Other Ambulatory Visit (HOSPITAL_BASED_OUTPATIENT_CLINIC_OR_DEPARTMENT_OTHER): Payer: Medicare Other | Admitting: Lab

## 2012-05-23 ENCOUNTER — Encounter: Payer: Self-pay | Admitting: Oncology

## 2012-05-23 ENCOUNTER — Ambulatory Visit (HOSPITAL_BASED_OUTPATIENT_CLINIC_OR_DEPARTMENT_OTHER): Payer: Medicare Other | Admitting: Oncology

## 2012-05-23 ENCOUNTER — Telehealth: Payer: Self-pay | Admitting: Oncology

## 2012-05-23 VITALS — BP 151/79 | HR 108 | Temp 98.2°F | Resp 20 | Ht 64.0 in | Wt 185.7 lb

## 2012-05-23 DIAGNOSIS — C569 Malignant neoplasm of unspecified ovary: Secondary | ICD-10-CM

## 2012-05-23 DIAGNOSIS — T451X5A Adverse effect of antineoplastic and immunosuppressive drugs, initial encounter: Secondary | ICD-10-CM | POA: Insufficient documentation

## 2012-05-23 DIAGNOSIS — D6481 Anemia due to antineoplastic chemotherapy: Secondary | ICD-10-CM | POA: Insufficient documentation

## 2012-05-23 LAB — CBC WITH DIFFERENTIAL/PLATELET
Basophils Absolute: 0 10*3/uL (ref 0.0–0.1)
Eosinophils Absolute: 0.2 10*3/uL (ref 0.0–0.5)
HCT: 29.7 % — ABNORMAL LOW (ref 34.8–46.6)
HGB: 9.6 g/dL — ABNORMAL LOW (ref 11.6–15.9)
MONO#: 0.8 10*3/uL (ref 0.1–0.9)
NEUT%: 53.9 % (ref 38.4–76.8)
WBC: 7.5 10*3/uL (ref 3.9–10.3)
lymph#: 2.5 10*3/uL (ref 0.9–3.3)

## 2012-05-23 NOTE — Telephone Encounter (Signed)
Per staff message and POF I have scheduled appts.  JMW  

## 2012-05-23 NOTE — Patient Instructions (Signed)
We will try low dose aranesp shots weekly for about the next 4 weeks, to see if this helps your anemia.   For chemo on 05-24-12 at 2:00 PM, take decadron (dexamethasone, steroid) five tablets at 2:00AM on 05-24-12, which is 12 hours before taxol

## 2012-05-23 NOTE — Progress Notes (Signed)
OFFICE PROGRESS NOTE   05/23/2012   Physicians:W.Brewster, V.Bland, J.Mann, K.Reddy   INTERVAL HISTORY:   Patient is seen, together with daughter, in continuing attention to her progressive ovarian carcinoma, now on single agent taxol day 1 day 8 q 21 days since 03-21-12, for day 1 cycle 4 on 05-24-12. She has had no symptoms of recurrent bowel obstruction, is tolerating the chemotherapy very well, and has had some improvement in CA 125. She has PAC in. She has not needed gCSF.  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 She had optimal debulking by Dr.Brewster, 10 liters of ascites at that procedure. She was treated with 8 cycles of taxol/carboplatin thru Aug. 2011, with CA125 still ~ 40 at completion of the chemotherapy. The tumor was ER + at 100% and she was maintained on tamoxifen from Oct 2011 until Nov 2013. CA125 nadir was 20 in April 2012, with gradual rise subsequently, tho patient remained asymptomatic until SBO (hospitalized 11-13 thru 02-11-12 for SBO). CA 125 was 88 on 03-08-12 and was 53 on 05-10-12.   Patient has had no nausea, no significant peripheral neuropathy (left hand slightly numb after last Rx, now resolved), no significant fatigue, no abdominal or pelvic pain. She denies increased shortness of breath, chest pain or other pain, any bleeding. She has had no fever or symptoms of infection. Bowels are moving regularly, one episode of loose stool since last treatment. Remainder of 10 point Review of Systems negative.  Patient saw Dr Parke Simmers recently, some adjustment to her [place]/HCTZ. Dr Parke Simmers will see her on regular basis now.  Objective:  Vital signs in last 24 hours:  BP 151/79  Pulse 108  Temp(Src) 98.2 F (36.8 C) (Oral)  Resp 20  Ht 5\' 4"  (1.626 m)  Wt 185 lb 11.2 oz (84.233 kg)  BMI 31.86 kg/m2 Weight is up 3 lbs. Easily ambulatory, looks comfortable, respirations not  labored RA.    HEENT:PERRLA, sclera clear, anicteric and oropharynx clear, no lesions. Wearing wig. Poorly fitting dentures. LymphaticsCervical, supraclavicular, and axillary nodes normal. Resp: clear to auscultation bilaterally and normal percussion bilaterally Cardio: regular rate and rhythm GI: soft, somewhat less full, not tender, no clear fluid wave, no appreciable HSM or mass Extremities: extremities normal, atraumatic, no cyanosis or edema Neuro:no sensory deficits noted Skin without rash or ecchymosis Portacath-without erythema or tenderness  Lab Results:  Results for orders placed in visit on 05/23/12  CBC WITH DIFFERENTIAL      Result Value Range   WBC 7.5  3.9 - 10.3 10e3/uL   NEUT# 4.1  1.5 - 6.5 10e3/uL   HGB 9.6 (*) 11.6 - 15.9 g/dL   HCT 16.1 (*) 09.6 - 04.5 %   Platelets 262  145 - 400 10e3/uL   MCV 88.1  79.5 - 101.0 fL   MCH 28.5  25.1 - 34.0 pg   MCHC 32.3  31.5 - 36.0 g/dL   RBC 4.09 (*) 8.11 - 9.14 10e6/uL   RDW 16.5 (*) 11.2 - 14.5 %   lymph# 2.5  0.9 - 3.3 10e3/uL   MONO# 0.8  0.1 - 0.9 10e3/uL   Eosinophils Absolute 0.2  0.0 - 0.5 10e3/uL   Basophils Absolute 0.0  0.0 - 0.1 10e3/uL   NEUT% 53.9  38.4 - 76.8 %   LYMPH% 32.6  14.0 - 49.7 %   MONO% 10.5  0.0 - 14.0 %   EOS% 2.7  0.0 -  7.0 %   BASO% 0.3  0.0 - 2.0 %   nRBC 0  0 - 0 %   Hgb is down from 10.4 on 05-10-12. Iron studies in Jan 2014 with serum iron 49 and %sat 19 CA 125 on 05-10-12 was down to 53  Studies/Results:  No results found. With CA 125 improving and patient clinically doing well, I have not ordered repeat scans yet.  Medications: I have reviewed the patient's current medications. She continues ferrous gluconate, and uses decadron 20 mg 12 hrs prior to taxol.  We have discussed trying aranesp weekly for next 4 weeks for chemo anemia, as she has previously required PRBC transfusions while on chemotherapy (and family members are Jehovah's Witness, such that this caused significant  stress). She has been given information for the aranesp and can start this when she is at office for chemotherapy on 05-24-12, ordered for 3-4, 3-11, 3-18 and 06-14-12.  Assessment/Plan:  1. progression of recurrent ovarian carcinoma,  complicated by self-limited SBO in Nov 2013: will continue weekly taxol with cycle 4 on 3-4 and 05-31-12 as long as ANC >=1.5 and plt >=100k each day. She will have day 1 cycle 5 on 06-14-12 with cbc/cmet/ca125 that day, and I will see her with day 8 cycle 5 on 06-21-12. 2.PAC in  3.C diff negative early Dec  4.anemia: hgb decreased today, with anemia significant with prior chemo regimens. Trial of aranesp as above. 5.HTN : med adjustments per Dr Parke Simmers  6.history of anxiety/ depression for which she is followed by psychiatry  7.flu vaccine done   Patient and daughter were comfortable with plan above.  LIVESAY,LENNIS P, MD   05/23/2012, 3:14 PM

## 2012-05-24 ENCOUNTER — Ambulatory Visit (HOSPITAL_BASED_OUTPATIENT_CLINIC_OR_DEPARTMENT_OTHER): Payer: Medicare Other

## 2012-05-24 ENCOUNTER — Telehealth: Payer: Self-pay | Admitting: Oncology

## 2012-05-24 MED ORDER — SODIUM CHLORIDE 0.9 % IJ SOLN
10.0000 mL | INTRAMUSCULAR | Status: DC | PRN
  Administered 2012-05-24: 10 mL
  Filled 2012-05-24: qty 10

## 2012-05-24 MED ORDER — DIPHENHYDRAMINE HCL 50 MG/ML IJ SOLN
50.0000 mg | Freq: Once | INTRAMUSCULAR | Status: AC
  Administered 2012-05-24: 50 mg via INTRAVENOUS

## 2012-05-24 MED ORDER — PACLITAXEL CHEMO INJECTION 300 MG/50ML
80.0000 mg/m2 | Freq: Once | INTRAVENOUS | Status: AC
  Administered 2012-05-24: 156 mg via INTRAVENOUS
  Filled 2012-05-24: qty 26

## 2012-05-24 MED ORDER — DEXAMETHASONE SODIUM PHOSPHATE 4 MG/ML IJ SOLN
20.0000 mg | Freq: Once | INTRAMUSCULAR | Status: AC
  Administered 2012-05-24: 20 mg via INTRAVENOUS

## 2012-05-24 MED ORDER — FAMOTIDINE IN NACL 20-0.9 MG/50ML-% IV SOLN
20.0000 mg | Freq: Once | INTRAVENOUS | Status: AC
  Administered 2012-05-24: 20 mg via INTRAVENOUS

## 2012-05-24 MED ORDER — SODIUM CHLORIDE 0.9 % IV SOLN
Freq: Once | INTRAVENOUS | Status: AC
  Administered 2012-05-24: 14:00:00 via INTRAVENOUS

## 2012-05-24 MED ORDER — ONDANSETRON 8 MG/50ML IVPB (CHCC)
8.0000 mg | Freq: Once | INTRAVENOUS | Status: AC
Start: 2012-05-24 — End: 2012-05-24
  Administered 2012-05-24: 8 mg via INTRAVENOUS

## 2012-05-24 MED ORDER — HEPARIN SOD (PORK) LOCK FLUSH 100 UNIT/ML IV SOLN
500.0000 [IU] | Freq: Once | INTRAVENOUS | Status: AC | PRN
  Administered 2012-05-24: 500 [IU]
  Filled 2012-05-24: qty 5

## 2012-05-24 NOTE — Telephone Encounter (Signed)
, °

## 2012-05-24 NOTE — Patient Instructions (Addendum)
[facility] Discharge Instructions for Patients Receiving Chemotherapy  Today you received the following chemotherapy agents Taxol.  To help prevent nausea and vomiting after your treatment, we encourage you to take your nausea medication as prescribed.    If you develop nausea and vomiting that is not controlled by your nausea medication, call the clinic. If it is after clinic hours your family physician or the after hours number for the clinic or go to the Emergency Department.   BELOW ARE SYMPTOMS THAT SHOULD BE REPORTED IMMEDIATELY:  *FEVER GREATER THAN 100.5 F  *CHILLS WITH OR WITHOUT FEVER  NAUSEA AND VOMITING THAT IS NOT CONTROLLED WITH YOUR NAUSEA MEDICATION  *UNUSUAL SHORTNESS OF BREATH  *UNUSUAL BRUISING OR BLEEDING  TENDERNESS IN MOUTH AND THROAT WITH OR WITHOUT PRESENCE OF ULCERS  *URINARY PROBLEMS  *BOWEL PROBLEMS  UNUSUAL RASH Items with * indicate a potential emergency and should be followed up as soon as possible.   Please let the nurse know about any problems that you may have experienced. Feel free to call the clinic you have any questions or concerns. The clinic phone number is [phone].   I have been informed and understand all the instructions given to me. I know to contact the clinic, my physician, or go to the Emergency Department if any problems should occur. I do not have any questions at this time, but understand that I may call the clinic during office hours   should I have any questions or need assistance in obtaining follow up care.    __________________________________________  _____________  __________ Signature of Patient or Authorized Representative            Date                   Time    __________________________________________ Nurse's Signature

## 2012-05-27 NOTE — Progress Notes (Signed)
[facility] [employer] [facility]. treated Jaclyn [name] on 05-24-12.  Stacy told this nurse on 05-26-12 that Jaclyn [name] refused the aranesp injection.  She was overwhelmed by the information concerning side effects and wants to discuss with Dr. Darrold Span before she takes the aranesp. This note forwarded to Dr. Darrold Span.

## 2012-05-28 ENCOUNTER — Other Ambulatory Visit: Payer: Self-pay | Admitting: Oncology

## 2012-05-30 ENCOUNTER — Other Ambulatory Visit: Payer: Self-pay

## 2012-05-31 ENCOUNTER — Other Ambulatory Visit (HOSPITAL_BASED_OUTPATIENT_CLINIC_OR_DEPARTMENT_OTHER): Payer: Medicare Other | Admitting: Lab

## 2012-05-31 ENCOUNTER — Ambulatory Visit (HOSPITAL_BASED_OUTPATIENT_CLINIC_OR_DEPARTMENT_OTHER): Payer: Medicare Other

## 2012-05-31 VITALS — BP 146/72 | HR 91 | Temp 97.8°F | Resp 16

## 2012-05-31 DIAGNOSIS — C569 Malignant neoplasm of unspecified ovary: Secondary | ICD-10-CM

## 2012-05-31 LAB — CBC WITH DIFFERENTIAL/PLATELET
EOS%: 0 % (ref 0.0–7.0)
Eosinophils Absolute: 0 10*3/uL (ref 0.0–0.5)
MCV: 89.2 fL (ref 79.5–101.0)
MONO%: 1 % (ref 0.0–14.0)
NEUT#: 4.1 10*3/uL (ref 1.5–6.5)
RBC: 3.33 10*6/uL — ABNORMAL LOW (ref 3.70–5.45)
RDW: 16.4 % — ABNORMAL HIGH (ref 11.2–14.5)
lymph#: 0.9 10*3/uL (ref 0.9–3.3)
nRBC: 0 % (ref 0–0)

## 2012-05-31 LAB — COMPREHENSIVE METABOLIC PANEL (CC13)
ALT: 11 U/L (ref 0–55)
AST: 11 U/L (ref 5–34)
Chloride: 105 mEq/L (ref 98–107)
Creatinine: 0.9 mg/dL (ref 0.6–1.1)
Total Bilirubin: 0.26 mg/dL (ref 0.20–1.20)

## 2012-05-31 MED ORDER — FAMOTIDINE IN NACL 20-0.9 MG/50ML-% IV SOLN
20.0000 mg | Freq: Once | INTRAVENOUS | Status: AC
  Administered 2012-05-31: 20 mg via INTRAVENOUS

## 2012-05-31 MED ORDER — SODIUM CHLORIDE 0.9 % IV SOLN
Freq: Once | INTRAVENOUS | Status: AC
  Administered 2012-05-31: 15:00:00 via INTRAVENOUS

## 2012-05-31 MED ORDER — SODIUM CHLORIDE 0.9 % IV SOLN
80.0000 mg/m2 | Freq: Once | INTRAVENOUS | Status: AC
  Administered 2012-05-31: 156 mg via INTRAVENOUS
  Filled 2012-05-31: qty 26

## 2012-05-31 MED ORDER — DIPHENHYDRAMINE HCL 50 MG/ML IJ SOLN
50.0000 mg | Freq: Once | INTRAMUSCULAR | Status: AC
  Administered 2012-05-31: 50 mg via INTRAVENOUS

## 2012-05-31 MED ORDER — SODIUM CHLORIDE 0.9 % IJ SOLN
10.0000 mL | INTRAMUSCULAR | Status: DC | PRN
  Administered 2012-05-31: 10 mL
  Filled 2012-05-31: qty 10

## 2012-05-31 MED ORDER — DEXAMETHASONE SODIUM PHOSPHATE 4 MG/ML IJ SOLN
20.0000 mg | Freq: Once | INTRAMUSCULAR | Status: AC
  Administered 2012-05-31: 20 mg via INTRAVENOUS

## 2012-05-31 MED ORDER — ONDANSETRON 8 MG/50ML IVPB (CHCC)
8.0000 mg | Freq: Once | INTRAVENOUS | Status: AC
  Administered 2012-05-31: 8 mg via INTRAVENOUS

## 2012-05-31 MED ORDER — HEPARIN SOD (PORK) LOCK FLUSH 100 UNIT/ML IV SOLN
500.0000 [IU] | Freq: Once | INTRAVENOUS | Status: AC | PRN
  Administered 2012-05-31: 500 [IU]
  Filled 2012-05-31: qty 5

## 2012-05-31 NOTE — Progress Notes (Signed)
Discussed that daughter's are Jehovah's Witness but pt is not. Pt wants to receive blood whenever needed. Daughters are aware of this and respect mother's wishes, but this does cause distress between them. Pt reiterated she wants to talk with Dr Darrold Span before taking any aranesp. Spoke w/Louise RN about refusal of aranesp and Dr Darrold Span has been made aware previously.

## 2012-05-31 NOTE — Patient Instructions (Addendum)
[facility] Discharge Instructions for Patients Receiving Chemotherapy  Today you received the following chemotherapy agents taxol  To help prevent nausea and vomiting after your treatment as directed by MD   If you develop nausea and vomiting that is not controlled by your nausea medication, call the clinic. If it is after clinic hours your family physician or the after hours number for the clinic or go to the Emergency Department.   BELOW ARE SYMPTOMS THAT SHOULD BE REPORTED IMMEDIATELY:  *FEVER GREATER THAN 100.5 F  *CHILLS WITH OR WITHOUT FEVER  NAUSEA AND VOMITING THAT IS NOT CONTROLLED WITH YOUR NAUSEA MEDICATION  *UNUSUAL SHORTNESS OF BREATH  *UNUSUAL BRUISING OR BLEEDING  TENDERNESS IN MOUTH AND THROAT WITH OR WITHOUT PRESENCE OF ULCERS  *URINARY PROBLEMS  *BOWEL PROBLEMS  UNUSUAL RASH Items with * indicate a potential emergency and should be followed up as soon as possible.   Feel free to call the clinic you have any questions or concerns. The clinic phone number is [phone].   I have been informed and understand all the instructions given to me. I know to contact the clinic, my physician, or go to the Emergency Department if any problems should occur. I do not have any questions at this time, but understand that I may call the clinic during office hours   should I have any questions or need assistance in obtaining follow up care.    __________________________________________  _____________  __________ Signature of Patient or Authorized Representative            Date                   Time    __________________________________________ Nurse's Signature

## 2012-06-01 LAB — CA 125: CA 125: 66.4 U/mL — ABNORMAL HIGH (ref 0.0–30.2)

## 2012-06-02 ENCOUNTER — Telehealth: Payer: Self-pay | Admitting: *Deleted

## 2012-06-02 NOTE — Telephone Encounter (Signed)
Patient called to clarify that she is not a Jehovah's Witness, that is her daughters. She is [facility] and will receive blood if needed. She did read the [facility]anes[facility] information sheet and has decided against the shots at this time. She still would like to have the labs done to check counts. We will cancel injection appt for 3/18 and inform patient if any intervention is needed with labs on that day.

## 2012-06-07 ENCOUNTER — Ambulatory Visit: Payer: Medicare Other

## 2012-06-07 ENCOUNTER — Other Ambulatory Visit: Payer: Medicare Other | Admitting: Lab

## 2012-06-07 ENCOUNTER — Other Ambulatory Visit (HOSPITAL_COMMUNITY): Payer: Self-pay | Admitting: Internal Medicine

## 2012-06-07 DIAGNOSIS — R0600 Dyspnea, unspecified: Secondary | ICD-10-CM

## 2012-06-14 ENCOUNTER — Ambulatory Visit (HOSPITAL_BASED_OUTPATIENT_CLINIC_OR_DEPARTMENT_OTHER): Payer: Medicare Other

## 2012-06-14 ENCOUNTER — Other Ambulatory Visit (HOSPITAL_BASED_OUTPATIENT_CLINIC_OR_DEPARTMENT_OTHER): Payer: Medicare Other | Admitting: Lab

## 2012-06-14 VITALS — BP 167/74 | HR 99 | Temp 97.7°F | Resp 20

## 2012-06-14 DIAGNOSIS — Z5111 Encounter for antineoplastic chemotherapy: Secondary | ICD-10-CM

## 2012-06-14 DIAGNOSIS — C569 Malignant neoplasm of unspecified ovary: Secondary | ICD-10-CM

## 2012-06-14 LAB — CBC WITH DIFFERENTIAL/PLATELET
BASO%: 0.1 % (ref 0.0–2.0)
EOS%: 0.1 % (ref 0.0–7.0)
HCT: 31.9 % — ABNORMAL LOW (ref 34.8–46.6)
LYMPH%: 17.9 % (ref 14.0–49.7)
MCH: 28.8 pg (ref 25.1–34.0)
MCHC: 32.3 g/dL (ref 31.5–36.0)
MONO#: 0.1 10*3/uL (ref 0.1–0.9)
MONO%: 1.3 % (ref 0.0–14.0)
NEUT%: 80.6 % — ABNORMAL HIGH (ref 38.4–76.8)
Platelets: 276 10*3/uL (ref 145–400)
RBC: 3.58 10*6/uL — ABNORMAL LOW (ref 3.70–5.45)
WBC: 8.5 10*3/uL (ref 3.9–10.3)
nRBC: 0 % (ref 0–0)

## 2012-06-14 LAB — COMPREHENSIVE METABOLIC PANEL (CC13)
AST: 12 U/L (ref 5–34)
Albumin: 3.4 g/dL — ABNORMAL LOW (ref 3.5–5.0)
BUN: 17.6 mg/dL (ref 7.0–26.0)
Calcium: 9.7 mg/dL (ref 8.4–10.4)
Chloride: 107 mEq/L (ref 98–107)
Glucose: 195 mg/dl — ABNORMAL HIGH (ref 70–99)
Potassium: 4.5 mEq/L (ref 3.5–5.1)
Sodium: 139 mEq/L (ref 136–145)
Total Protein: 7.5 g/dL (ref 6.4–8.3)

## 2012-06-14 MED ORDER — SODIUM CHLORIDE 0.9 % IJ SOLN
10.0000 mL | INTRAMUSCULAR | Status: DC | PRN
  Administered 2012-06-14: 10 mL
  Filled 2012-06-14: qty 10

## 2012-06-14 MED ORDER — SODIUM CHLORIDE 0.9 % IV SOLN
Freq: Once | INTRAVENOUS | Status: AC
  Administered 2012-06-14: 11:00:00 via INTRAVENOUS

## 2012-06-14 MED ORDER — HEPARIN SOD (PORK) LOCK FLUSH 100 UNIT/ML IV SOLN
500.0000 [IU] | Freq: Once | INTRAVENOUS | Status: AC | PRN
  Administered 2012-06-14: 500 [IU]
  Filled 2012-06-14: qty 5

## 2012-06-14 MED ORDER — ONDANSETRON 8 MG/50ML IVPB (CHCC)
8.0000 mg | Freq: Once | INTRAVENOUS | Status: AC
  Administered 2012-06-14: 8 mg via INTRAVENOUS

## 2012-06-14 MED ORDER — DEXAMETHASONE SODIUM PHOSPHATE 4 MG/ML IJ SOLN
20.0000 mg | Freq: Once | INTRAMUSCULAR | Status: AC
Start: 2012-06-14 — End: 2012-06-14
  Administered 2012-06-14: 20 mg via INTRAVENOUS

## 2012-06-14 MED ORDER — SODIUM CHLORIDE 0.9 % IV SOLN
80.0000 mg/m2 | Freq: Once | INTRAVENOUS | Status: AC
  Administered 2012-06-14: 156 mg via INTRAVENOUS
  Filled 2012-06-14: qty 26

## 2012-06-14 MED ORDER — FAMOTIDINE IN NACL 20-0.9 MG/50ML-% IV SOLN
20.0000 mg | Freq: Once | INTRAVENOUS | Status: AC
  Administered 2012-06-14: 20 mg via INTRAVENOUS

## 2012-06-14 MED ORDER — DIPHENHYDRAMINE HCL 50 MG/ML IJ SOLN
50.0000 mg | Freq: Once | INTRAMUSCULAR | Status: AC
Start: 2012-06-14 — End: 2012-06-14
  Administered 2012-06-14: 50 mg via INTRAVENOUS

## 2012-06-14 NOTE — Patient Instructions (Addendum)
[facility] Discharge Instructions for Patients Receiving Chemotherapy  Today you received the following chemotherapy agents Taxol.  To help prevent nausea and vomiting after your treatment, we encourage you to take your nausea medication.   If you develop nausea and vomiting that is not controlled by your nausea medication, call the clinic. If it is after clinic hours your family physician or the after hours number for the clinic or go to the Emergency Department.   BELOW ARE SYMPTOMS THAT SHOULD BE REPORTED IMMEDIATELY:  *FEVER GREATER THAN 100.5 F  *CHILLS WITH OR WITHOUT FEVER  NAUSEA AND VOMITING THAT IS NOT CONTROLLED WITH YOUR NAUSEA MEDICATION  *UNUSUAL SHORTNESS OF BREATH  *UNUSUAL BRUISING OR BLEEDING  TENDERNESS IN MOUTH AND THROAT WITH OR WITHOUT PRESENCE OF ULCERS  *URINARY PROBLEMS  *BOWEL PROBLEMS  UNUSUAL RASH Items with * indicate a potential emergency and should be followed up as soon as possible.  One of the nurses will contact you 24 hours after your treatment. Please let the nurse know about any problems that you may have experienced. Feel free to call the clinic you have any questions or concerns. The clinic phone number is [phone].   I have been informed and understand all the instructions given to me. I know to contact the clinic, my physician, or go to the Emergency Department if any problems should occur. I do not have any questions at this time, but understand that I may call the clinic during office hours   should I have any questions or need assistance in obtaining follow up care.    __________________________________________  _____________  __________ Signature of Patient or Authorized Representative            Date                   Time    __________________________________________ Nurse's Signature    

## 2012-06-15 ENCOUNTER — Ambulatory Visit (HOSPITAL_COMMUNITY)
Admission: RE | Admit: 2012-06-15 | Discharge: 2012-06-15 | Disposition: A | Discharge status: Home / Self Care | Payer: Medicare Other | Source: Ambulatory Visit | Attending: Internal Medicine | Admitting: Internal Medicine

## 2012-06-15 DIAGNOSIS — E785 Hyperlipidemia, unspecified: Secondary | ICD-10-CM | POA: Insufficient documentation

## 2012-06-15 DIAGNOSIS — I079 Rheumatic tricuspid valve disease, unspecified: Secondary | ICD-10-CM | POA: Insufficient documentation

## 2012-06-15 DIAGNOSIS — R0609 Other forms of dyspnea: Secondary | ICD-10-CM | POA: Insufficient documentation

## 2012-06-15 DIAGNOSIS — I519 Heart disease, unspecified: Secondary | ICD-10-CM | POA: Insufficient documentation

## 2012-06-15 DIAGNOSIS — R0989 Other specified symptoms and signs involving the circulatory and respiratory systems: Secondary | ICD-10-CM | POA: Insufficient documentation

## 2012-06-15 DIAGNOSIS — I517 Cardiomegaly: Secondary | ICD-10-CM | POA: Insufficient documentation

## 2012-06-15 DIAGNOSIS — I059 Rheumatic mitral valve disease, unspecified: Secondary | ICD-10-CM | POA: Insufficient documentation

## 2012-06-15 DIAGNOSIS — I10 Essential (primary) hypertension: Secondary | ICD-10-CM | POA: Insufficient documentation

## 2012-06-15 DIAGNOSIS — R0600 Dyspnea, unspecified: Secondary | ICD-10-CM

## 2012-06-15 DIAGNOSIS — E119 Type 2 diabetes mellitus without complications: Secondary | ICD-10-CM | POA: Insufficient documentation

## 2012-06-15 NOTE — Progress Notes (Signed)
[facility]   2D echo completed 06/15/2012.   Cindy Danaja Lasota, RDCS  

## 2012-06-21 ENCOUNTER — Ambulatory Visit (HOSPITAL_BASED_OUTPATIENT_CLINIC_OR_DEPARTMENT_OTHER): Payer: Medicare Other

## 2012-06-21 ENCOUNTER — Telehealth: Payer: Self-pay | Admitting: Oncology

## 2012-06-21 ENCOUNTER — Encounter: Payer: Self-pay | Admitting: Oncology

## 2012-06-21 ENCOUNTER — Other Ambulatory Visit (HOSPITAL_BASED_OUTPATIENT_CLINIC_OR_DEPARTMENT_OTHER): Payer: Medicare Other | Admitting: Lab

## 2012-06-21 ENCOUNTER — Ambulatory Visit (HOSPITAL_BASED_OUTPATIENT_CLINIC_OR_DEPARTMENT_OTHER): Payer: Medicare Other | Admitting: Oncology

## 2012-06-21 ENCOUNTER — Telehealth: Payer: Self-pay | Admitting: *Deleted

## 2012-06-21 ENCOUNTER — Other Ambulatory Visit: Payer: Self-pay

## 2012-06-21 VITALS — BP 118/70 | HR 99 | Temp 96.6°F | Resp 20 | Ht 64.0 in | Wt 184.7 lb

## 2012-06-21 DIAGNOSIS — D649 Anemia, unspecified: Secondary | ICD-10-CM

## 2012-06-21 DIAGNOSIS — I10 Essential (primary) hypertension: Secondary | ICD-10-CM

## 2012-06-21 DIAGNOSIS — C569 Malignant neoplasm of unspecified ovary: Secondary | ICD-10-CM

## 2012-06-21 DIAGNOSIS — Z5111 Encounter for antineoplastic chemotherapy: Secondary | ICD-10-CM

## 2012-06-21 LAB — CBC WITH DIFFERENTIAL/PLATELET
BASO%: 0.2 % (ref 0.0–2.0)
EOS%: 0 % (ref 0.0–7.0)
Eosinophils Absolute: 0 10*3/uL (ref 0.0–0.5)
MCH: 28.5 pg (ref 25.1–34.0)
MCHC: 32.4 g/dL (ref 31.5–36.0)
MCV: 87.8 fL (ref 79.5–101.0)
MONO%: 0.6 % (ref 0.0–14.0)
NEUT#: 5 10*3/uL (ref 1.5–6.5)
RBC: 3.62 10*6/uL — ABNORMAL LOW (ref 3.70–5.45)
RDW: 16.8 % — ABNORMAL HIGH (ref 11.2–14.5)
nRBC: 0 % (ref 0–0)

## 2012-06-21 MED ORDER — SODIUM CHLORIDE 0.9 % IJ SOLN
10.0000 mL | INTRAMUSCULAR | Status: DC | PRN
  Administered 2012-06-21: 10 mL
  Filled 2012-06-21: qty 10

## 2012-06-21 MED ORDER — DIPHENHYDRAMINE HCL 50 MG/ML IJ SOLN
50.0000 mg | Freq: Once | INTRAMUSCULAR | Status: AC
  Administered 2012-06-21: 50 mg via INTRAVENOUS

## 2012-06-21 MED ORDER — SODIUM CHLORIDE 0.9 % IV SOLN
Freq: Once | INTRAVENOUS | Status: AC
  Administered 2012-06-21: 12:00:00 via INTRAVENOUS

## 2012-06-21 MED ORDER — HEPARIN SOD (PORK) LOCK FLUSH 100 UNIT/ML IV SOLN
500.0000 [IU] | Freq: Once | INTRAVENOUS | Status: AC | PRN
  Administered 2012-06-21: 500 [IU]
  Filled 2012-06-21: qty 5

## 2012-06-21 MED ORDER — LIDOCAINE-PRILOCAINE 2.5-2.5 % EX KIT: 1.0000 "application " | PACK | CUTANEOUS | Status: DC | PRN

## 2012-06-21 MED ORDER — DEXAMETHASONE 4 MG PO TABS: ORAL_TABLET | ORAL | Status: DC

## 2012-06-21 MED ORDER — DEXAMETHASONE SODIUM PHOSPHATE 4 MG/ML IJ SOLN
20.0000 mg | Freq: Once | INTRAMUSCULAR | Status: AC
  Administered 2012-06-21: 20 mg via INTRAVENOUS

## 2012-06-21 MED ORDER — FAMOTIDINE IN NACL 20-0.9 MG/50ML-% IV SOLN
20.0000 mg | Freq: Once | INTRAVENOUS | Status: AC
  Administered 2012-06-21: 20 mg via INTRAVENOUS

## 2012-06-21 MED ORDER — ONDANSETRON 8 MG/50ML IVPB (CHCC)
8.0000 mg | Freq: Once | INTRAVENOUS | Status: AC
  Administered 2012-06-21: 8 mg via INTRAVENOUS

## 2012-06-21 MED ORDER — SODIUM CHLORIDE 0.9 % IV SOLN
80.0000 mg/m2 | Freq: Once | INTRAVENOUS | Status: AC
  Administered 2012-06-21: 156 mg via INTRAVENOUS
  Filled 2012-06-21: qty 26

## 2012-06-21 NOTE — Patient Instructions (Addendum)
[facility] Discharge Instructions for Patients Receiving Chemotherapy  Today you received the following chemotherapy agents Taxol.  To help prevent nausea and vomiting after your treatment, we encourage you to take your nausea medication.   If you develop nausea and vomiting that is not controlled by your nausea medication, call the clinic. If it is after clinic hours your family physician or the after hours number for the clinic or go to the Emergency Department.   BELOW ARE SYMPTOMS THAT SHOULD BE REPORTED IMMEDIATELY:  *FEVER GREATER THAN 100.5 F  *CHILLS WITH OR WITHOUT FEVER  NAUSEA AND VOMITING THAT IS NOT CONTROLLED WITH YOUR NAUSEA MEDICATION  *UNUSUAL SHORTNESS OF BREATH  *UNUSUAL BRUISING OR BLEEDING  TENDERNESS IN MOUTH AND THROAT WITH OR WITHOUT PRESENCE OF ULCERS  *URINARY PROBLEMS  *BOWEL PROBLEMS  UNUSUAL RASH Items with * indicate a potential emergency and should be followed up as soon as possible.  One of the nurses will contact you 24 hours after your treatment. Please let the nurse know about any problems that you may have experienced. Feel free to call the clinic you have any questions or concerns. The clinic phone number is [phone].   I have been informed and understand all the instructions given to me. I know to contact the clinic, my physician, or go to the Emergency Department if any problems should occur. I do not have any questions at this time, but understand that I may call the clinic during office hours   should I have any questions or need assistance in obtaining follow up care.    __________________________________________  _____________  __________ Signature of Patient or Authorized Representative            Date                   Time    __________________________________________ Nurse's Signature    

## 2012-06-21 NOTE — Telephone Encounter (Signed)
Per staff phone call and POF I have schedueld appts.  JMW  

## 2012-06-21 NOTE — Progress Notes (Signed)
OFFICE PROGRESS NOTE   06/21/2012   Physicians:W.Brewster, V.Bland, J.Mann, K.Reddy   INTERVAL HISTORY:  Patient is seen, together with daughter, in continuing attention to her progressive ovarian cancer, on single agent taxol since 03-21-13 which she is tolerating well on day 1 day 8 q 21 day schedule. She has PAC in and has not needed gCSF. She has had no recurrence of bowel obstruction since Nov 2013. She declined epogen after reading consent information.  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 She had optimal debulking by Dr.Brewster, 10 liters of ascites at that procedure. She was treated with 8 cycles of taxol/carboplatin thru Aug. 2011, with CA125 still ~ 40 at completion of the chemotherapy. The tumor was ER + at 100% and she was maintained on tamoxifen from Oct 2011 until Nov 2013. CA125 nadir was 20 in April 2012, with gradual rise subsequently, tho patient remained asymptomatic until SBO (hospitalized 11-13 thru 02-11-12 for SBO). CA 125 was 88 on 03-08-12, 53 in Feb and 70 in late March.  .Patient has been feeling well, without abdominal or pelvic discomfort. No nausea/ vomiting, bowels moving almost daily, good appetite, good energy, no SOB, no fever or other symptoms of infection, bladder ok, no LE swelling. 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 Remainder of 10 point Review of Systems negative.  She had regular follow up with Dr Rennis Golden, including echocardiogram on 06-14-12; she has not heard back from his office re results, however report in EMR is of EF 65%.  Objective:  Vital signs in last 24 hours:  BP 118/70  Pulse 99  Temp(Src) 96.6 F (35.9 C) (Oral)  Resp 20  Ht 5\' 4"  (1.626 m)  Wt 184 lb 11.2 oz (83.779 kg)  BMI 31.69 kg/m2  Weight is down 1 lb. Easily mobile, looks comfortable, in good spirits, daughter very supportive  HEENT:PERRLA, sclera  clear, anicteric and oropharynx clear, no lesions  Wearing wig LymphaticsCervical, supraclavicular, and axillary nodes normal. No inguinal adenopathy appreciated Resp: clear to auscultation bilaterally and normal percussion bilaterally Cardio: regular rate and rhythm GI: soft, non-tender; bowel sounds normal; no masses,  no organomegaly  Obese Extremities: extremities normal, atraumatic, no cyanosis or edema Neuro:as above Skin not remarkable Portacath-without erythema or tenderness  Lab Results:  Results for orders placed in visit on 06/21/12  CBC WITH DIFFERENTIAL      Result Value Range   WBC 6.2  3.9 - 10.3 10e3/uL   NEUT# 5.0  1.5 - 6.5 10e3/uL   HGB 10.3 (*) 11.6 - 15.9 g/dL   HCT 30.8 (*) 65.7 - 84.6 %   Platelets 296  145 - 400 10e3/uL   MCV 87.8  79.5 - 101.0 fL   MCH 28.5  25.1 - 34.0 pg   MCHC 32.4  31.5 - 36.0 g/dL   RBC 9.62 (*) 9.52 - 8.41 10e6/uL   RDW 16.8 (*) 11.2 - 14.5 %   lymph# 1.1  0.9 - 3.3 10e3/uL   MONO# 0.0 (*) 0.1 - 0.9 10e3/uL   Eosinophils Absolute 0.0  0.0 - 0.5 10e3/uL   Basophils Absolute 0.0  0.0 - 0.1 10e3/uL   NEUT% 81.2 (*) 38.4 - 76.8 %   LYMPH% 18.0  14.0 - 49.7 %   MONO% 0.6  0.0 - 14.0 %   EOS% 0.0  0.0 - 7.0 %   BASO% 0.2  0.0 - 2.0 %  nRBC 0  0 - 0 %     Studies/Results:  No results found.  Medications: I have reviewed the patient's current medications.  Patient and daughter are in agreement with another cycle of the taxol, which will be #6, then repeat scans.  Assessment/Plan:  1. progression of recurrent ovarian carcinoma, complicated by self-limited SBO in Nov 2013: Clinically doing well despite CA 125 not improving further. will continue weekly taxol with day 8 cycle 5 today, then cycle 6 on 4-15 and 4-22 as long as ANC >=1.5 and plt >=100k each day. She premeds taxol with decadron 20 mg 12 hrs prior. She will have CT AP in May prior to my next visit. She is not presently scheduled back to Dr Nelly Rout. Note she had long  stable disease on tamoxifen previously, such that AI might be consideration after chemo.  2.PAC in  3.C diff negative early Dec  4.anemia: hgb stable today. Declined epogen.  5.HTN : recent cardiac reevaluation by Dr Rennis Golden as above. 6.history of anxiety/ depression for which she is followed by psychiatry   Time spent including >50% discussion and coordination of care 25 min.   LIVESAY,LENNIS P, MD   06/21/2012, 10:52 AM

## 2012-06-21 NOTE — Telephone Encounter (Signed)
, °

## 2012-07-05 ENCOUNTER — Other Ambulatory Visit (HOSPITAL_BASED_OUTPATIENT_CLINIC_OR_DEPARTMENT_OTHER): Payer: Medicare Other | Admitting: Lab

## 2012-07-05 ENCOUNTER — Ambulatory Visit (HOSPITAL_BASED_OUTPATIENT_CLINIC_OR_DEPARTMENT_OTHER): Payer: Medicare Other

## 2012-07-05 VITALS — BP 163/83 | HR 97 | Temp 98.3°F

## 2012-07-05 DIAGNOSIS — C569 Malignant neoplasm of unspecified ovary: Secondary | ICD-10-CM

## 2012-07-05 DIAGNOSIS — Z5111 Encounter for antineoplastic chemotherapy: Secondary | ICD-10-CM

## 2012-07-05 LAB — CBC WITH DIFFERENTIAL/PLATELET
BASO%: 0.1 % (ref 0.0–2.0)
EOS%: 0 % (ref 0.0–7.0)
MCH: 28.9 pg (ref 25.1–34.0)
MCHC: 32.5 g/dL (ref 31.5–36.0)
MONO#: 0.1 10*3/uL (ref 0.1–0.9)
RBC: 3.56 10*6/uL — ABNORMAL LOW (ref 3.70–5.45)
WBC: 7.7 10*3/uL (ref 3.9–10.3)
lymph#: 1.4 10*3/uL (ref 0.9–3.3)
nRBC: 0 % (ref 0–0)

## 2012-07-05 MED ORDER — SODIUM CHLORIDE 0.9 % IV SOLN
Freq: Once | INTRAVENOUS | Status: AC
  Administered 2012-07-05: 12:00:00 via INTRAVENOUS

## 2012-07-05 MED ORDER — SODIUM CHLORIDE 0.9 % IJ SOLN
10.0000 mL | INTRAMUSCULAR | Status: DC | PRN
  Administered 2012-07-05: 10 mL
  Filled 2012-07-05: qty 10

## 2012-07-05 MED ORDER — PACLITAXEL CHEMO INJECTION 300 MG/50ML
80.0000 mg/m2 | Freq: Once | INTRAVENOUS | Status: AC
  Administered 2012-07-05: 156 mg via INTRAVENOUS
  Filled 2012-07-05: qty 26

## 2012-07-05 MED ORDER — FAMOTIDINE IN NACL 20-0.9 MG/50ML-% IV SOLN
20.0000 mg | Freq: Once | INTRAVENOUS | Status: AC
  Administered 2012-07-05: 20 mg via INTRAVENOUS

## 2012-07-05 MED ORDER — HEPARIN SOD (PORK) LOCK FLUSH 100 UNIT/ML IV SOLN
500.0000 [IU] | Freq: Once | INTRAVENOUS | Status: AC | PRN
  Administered 2012-07-05: 500 [IU]
  Filled 2012-07-05: qty 5

## 2012-07-05 MED ORDER — DEXAMETHASONE SODIUM PHOSPHATE 4 MG/ML IJ SOLN
20.0000 mg | Freq: Once | INTRAMUSCULAR | Status: AC
  Administered 2012-07-05: 20 mg via INTRAVENOUS

## 2012-07-05 MED ORDER — ONDANSETRON 8 MG/50ML IVPB (CHCC)
8.0000 mg | Freq: Once | INTRAVENOUS | Status: AC
  Administered 2012-07-05: 8 mg via INTRAVENOUS

## 2012-07-05 MED ORDER — DIPHENHYDRAMINE HCL 50 MG/ML IJ SOLN
50.0000 mg | Freq: Once | INTRAMUSCULAR | Status: AC
  Administered 2012-07-05: 50 mg via INTRAVENOUS

## 2012-07-05 NOTE — Patient Instructions (Addendum)
[facility] Discharge Instructions for Patients Receiving Chemotherapy  Today you received the following chemotherapy agents taxol  To help prevent nausea and vomiting after your treatment, we encourage you to take your nausea medication as needed   If you develop nausea and vomiting that is not controlled by your nausea medication, call the clinic. If it is after clinic hours your family physician or the after hours number for the clinic or go to the Emergency Department.   BELOW ARE SYMPTOMS THAT SHOULD BE REPORTED IMMEDIATELY:  *FEVER GREATER THAN 100.5 F  *CHILLS WITH OR WITHOUT FEVER  NAUSEA AND VOMITING THAT IS NOT CONTROLLED WITH YOUR NAUSEA MEDICATION  *UNUSUAL SHORTNESS OF BREATH  *UNUSUAL BRUISING OR BLEEDING  TENDERNESS IN MOUTH AND THROAT WITH OR WITHOUT PRESENCE OF ULCERS  *URINARY PROBLEMS  *BOWEL PROBLEMS  UNUSUAL RASH Items with * indicate a potential emergency and should be followed up as soon as possible.  The clinic phone number is [phone].   I have been informed and understand all the instructions given to me. I know to contact the clinic, my physician, or go to the Emergency Department if any problems should occur. I do not have any questions at this time, but understand that I may call the clinic during office hours   should I have any questions or need assistance in obtaining follow up care.    __________________________________________  _____________  __________ Signature of Patient or Authorized Representative            Date                   Time    __________________________________________ Nurse's Signature

## 2012-07-11 ENCOUNTER — Encounter: Payer: Self-pay | Admitting: Lab

## 2012-07-12 ENCOUNTER — Other Ambulatory Visit (HOSPITAL_BASED_OUTPATIENT_CLINIC_OR_DEPARTMENT_OTHER): Payer: Medicare Other | Admitting: Lab

## 2012-07-12 ENCOUNTER — Ambulatory Visit (HOSPITAL_BASED_OUTPATIENT_CLINIC_OR_DEPARTMENT_OTHER): Payer: Medicare Other

## 2012-07-12 VITALS — BP 149/70 | HR 89 | Temp 97.5°F

## 2012-07-12 DIAGNOSIS — C569 Malignant neoplasm of unspecified ovary: Secondary | ICD-10-CM

## 2012-07-12 DIAGNOSIS — Z5111 Encounter for antineoplastic chemotherapy: Secondary | ICD-10-CM

## 2012-07-12 DIAGNOSIS — D649 Anemia, unspecified: Secondary | ICD-10-CM

## 2012-07-12 LAB — CBC & DIFF AND RETIC
Basophils Absolute: 0 10*3/uL (ref 0.0–0.1)
EOS%: 0 % (ref 0.0–7.0)
Eosinophils Absolute: 0 10*3/uL (ref 0.0–0.5)
HGB: 9.9 g/dL — ABNORMAL LOW (ref 11.6–15.9)
MCH: 29 pg (ref 25.1–34.0)
NEUT#: 5.4 10*3/uL (ref 1.5–6.5)
RBC: 3.41 10*6/uL — ABNORMAL LOW (ref 3.70–5.45)
RDW: 16.4 % — ABNORMAL HIGH (ref 11.2–14.5)
Retic %: 1.04 % (ref 0.70–2.10)
Retic Ct Abs: 35.46 10*3/uL (ref 33.70–90.70)
lymph#: 1.1 10*3/uL (ref 0.9–3.3)
nRBC: 0 % (ref 0–0)

## 2012-07-12 LAB — COMPREHENSIVE METABOLIC PANEL (CC13)
Alkaline Phosphatase: 72 U/L (ref 40–150)
CO2: 20 mEq/L — ABNORMAL LOW (ref 22–29)
Creatinine: 1.1 mg/dL (ref 0.6–1.1)
Glucose: 207 mg/dl — ABNORMAL HIGH (ref 70–99)
Sodium: 137 mEq/L (ref 136–145)
Total Bilirubin: 0.28 mg/dL (ref 0.20–1.20)
Total Protein: 7.4 g/dL (ref 6.4–8.3)

## 2012-07-12 MED ORDER — DEXAMETHASONE SODIUM PHOSPHATE 4 MG/ML IJ SOLN
20.0000 mg | Freq: Once | INTRAMUSCULAR | Status: AC
  Administered 2012-07-12: 20 mg via INTRAVENOUS

## 2012-07-12 MED ORDER — ONDANSETRON 8 MG/50ML IVPB (CHCC)
8.0000 mg | Freq: Once | INTRAVENOUS | Status: AC
  Administered 2012-07-12: 8 mg via INTRAVENOUS

## 2012-07-12 MED ORDER — SODIUM CHLORIDE 0.9 % IJ SOLN
10.0000 mL | INTRAMUSCULAR | Status: DC | PRN
  Administered 2012-07-12: 10 mL
  Filled 2012-07-12: qty 10

## 2012-07-12 MED ORDER — HEPARIN SOD (PORK) LOCK FLUSH 100 UNIT/ML IV SOLN
500.0000 [IU] | Freq: Once | INTRAVENOUS | Status: AC | PRN
  Administered 2012-07-12: 500 [IU]
  Filled 2012-07-12: qty 5

## 2012-07-12 MED ORDER — SODIUM CHLORIDE 0.9 % IV SOLN
80.0000 mg/m2 | Freq: Once | INTRAVENOUS | Status: AC
  Administered 2012-07-12: 156 mg via INTRAVENOUS
  Filled 2012-07-12: qty 26

## 2012-07-12 MED ORDER — FAMOTIDINE IN NACL 20-0.9 MG/50ML-% IV SOLN
20.0000 mg | Freq: Once | INTRAVENOUS | Status: AC
  Administered 2012-07-12: 20 mg via INTRAVENOUS

## 2012-07-12 MED ORDER — SODIUM CHLORIDE 0.9 % IV SOLN
Freq: Once | INTRAVENOUS | Status: AC
  Administered 2012-07-12: 12:00:00 via INTRAVENOUS

## 2012-07-12 MED ORDER — DIPHENHYDRAMINE HCL 50 MG/ML IJ SOLN
50.0000 mg | Freq: Once | INTRAMUSCULAR | Status: AC
  Administered 2012-07-12: 50 mg via INTRAVENOUS

## 2012-07-12 NOTE — Patient Instructions (Addendum)
[facility] Discharge Instructions for Patients Receiving Chemotherapy  Today you received the following chemotherapy agents :  Taxol To help prevent nausea and vomiting after your treatment, we encourage you to take your nausea medication  If you develop nausea and vomiting that is not controlled by your nausea medication, call the clinic. If it is after clinic hours your family physician or the after hours number for the clinic or go to the Emergency Department.   BELOW ARE SYMPTOMS THAT SHOULD BE REPORTED IMMEDIATELY:  *FEVER GREATER THAN 100.5 F  *CHILLS WITH OR WITHOUT FEVER  NAUSEA AND VOMITING THAT IS NOT CONTROLLED WITH YOUR NAUSEA MEDICATION  *UNUSUAL SHORTNESS OF BREATH  *UNUSUAL BRUISING OR BLEEDING  TENDERNESS IN MOUTH AND THROAT WITH OR WITHOUT PRESENCE OF ULCERS  *URINARY PROBLEMS  *BOWEL PROBLEMS  UNUSUAL RASH Items with * indicate a potential emergency and should be followed up as soon as possible.   Please let the nurse know about any problems that you may have experienced. Feel free to call the clinic you have any questions or concerns. The clinic phone number is [phone].   I have been informed and understand all the instructions given to me. I know to contact the clinic, my physician, or go to the Emergency Department if any problems should occur. I do not have any questions at this time, but understand that I may call the clinic during office hours   should I have any questions or need assistance in obtaining follow up care.    __________________________________________  _____________  __________ Signature of Patient or Authorized Representative            Date                   Time    __________________________________________ Nurse's Signature

## 2012-07-14 ENCOUNTER — Telehealth: Payer: Self-pay | Admitting: *Deleted

## 2012-07-14 NOTE — Telephone Encounter (Signed)
No answer

## 2012-07-14 NOTE — Telephone Encounter (Signed)
Message copied by Carola Rhine A on Thu Jul 14, 2012 10:20 AM ------      Message from: Jama Flavors P      Created: Wed Jul 13, 2012  1:02 PM       Labs seen and need follow up: please let her know marker was a little better again, at 50            Cc LA, TH ------

## 2012-07-14 NOTE — Telephone Encounter (Signed)
Message copied by Carola Rhine A on Thu Jul 14, 2012  1:28 PM ------      Message from: Jama Flavors P      Created: Wed Jul 13, 2012  1:02 PM       Labs seen and need follow up: please let her know marker was a little better again, at 50            Cc LA, TH ------

## 2012-07-14 NOTE — Telephone Encounter (Signed)
Pt notified of results below 

## 2012-07-22 ENCOUNTER — Encounter: Payer: Self-pay | Admitting: *Deleted

## 2012-07-26 ENCOUNTER — Ambulatory Visit (HOSPITAL_COMMUNITY)
Admission: RE | Admit: 2012-07-26 | Discharge: 2012-07-26 | Disposition: A | Discharge status: Home / Self Care | Payer: Medicare Other | Source: Ambulatory Visit | Attending: Oncology | Admitting: Oncology

## 2012-07-26 DIAGNOSIS — Z9089 Acquired absence of other organs: Secondary | ICD-10-CM | POA: Insufficient documentation

## 2012-07-26 DIAGNOSIS — Z9221 Personal history of antineoplastic chemotherapy: Secondary | ICD-10-CM | POA: Insufficient documentation

## 2012-07-26 DIAGNOSIS — I889 Nonspecific lymphadenitis, unspecified: Secondary | ICD-10-CM | POA: Insufficient documentation

## 2012-07-26 DIAGNOSIS — C569 Malignant neoplasm of unspecified ovary: Secondary | ICD-10-CM

## 2012-07-26 DIAGNOSIS — M479 Spondylosis, unspecified: Secondary | ICD-10-CM | POA: Insufficient documentation

## 2012-07-26 DIAGNOSIS — K7689 Other specified diseases of liver: Secondary | ICD-10-CM | POA: Insufficient documentation

## 2012-07-26 MED ORDER — IOHEXOL 300 MG/ML  SOLN
100.0000 mL | Freq: Once | INTRAMUSCULAR | Status: AC | PRN
  Administered 2012-07-26: 100 mL via INTRAVENOUS

## 2012-08-02 ENCOUNTER — Telehealth: Payer: Self-pay | Admitting: *Deleted

## 2012-08-02 ENCOUNTER — Telehealth: Payer: Self-pay | Admitting: Oncology

## 2012-08-02 ENCOUNTER — Telehealth: Payer: Self-pay

## 2012-08-02 ENCOUNTER — Other Ambulatory Visit: Payer: Self-pay

## 2012-08-02 ENCOUNTER — Other Ambulatory Visit (HOSPITAL_BASED_OUTPATIENT_CLINIC_OR_DEPARTMENT_OTHER): Payer: Medicare Other | Admitting: Lab

## 2012-08-02 ENCOUNTER — Encounter: Payer: Self-pay | Admitting: Oncology

## 2012-08-02 ENCOUNTER — Ambulatory Visit (HOSPITAL_BASED_OUTPATIENT_CLINIC_OR_DEPARTMENT_OTHER): Payer: Medicare Other | Admitting: Oncology

## 2012-08-02 VITALS — BP 143/81 | HR 101 | Temp 97.2°F | Resp 20 | Ht 64.0 in | Wt 185.5 lb

## 2012-08-02 DIAGNOSIS — C569 Malignant neoplasm of unspecified ovary: Secondary | ICD-10-CM

## 2012-08-02 DIAGNOSIS — D649 Anemia, unspecified: Secondary | ICD-10-CM

## 2012-08-02 LAB — CBC WITH DIFFERENTIAL/PLATELET
Basophils Absolute: 0 10*3/uL (ref 0.0–0.1)
Eosinophils Absolute: 0.4 10*3/uL (ref 0.0–0.5)
HCT: 33.5 % — ABNORMAL LOW (ref 34.8–46.6)
HGB: 10.7 g/dL — ABNORMAL LOW (ref 11.6–15.9)
LYMPH%: 24.5 % (ref 14.0–49.7)
MCV: 88.4 fL (ref 79.5–101.0)
MONO#: 0.6 10*3/uL (ref 0.1–0.9)
MONO%: 5.4 % (ref 0.0–14.0)
NEUT#: 7.2 10*3/uL — ABNORMAL HIGH (ref 1.5–6.5)
NEUT%: 66.1 % (ref 38.4–76.8)
Platelets: 291 10*3/uL (ref 145–400)
RBC: 3.79 10*6/uL (ref 3.70–5.45)
WBC: 10.8 10*3/uL — ABNORMAL HIGH (ref 3.9–10.3)

## 2012-08-02 MED ORDER — DEXAMETHASONE 4 MG PO TABS: ORAL_TABLET | ORAL | Status: DC

## 2012-08-02 NOTE — Patient Instructions (Signed)
Decadron (dexamethasone) 4 mg    Five tablets with food 12 hours before taxol chemotherapy

## 2012-08-02 NOTE — Telephone Encounter (Signed)
Gave pt appt for May 2014 lab, chemo then MD visit with labs  and chemo on June 2014

## 2012-08-02 NOTE — Telephone Encounter (Signed)
Per staff message and POF I have scheduled appts.  JMW  

## 2012-08-02 NOTE — Telephone Encounter (Signed)
Jaclyn [name] has an appointment with her pastor at 1400 on 08-09-12.  Her treatment is at 1100 that day for 2 hours.  She wanted to reschedule the chemotherapy .   Suggested that she re schedule her pastor's appointment to a later time that day or another day as the chemotherapy treatments are difficult to re schedule especially with the [facility] coming up.  Jaclyn [name] stated that she would reschedule her pastor's appointment and keep her chemotherapy treatment on 08-09-12 at 1100.

## 2012-08-02 NOTE — Progress Notes (Signed)
OFFICE PROGRESS NOTE   08/02/2012   Physicians:W.Brewster, V.Bland, J.Mann, K.Reddy   INTERVAL HISTORY:   Patient is seen, together with family member, in follow up of recurrent ovarian cancer which has been restaged after 12 cycles of weekly taxol with CT AP done 07-26-12. The CT shows improvement in area at inferior right hepatic lobe, no ascites, no change in calcified adenopathy and no new lesions. Patient has felt very well since cycle 12 weekly taxol given 4-15 and 07-12-12. She has tolerated this chemotherapy extremely well and agrees with continuing for at least next 6-8 weeks, and we will try to coordinate visit back to Dr Nelly Rout then. She has PAC in, last flushed with CT on 07-26-12.  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 She had optimal debulking by Dr.Brewster, 10 liters of ascites at that procedure. She was treated with 8 cycles of taxol/carboplatin thru Aug. 2011, with CA125 still ~ 40 at completion of the chemotherapy. The tumor was ER + at 100% and she was maintained on tamoxifen from Oct 2011 until Nov 2013. CA125 nadir was 20 in April 2012, with gradual rise subsequently, tho patient remained asymptomatic until SBO (hospitalized 11-13 thru 02-11-12 for SBO). SHe was treated with single agent taxol day 1 day 8 q 21 days from 03-21-13 x 12 cycles, thru 07-12-12. CA 125 was 88 on 03-08-12, 53 in Feb and 70 in late March.  Good appetite and energy. No abdominal or pelvic pain, no N/V, bowels moving regularly with no laxatives. No SOB, no fever or symptoms of infection, no bleeding, no LE swelling. Slight transient numbness in left hand only, unchanged. Remainder of 10 point Review of Systems negative.  Objective:  Vital signs in last 24 hours:  BP 143/81  Pulse 101  Temp(Src) 97.2 F (36.2 C)  Resp 20  Ht 5\' 4"  (1.626 m)  Wt 185 lb 8 oz (84.142 kg)  BMI 31.83 kg/m2  Weight is up 1  lb. Easily ambulatory, looks comfortable.  HEENT:PERRLA, sclera clear, anicteric and oropharynx clear, no lesions. Wearing wig. Edentulous with plates. LymphaticsCervical, supraclavicular, and axillary nodes normal.  No inguinal adenopathy Resp: clear to auscultation bilaterally and normal percussion bilaterally Cardio: regular rate and rhythm GI: soft, non-tender; bowel sounds normal; no masses,  no organomegaly Not distended. Extremities: extremities normal, atraumatic, no cyanosis or edema Neuro:no sensory deficits noted Breast:normal without suspicious masses, skin or nipple changes or axillary nodes Portacath-without erythema or tenderness  Lab Results:  Results for orders placed in visit on 08/02/12  CBC WITH DIFFERENTIAL      Result Value Range   WBC 10.8 (*) 3.9 - 10.3 10e3/uL   NEUT# 7.2 (*) 1.5 - 6.5 10e3/uL   HGB 10.7 (*) 11.6 - 15.9 g/dL   HCT 16.1 (*) 09.6 - 04.5 %   Platelets 291  145 - 400 10e3/uL   MCV 88.4  79.5 - 101.0 fL   MCH 28.2  25.1 - 34.0 pg   MCHC 31.9  31.5 - 36.0 g/dL   RBC 4.09  8.11 - 9.14 10e6/uL   RDW 16.8 (*) 11.2 - 14.5 %   lymph# 2.7  0.9 - 3.3 10e3/uL   MONO# 0.6  0.1 - 0.9 10e3/uL   Eosinophils Absolute 0.4  0.0 - 0.5 10e3/uL   Basophils Absolute 0.0  0.0 - 0.1 10e3/uL   NEUT% 66.1  38.4 - 76.8 %   LYMPH% 24.5  14.0 - 49.7 %   MONO% 5.4  0.0 - 14.0 %   EOS% 3.8  0.0 - 7.0 %   BASO% 0.2  0.0 - 2.0 %    This Hgb is up from 9.9 on 07-12-12 Studies/Results: CT ABDOMEN AND PELVIS WITH CONTRAST 07-26-2012 Technique: Multidetector CT imaging of the abdomen and pelvis was  performed following the standard protocol during bolus  administration of intravenous contrast.  Contrast: OMNIPAQUE IOHEXOL 300 MG/ML SOLN  Comparison: 02/03/2012  Findings:  Lung bases: The lung bases are clear. No pericardial or pleural  effusion.  Abdomen/pelvis: There is diffuse fatty infiltration throughout the  liver parenchyma. There is no focal liver  abnormality identified.  Prior cholecystectomy. Normal appearance of the pancreas. No  biliary dilatation. The spleen is unremarkable.  The adrenal glands are both normal. Scarring involves the inferior  pole of the right kidney. Small hypodensity within the right  kidney measures 6 mm, image 30 of series 6. This is unchanged from  previous exam and remains too small to characterize. The urinary  bladder appears within normal limits. The previous hysterectomy.  Calcified atherosclerotic disease affects the abdominal aorta.  There is no aneurysm. Calcified porta hepatic lymph nodes are  identified and appear similar to previous exam consistent with  treated tumor metastasis. Small calcified retroperitoneal lymph  nodes are also similar to previous exam consistent with treated  tumor. No pathologic adenopathy noted within the pelvis or  inguinal region.  There is no free fluid or abnormal fluid collection identified  within the abdomen or pelvis. Calcified peritoneal implant is  noted within the right pericolic gutter. This is stable from  previous study. Small amount of soft tissue adjacent to the  inferior margin of the right hepatic lobe measures 1.5 x 0.6 cm,  image 37/series 2. This is compared with 2.3 x 0.8 cm previously.  No new or enlarging peritoneal implants identified.  The stomach appears within normal limits. The small bowel loops  are unremarkable. Normal appearance of the colon  Bones/Musculoskeletal: Review of the visualized bony structures is  significant for mild spondylosis. This is most severe at the L5-S1  level. No aggressive lytic or sclerotic bone lesions identified.  IMPRESSION:  1. Improved appearance of the abdomen and pelvis.  2. Resolution of ascites, partial small bowel obstruction and left  upper quadrant peritoneal implant.  3. Stable calcified retroperitoneal lymph nodes consistent with  treated tumor.  4. Hepatic steatosis.  We have discussed CT  results above and I have shown them the images on PACS.   Medications: I have reviewed the patient's current medications.  Communication to gyn oncology requesting visit back to Dr Nelly Rout in ~ 6-8 weeks.   As patient is tolerating treatment with day 1 day 8 taxol q 21 days so well, and with improvement by CT, marker and clinically, we will continue this regimen for next ~ 6-8 weeks and also arrange follow up with Dr Nelly Rout. Cycle 13 will be 5-20 and 08-17-12 (date of "day 8" due to [facility] Day holiday).  Assessment/Plan:  1. progression of recurrent ovarian carcinoma, complicated by self-limited SBO in Nov 2013: Clinically doing well and some improvement by CT. Will continue weekly taxol as above; will have follow up with Dr Nelly Rout probably after another couple of cycles. Note she had long stable disease on tamoxifen previously, such that AI might be consideration after chemo.  2.PAC in  3.multifactorial anemia: hgb stable today, on oral iron. Declined epogen. Has  needed PRBCs with prior chemo, note family (not patient) are Jehovah's Witnesses 4.Marland KitchenHTN : followed by Dr Rennis Golden  5.history of anxiety/ depression for which she is followed by psychiatry   Patient and family member were comfortable with discussion and plan.  Sorah Falkenstein P, MD   08/02/2012, 9:53 AM

## 2012-08-09 ENCOUNTER — Ambulatory Visit (HOSPITAL_BASED_OUTPATIENT_CLINIC_OR_DEPARTMENT_OTHER): Payer: Medicare Other

## 2012-08-09 ENCOUNTER — Other Ambulatory Visit (HOSPITAL_BASED_OUTPATIENT_CLINIC_OR_DEPARTMENT_OTHER): Payer: Medicare Other | Admitting: Lab

## 2012-08-09 VITALS — BP 142/83 | HR 104 | Temp 97.5°F | Resp 26

## 2012-08-09 DIAGNOSIS — C569 Malignant neoplasm of unspecified ovary: Secondary | ICD-10-CM

## 2012-08-09 DIAGNOSIS — Z5111 Encounter for antineoplastic chemotherapy: Secondary | ICD-10-CM

## 2012-08-09 LAB — CBC WITH DIFFERENTIAL/PLATELET
BASO%: 0.1 % (ref 0.0–2.0)
Basophils Absolute: 0 10*3/uL (ref 0.0–0.1)
EOS%: 0 % (ref 0.0–7.0)
Eosinophils Absolute: 0 10*3/uL (ref 0.0–0.5)
HCT: 34.1 % — ABNORMAL LOW (ref 34.8–46.6)
HGB: 11.1 g/dL — ABNORMAL LOW (ref 11.6–15.9)
LYMPH%: 16.5 % (ref 14.0–49.7)
MCH: 28.6 pg (ref 25.1–34.0)
MCHC: 32.6 g/dL (ref 31.5–36.0)
MCV: 87.9 fL (ref 79.5–101.0)
MONO#: 0.1 10*3/uL (ref 0.1–0.9)
MONO%: 0.9 % (ref 0.0–14.0)
NEUT#: 7 10*3/uL — ABNORMAL HIGH (ref 1.5–6.5)
NEUT%: 82.5 % — ABNORMAL HIGH (ref 38.4–76.8)
Platelets: 290 10*3/uL (ref 145–400)
RBC: 3.88 10*6/uL (ref 3.70–5.45)
RDW: 16.4 % — ABNORMAL HIGH (ref 11.2–14.5)
WBC: 8.4 10*3/uL (ref 3.9–10.3)
lymph#: 1.4 10*3/uL (ref 0.9–3.3)
nRBC: 0 % (ref 0–0)

## 2012-08-09 MED ORDER — DEXAMETHASONE SODIUM PHOSPHATE 20 MG/5ML IJ SOLN
20.0000 mg | Freq: Once | INTRAMUSCULAR | Status: AC
  Administered 2012-08-09: 20 mg via INTRAVENOUS

## 2012-08-09 MED ORDER — DIPHENHYDRAMINE HCL 50 MG/ML IJ SOLN
50.0000 mg | Freq: Once | INTRAMUSCULAR | Status: AC
  Administered 2012-08-09: 50 mg via INTRAVENOUS

## 2012-08-09 MED ORDER — SODIUM CHLORIDE 0.9 % IJ SOLN
10.0000 mL | INTRAMUSCULAR | Status: DC | PRN
  Administered 2012-08-09: 10 mL
  Filled 2012-08-09: qty 10

## 2012-08-09 MED ORDER — PACLITAXEL CHEMO INJECTION 300 MG/50ML
80.0000 mg/m2 | Freq: Once | INTRAVENOUS | Status: AC
  Administered 2012-08-09: 156 mg via INTRAVENOUS
  Filled 2012-08-09: qty 26

## 2012-08-09 MED ORDER — FAMOTIDINE IN NACL 20-0.9 MG/50ML-% IV SOLN
20.0000 mg | Freq: Once | INTRAVENOUS | Status: AC
  Administered 2012-08-09: 20 mg via INTRAVENOUS

## 2012-08-09 MED ORDER — HEPARIN SOD (PORK) LOCK FLUSH 100 UNIT/ML IV SOLN
500.0000 [IU] | Freq: Once | INTRAVENOUS | Status: AC | PRN
  Administered 2012-08-09: 500 [IU]
  Filled 2012-08-09: qty 5

## 2012-08-09 MED ORDER — SODIUM CHLORIDE 0.9 % IV SOLN
Freq: Once | INTRAVENOUS | Status: AC
  Administered 2012-08-09: 11:00:00 via INTRAVENOUS

## 2012-08-09 MED ORDER — ONDANSETRON 8 MG/50ML IVPB (CHCC)
8.0000 mg | Freq: Once | INTRAVENOUS | Status: AC
  Administered 2012-08-09: 8 mg via INTRAVENOUS

## 2012-08-09 NOTE — Patient Instructions (Addendum)
[facility] Discharge Instructions for Patients Receiving Chemotherapy  Today you received the following chemotherapy agents taxol  To help prevent nausea and vomiting after your treatment, we encourage you to take your nausea medication if needed.   If you develop nausea and vomiting that is not controlled by your nausea medication, call the clinic. If it is after clinic hours your family physician or the after hours number for the clinic or go to the Emergency Department.   BELOW ARE SYMPTOMS THAT SHOULD BE REPORTED IMMEDIATELY:  *FEVER GREATER THAN 100.5 F  *CHILLS WITH OR WITHOUT FEVER  NAUSEA AND VOMITING THAT IS NOT CONTROLLED WITH YOUR NAUSEA MEDICATION  *UNUSUAL SHORTNESS OF BREATH  *UNUSUAL BRUISING OR BLEEDING  TENDERNESS IN MOUTH AND THROAT WITH OR WITHOUT PRESENCE OF ULCERS  *URINARY PROBLEMS  *BOWEL PROBLEMS  UNUSUAL RASH Items with * indicate a potential emergency and should be followed up as soon as possible.  Feel free to call the clinic you have any questions or concerns. The clinic phone number is [phone].   I have been informed and understand all the instructions given to me. I know to contact the clinic, my physician, or go to the Emergency Department if any problems should occur. I do not have any questions at this time, but understand that I may call the clinic during office hours   should I have any questions or need assistance in obtaining follow up care.    __________________________________________  _____________  __________ Signature of Patient or Authorized Representative            Date                   Time    __________________________________________ Nurse's Signature

## 2012-08-17 ENCOUNTER — Other Ambulatory Visit (HOSPITAL_BASED_OUTPATIENT_CLINIC_OR_DEPARTMENT_OTHER): Payer: Medicare Other | Admitting: Lab

## 2012-08-17 ENCOUNTER — Other Ambulatory Visit: Payer: Self-pay | Admitting: Oncology

## 2012-08-17 ENCOUNTER — Ambulatory Visit (HOSPITAL_BASED_OUTPATIENT_CLINIC_OR_DEPARTMENT_OTHER): Payer: Medicare Other

## 2012-08-17 VITALS — BP 165/84 | HR 96 | Temp 98.0°F | Resp 20

## 2012-08-17 DIAGNOSIS — C569 Malignant neoplasm of unspecified ovary: Secondary | ICD-10-CM

## 2012-08-17 DIAGNOSIS — Z5111 Encounter for antineoplastic chemotherapy: Secondary | ICD-10-CM

## 2012-08-17 LAB — CBC WITH DIFFERENTIAL/PLATELET
Basophils Absolute: 0 10*3/uL (ref 0.0–0.1)
Eosinophils Absolute: 0 10*3/uL (ref 0.0–0.5)
HGB: 10.3 g/dL — ABNORMAL LOW (ref 11.6–15.9)
MCV: 88.2 fL (ref 79.5–101.0)
MONO#: 0.1 10*3/uL (ref 0.1–0.9)
NEUT#: 3.1 10*3/uL (ref 1.5–6.5)
RDW: 16.3 % — ABNORMAL HIGH (ref 11.2–14.5)
lymph#: 0.8 10*3/uL — ABNORMAL LOW (ref 0.9–3.3)

## 2012-08-17 MED ORDER — ONDANSETRON 8 MG/50ML IVPB (CHCC)
8.0000 mg | Freq: Once | INTRAVENOUS | Status: AC
  Administered 2012-08-17: 8 mg via INTRAVENOUS

## 2012-08-17 MED ORDER — SODIUM CHLORIDE 0.9 % IV SOLN
80.0000 mg/m2 | Freq: Once | INTRAVENOUS | Status: AC
  Administered 2012-08-17: 156 mg via INTRAVENOUS
  Filled 2012-08-17: qty 26

## 2012-08-17 MED ORDER — FAMOTIDINE IN NACL 20-0.9 MG/50ML-% IV SOLN
20.0000 mg | Freq: Once | INTRAVENOUS | Status: AC
  Administered 2012-08-17: 20 mg via INTRAVENOUS

## 2012-08-17 MED ORDER — SODIUM CHLORIDE 0.9 % IV SOLN
Freq: Once | INTRAVENOUS | Status: AC
  Administered 2012-08-17: 12:00:00 via INTRAVENOUS

## 2012-08-17 MED ORDER — DEXAMETHASONE SODIUM PHOSPHATE 20 MG/5ML IJ SOLN
20.0000 mg | Freq: Once | INTRAMUSCULAR | Status: AC
  Administered 2012-08-17: 20 mg via INTRAVENOUS

## 2012-08-17 MED ORDER — SODIUM CHLORIDE 0.9 % IJ SOLN
10.0000 mL | INTRAMUSCULAR | Status: DC | PRN
  Administered 2012-08-17: 10 mL
  Filled 2012-08-17: qty 10

## 2012-08-17 MED ORDER — HEPARIN SOD (PORK) LOCK FLUSH 100 UNIT/ML IV SOLN
500.0000 [IU] | Freq: Once | INTRAVENOUS | Status: AC | PRN
  Administered 2012-08-17: 500 [IU]
  Filled 2012-08-17: qty 5

## 2012-08-17 MED ORDER — DIPHENHYDRAMINE HCL 50 MG/ML IJ SOLN
50.0000 mg | Freq: Once | INTRAMUSCULAR | Status: AC
  Administered 2012-08-17: 25 mg via INTRAVENOUS

## 2012-08-17 NOTE — Patient Instructions (Signed)
[facility] Discharge Instructions for Patients Receiving Chemotherapy  Today you received the following chemotherapy agent Taxol.  To help prevent nausea and vomiting after your treatment, we encourage you to take your nausea medication. Begin taking your nausea medication as often as prescribed for by Dr Darrold Span.    If you develop nausea and vomiting that is not controlled by your nausea medication, call the clinic. If it is after clinic hours your family physician or the after hours number for the clinic or go to the Emergency Department.   BELOW ARE SYMPTOMS THAT SHOULD BE REPORTED IMMEDIATELY:  *FEVER GREATER THAN 100.5 F  *CHILLS WITH OR WITHOUT FEVER  NAUSEA AND VOMITING THAT IS NOT CONTROLLED WITH YOUR NAUSEA MEDICATION  *UNUSUAL SHORTNESS OF BREATH  *UNUSUAL BRUISING OR BLEEDING  TENDERNESS IN MOUTH AND THROAT WITH OR WITHOUT PRESENCE OF ULCERS  *URINARY PROBLEMS  *BOWEL PROBLEMS  UNUSUAL RASH Items with * indicate a potential emergency and should be followed up as soon as possible.  One of the nurses will contact you 24 hours after your treatment. Please let the nurse know about any problems that you may have experienced. Feel free to call the clinic you have any questions or concerns. The clinic phone number is [phone].   I have been informed and understand all the instructions given to me. I know to contact the clinic, my physician, or go to the Emergency Department if any problems should occur. I do not have any questions at this time, but understand that I may call the clinic during office hours   should I have any questions or need assistance in obtaining follow up care.    __________________________________________  _____________  __________ Signature of Patient or Authorized Representative            Date                   Time    __________________________________________ Nurse's Signature

## 2012-08-17 NOTE — Progress Notes (Signed)
1235 Patient c/o having "restless legs during treatment" informed Dr Darrold Span, per MD v.o. To reduce benedryl to 25 mg. Patient informed.  1320 Patient reports that she is having no restless legs and expressed thank you.  1427 Patient states she has been "expriencing restless legs for the last 15 minutes." Taxol infusion complete at this time.

## 2012-08-30 ENCOUNTER — Other Ambulatory Visit (HOSPITAL_BASED_OUTPATIENT_CLINIC_OR_DEPARTMENT_OTHER): Payer: Medicare Other | Admitting: Lab

## 2012-08-30 ENCOUNTER — Ambulatory Visit (HOSPITAL_BASED_OUTPATIENT_CLINIC_OR_DEPARTMENT_OTHER): Payer: Medicare Other

## 2012-08-30 ENCOUNTER — Encounter: Payer: Self-pay | Admitting: Oncology

## 2012-08-30 ENCOUNTER — Ambulatory Visit (HOSPITAL_BASED_OUTPATIENT_CLINIC_OR_DEPARTMENT_OTHER): Payer: Medicare Other | Admitting: Oncology

## 2012-08-30 VITALS — BP 168/81 | HR 90 | Temp 98.5°F | Resp 18 | Ht 64.0 in | Wt 186.9 lb

## 2012-08-30 DIAGNOSIS — F341 Dysthymic disorder: Secondary | ICD-10-CM

## 2012-08-30 DIAGNOSIS — D649 Anemia, unspecified: Secondary | ICD-10-CM

## 2012-08-30 DIAGNOSIS — I10 Essential (primary) hypertension: Secondary | ICD-10-CM

## 2012-08-30 DIAGNOSIS — Z5111 Encounter for antineoplastic chemotherapy: Secondary | ICD-10-CM

## 2012-08-30 DIAGNOSIS — C569 Malignant neoplasm of unspecified ovary: Secondary | ICD-10-CM

## 2012-08-30 LAB — COMPREHENSIVE METABOLIC PANEL (CC13)
ALT: 10 U/L (ref 0–55)
AST: 14 U/L (ref 5–34)
Alkaline Phosphatase: 90 U/L (ref 40–150)
Creatinine: 0.9 mg/dL (ref 0.6–1.1)
Total Bilirubin: 0.27 mg/dL (ref 0.20–1.20)

## 2012-08-30 LAB — CBC WITH DIFFERENTIAL/PLATELET
BASO%: 0.2 % (ref 0.0–2.0)
EOS%: 0 % (ref 0.0–7.0)
MCH: 28.7 pg (ref 25.1–34.0)
MCV: 87.2 fL (ref 79.5–101.0)
MONO%: 1.2 % (ref 0.0–14.0)
RBC: 3.9 10*6/uL (ref 3.70–5.45)
RDW: 16.3 % — ABNORMAL HIGH (ref 11.2–14.5)
nRBC: 0 % (ref 0–0)

## 2012-08-30 MED ORDER — HEPARIN SOD (PORK) LOCK FLUSH 100 UNIT/ML IV SOLN
500.0000 [IU] | Freq: Once | INTRAVENOUS | Status: AC | PRN
  Administered 2012-08-30: 500 [IU]
  Filled 2012-08-30: qty 5

## 2012-08-30 MED ORDER — DEXAMETHASONE SODIUM PHOSPHATE 20 MG/5ML IJ SOLN
20.0000 mg | Freq: Once | INTRAMUSCULAR | Status: AC
  Administered 2012-08-30: 20 mg via INTRAVENOUS

## 2012-08-30 MED ORDER — DIPHENHYDRAMINE HCL 50 MG/ML IJ SOLN
50.0000 mg | Freq: Once | INTRAMUSCULAR | Status: AC
  Administered 2012-08-30: 25 mg via INTRAVENOUS

## 2012-08-30 MED ORDER — SODIUM CHLORIDE 0.9 % IV SOLN
80.0000 mg/m2 | Freq: Once | INTRAVENOUS | Status: AC
  Administered 2012-08-30: 156 mg via INTRAVENOUS
  Filled 2012-08-30: qty 26

## 2012-08-30 MED ORDER — ONDANSETRON 8 MG/50ML IVPB (CHCC)
8.0000 mg | Freq: Once | INTRAVENOUS | Status: AC
  Administered 2012-08-30: 8 mg via INTRAVENOUS

## 2012-08-30 MED ORDER — SODIUM CHLORIDE 0.9 % IV SOLN
Freq: Once | INTRAVENOUS | Status: AC
  Administered 2012-08-30: 16:00:00 via INTRAVENOUS

## 2012-08-30 MED ORDER — SODIUM CHLORIDE 0.9 % IJ SOLN
10.0000 mL | INTRAMUSCULAR | Status: DC | PRN
  Administered 2012-08-30: 10 mL
  Filled 2012-08-30: qty 10

## 2012-08-30 MED ORDER — FAMOTIDINE IN NACL 20-0.9 MG/50ML-% IV SOLN
20.0000 mg | Freq: Once | INTRAVENOUS | Status: AC
  Administered 2012-08-30: 20 mg via INTRAVENOUS

## 2012-08-30 NOTE — Patient Instructions (Signed)
[facility] Discharge Instructions for Patients Receiving Chemotherapy  Today you received the following chemotherapy agents Taxol  To help prevent nausea and vomiting after your treatment, we encourage you to take your nausea medication as needed.   If you develop nausea and vomiting that is not controlled by your nausea medication, call the clinic.   BELOW ARE SYMPTOMS THAT SHOULD BE REPORTED IMMEDIATELY:  *FEVER GREATER THAN 100.5 F  *CHILLS WITH OR WITHOUT FEVER  NAUSEA AND VOMITING THAT IS NOT CONTROLLED WITH YOUR NAUSEA MEDICATION  *UNUSUAL SHORTNESS OF BREATH  *UNUSUAL BRUISING OR BLEEDING  TENDERNESS IN MOUTH AND THROAT WITH OR WITHOUT PRESENCE OF ULCERS  *URINARY PROBLEMS  *BOWEL PROBLEMS  UNUSUAL RASH Items with * indicate a potential emergency and should be followed up as soon as possible.  Feel free to call the clinic you have any questions or concerns. The clinic phone number is [phone].    

## 2012-08-30 NOTE — Progress Notes (Signed)
OFFICE PROGRESS NOTE   08/30/2012   Physicians:W.Brewster, V.Bland, J.Mann, K.Reddy   INTERVAL HISTORY:   Patient is seen, together with daughter, in continuing attention to recurrent ovarian cancer, doing well on single agent taxol which she has received beginning 03-21-13, day 1 & 8 every 21 days. The taxol is planned thru 09-06-12, with follow up visit to Dr Nelly Rout on 09-13-12. She has tolerated the taxol very well. Last CT on 07-26-12 had improvement in area of right hepatic lobe and no recurrent ascites.   She has PAC in, will be flushed with treatment on 09-06-12.  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 She had optimal debulking by Dr.Brewster, 10 liters of ascites at that procedure. She was treated with 8 cycles of taxol/carboplatin thru Aug. 2011, with CA125 still ~ 40 at completion of the chemotherapy. The tumor was ER + at 100% and she was maintained on tamoxifen from Oct 2011 until Nov 2013. CA125 nadir was 20 in April 2012, with gradual rise subsequently, tho patient remained asymptomatic until SBO in 01-2012. Scans in 01-2012 showed moderShe was treated with single agent taxol day 1 day 8 q 21 days from 03-21-13 x 12 cycles, thru 07-12-12. CA 125 was 88 on 03-08-12, 53 in Feb and 50 in April.  Recent change in psychiatric medication, which has been very helpful "I have not felt this good in years"; she will call RN back with new medication. No abdominal or pelvic discomfort, no vominting, good appetite and tolerating regular diet with no difficulty. Bowels moving regularly. No bleeding. No SOB, no fever, no problems with PAC. Only occasional and transient mild numbness hands or feet, not bothersome. Remainder of 10 point Review of Systems negative.  Patient states that she has Living Will and HCPOA papers completed "I will bring them to my new doctor next  time".  Objective:  Vital signs in last 24 hours:  BP 168/81  Pulse 90  Temp(Src) 98.5 F (36.9 C) (Oral)  Resp 18  Ht 5\' 4"  (1.626 m)  Wt 186 lb 14.4 oz (84.777 kg)  BMI 32.07 kg/m2  Weight is up 1 lb. Easily ambulatory, looks comfortable, very cheerful.  HEENT:PERRLA, sclera clear, anicteric and oropharynx clear, no lesions Edentulous with plates. LymphaticsCervical, supraclavicular, and axillary nodes normal. Resp: clear to auscultation bilaterally and normal percussion bilaterally Cardio: regular rate and rhythm GI: soft, not tender, not distended. Normally active BS. No appreciable HSM or mass Extremities: extremities normal, atraumatic, no cyanosis or edema Neuro:no sensory deficits noted Skin without rash or ecchymosis Portacath -without erythema or tenderness  Lab Results:  Results for orders placed in visit on 08/30/12  CBC WITH DIFFERENTIAL      Result Value Range   WBC 5.8  3.9 - 10.3 10e3/uL   NEUT# 4.6  1.5 - 6.5 10e3/uL   HGB 11.2 (*) 11.6 - 15.9 g/dL   HCT 16.1 (*) 09.6 - 04.5 %   Platelets 300  145 - 400 10e3/uL   MCV 87.2  79.5 - 101.0 fL   MCH 28.7  25.1 - 34.0 pg   MCHC 32.9  31.5 - 36.0 g/dL   RBC 4.09  8.11 - 9.14 10e6/uL   RDW 16.3 (*) 11.2 - 14.5 %   lymph# 1.1  0.9 - 3.3 10e3/uL   MONO# 0.1  0.1 - 0.9 10e3/uL   Eosinophils Absolute 0.0  0.0 - 0.5 10e3/uL  Basophils Absolute 0.0  0.0 - 0.1 10e3/uL   NEUT% 79.6 (*) 38.4 - 76.8 %   LYMPH% 19.0  14.0 - 49.7 %   MONO% 1.2  0.0 - 14.0 %   EOS% 0.0  0.0 - 7.0 %   BASO% 0.2  0.0 - 2.0 %   nRBC 0  0 - 0 %    CMET available after visit normal with exception of glucose 115  CA125 available after visit is 48, stable from late April.  Studies/Results:  No results found.  Medications: I have reviewed the patient's current medications.  Assessment/Plan: 1.recurrent ovarian carcinoma: no problems with SBO since Nov 2013 and clinically has improved with the low dose taxol. She will have  treatment today and again 09-08-12, then will see Dr Nelly Rout on 09-13-12. Note clinically stable for extended time on tamoxifen previously,  ? if aromatase inhibitor or other hormonal intervention might be reasonable again after chemo (not discussed with patient today) 2.PAC in  3.multifactorial anemia: hgb stable today, on oral iron. Declined epogen. Has needed PRBCs with prior chemo, note family (not patient) are [employer]  4.Marland KitchenHTN : followed by Dr Rennis Golden  5.history of anxiety/ depression for which she is followed by psychiatry  6.Advance Directives completed and she will bring at next visit.  Depending on recommendations from gyn oncology, she may need additional visits set up with medical oncology. Note she will need PAC flushed every 6-8 weeks when not otherwise used.   Ambera Fedele P, MD   08/30/2012, 3:21 PM

## 2012-08-31 LAB — CA 125: CA 125: 48.4 U/mL — ABNORMAL HIGH (ref 0.0–30.2)

## 2012-09-06 ENCOUNTER — Ambulatory Visit (HOSPITAL_BASED_OUTPATIENT_CLINIC_OR_DEPARTMENT_OTHER): Payer: Medicare Other

## 2012-09-06 ENCOUNTER — Other Ambulatory Visit (HOSPITAL_BASED_OUTPATIENT_CLINIC_OR_DEPARTMENT_OTHER): Payer: Medicare Other | Admitting: Lab

## 2012-09-06 DIAGNOSIS — C569 Malignant neoplasm of unspecified ovary: Secondary | ICD-10-CM

## 2012-09-06 DIAGNOSIS — Z5111 Encounter for antineoplastic chemotherapy: Secondary | ICD-10-CM

## 2012-09-06 LAB — CBC WITH DIFFERENTIAL/PLATELET
Basophils Absolute: 0 10*3/uL (ref 0.0–0.1)
Eosinophils Absolute: 0 10*3/uL (ref 0.0–0.5)
HCT: 31.9 % — ABNORMAL LOW (ref 34.8–46.6)
HGB: 10.3 g/dL — ABNORMAL LOW (ref 11.6–15.9)
MONO#: 0 10*3/uL — ABNORMAL LOW (ref 0.1–0.9)
NEUT#: 3.5 10*3/uL (ref 1.5–6.5)
NEUT%: 78.5 % — ABNORMAL HIGH (ref 38.4–76.8)
RDW: 16.3 % — ABNORMAL HIGH (ref 11.2–14.5)
WBC: 4.4 10*3/uL (ref 3.9–10.3)
lymph#: 0.9 10*3/uL (ref 0.9–3.3)

## 2012-09-06 MED ORDER — SODIUM CHLORIDE 0.9 % IJ SOLN
10.0000 mL | INTRAMUSCULAR | Status: DC | PRN
  Administered 2012-09-06: 10 mL
  Filled 2012-09-06: qty 10

## 2012-09-06 MED ORDER — FAMOTIDINE IN NACL 20-0.9 MG/50ML-% IV SOLN
20.0000 mg | Freq: Once | INTRAVENOUS | Status: AC
  Administered 2012-09-06: 20 mg via INTRAVENOUS

## 2012-09-06 MED ORDER — SODIUM CHLORIDE 0.9 % IV SOLN
Freq: Once | INTRAVENOUS | Status: AC
  Administered 2012-09-06: 12:00:00 via INTRAVENOUS

## 2012-09-06 MED ORDER — PACLITAXEL CHEMO INJECTION 300 MG/50ML
80.0000 mg/m2 | Freq: Once | INTRAVENOUS | Status: AC
  Administered 2012-09-06: 156 mg via INTRAVENOUS
  Filled 2012-09-06: qty 26

## 2012-09-06 MED ORDER — DEXAMETHASONE SODIUM PHOSPHATE 20 MG/5ML IJ SOLN
20.0000 mg | Freq: Once | INTRAMUSCULAR | Status: AC
  Administered 2012-09-06: 20 mg via INTRAVENOUS

## 2012-09-06 MED ORDER — ONDANSETRON 8 MG/50ML IVPB (CHCC)
8.0000 mg | Freq: Once | INTRAVENOUS | Status: AC
  Administered 2012-09-06: 8 mg via INTRAVENOUS

## 2012-09-06 MED ORDER — HEPARIN SOD (PORK) LOCK FLUSH 100 UNIT/ML IV SOLN
500.0000 [IU] | Freq: Once | INTRAVENOUS | Status: AC | PRN
  Administered 2012-09-06: 500 [IU]
  Filled 2012-09-06: qty 5

## 2012-09-06 MED ORDER — DIPHENHYDRAMINE HCL 50 MG/ML IJ SOLN
50.0000 mg | Freq: Once | INTRAMUSCULAR | Status: AC
  Administered 2012-09-06: 25 mg via INTRAVENOUS

## 2012-09-06 NOTE — Patient Instructions (Addendum)
[facility] Discharge Instructions for Patients Receiving Chemotherapy  Today you received the following chemotherapy agents Taxol.  To help prevent nausea and vomiting after your treatment, we encourage you to take your nausea medication as directed.    If you develop nausea and vomiting that is not controlled by your nausea medication, call the clinic.   BELOW ARE SYMPTOMS THAT SHOULD BE REPORTED IMMEDIATELY:  *FEVER GREATER THAN 100.5 F  *CHILLS WITH OR WITHOUT FEVER  NAUSEA AND VOMITING THAT IS NOT CONTROLLED WITH YOUR NAUSEA MEDICATION  *UNUSUAL SHORTNESS OF BREATH  *UNUSUAL BRUISING OR BLEEDING  TENDERNESS IN MOUTH AND THROAT WITH OR WITHOUT PRESENCE OF ULCERS  *URINARY PROBLEMS  *BOWEL PROBLEMS  UNUSUAL RASH Items with * indicate a potential emergency and should be followed up as soon as possible.  Feel free to call the clinic you have any questions or concerns. The clinic phone number is [phone].    

## 2012-09-12 ENCOUNTER — Telehealth: Payer: Self-pay | Admitting: Gynecologic Oncology

## 2012-09-12 NOTE — Telephone Encounter (Signed)
REASON FOR VISIT: Recurrent  stage 3C ovarian cancer.    ASSESSMENT/PLAN : Ms. Murin is a 72 y.o.-year-old with recurrent endometroid ovarian cancer, currently on single agent taxol.   HISTORY OF PRESENT ILLNESS: This is a 72 year old with remote history of a vaginal hysterectomy and right salpingo-oophorectomy completed in 1988, pelvic mass was noted in January 2012. 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 She received 6 cycles of Taxol and carboplatin therapy with persistent elevations in her CA-125. Treatment was complicated by anemia and requirement for multiple blood transfusions. Her CA-125 in September 2011, remained minimally elevated to 40. On May 16, 2010  a trial with tamoxifen was undertaken given her poor tolerance of chemotherapy. Her CA-125 nadir was 20, the trend in Ca125  Has been progressively increasing to a value of 71.8.  PT  CT abd/pelvis  06/2101 without any evidence of disease.   Recurrent endometrial cancer diagnosed at the time of presentation of SBO.  Patient has received single agent taxol with good response.   CT scan 07/26/2012 notable findings There is no free fluid or abnormal fluid collection identified within the abdomen or pelvis. Calcified peritoneal implant is  noted within the right pericolic gutter. This is stable from previous study. Small amount of soft tissue adjacent to the inferior margin of the right hepatic lobe measures 1.5 x 0.6 cm, image 37/series 2. This is compared with 2.3 x 0.8 cm previously. No new or enlarging peritoneal implants identified.   Cycle XX  administered 09/06/2012.    Lab Results  Component Value Date   CA125 48.4* 08/30/2012     Past Medical History  Diagnosis Date  . Anxiety   . Depression   . Diabetes mellitus     BORDERLINE  . Hypertension   . History of irritable  bowel syndrome   . Hx of colonoscopy 0/2010    BY DR. MANN  . Ovarian cancer 04-02-2009    IIIC  . Gastric reflux     Past Surgical History  Procedure Laterality Date  . Vaginal hysterectomy  1980  . Tubial ligation    . Tubal ligation  1963    BILATERAL  . Cholecystectomy      DONE ~ 2007   .  REVIEW OF SYSTEMS: CONSTITUTIONAL: No nausea, vomiting, fever, chills, no change in vision, no icterus, no headache, shortness of breath, or adenopathy. No abdominal bloating, no anorexia and change in appetite, no weight loss. No bloating. GI: No  diarrhea, constipation, hematuria, hematochezia, and hemat occult.  No swelling of the lower extremities. Otherwise, 10 system review is negative.    PHYSICAL EXAMINATION: GENERAL: Well-developed female in no acute  distress.  VITALS: BP 100/62  Pulse 68  Temp 97.7 F (36.5 C) (Oral)  Resp 20  Ht 5' 3.98" (1.625 m)  Wt 186 lb (84.369 kg)  BMI 31.95 kg/m2 CHEST: Clear to auscultation.  HEART: Regular rate and rhythm.  ABDOMEN: Soft, nontender, and obese. No palpable masses, no ascitic fluid wave PELVIC EXAMINATION: Normal external genitalia; Bartholin's, urethral,  and Skene's. No masses in the vagina, cul-de-sac, or pelvis.  RECTAL:  Fair tone, no masses EXTREMITIES: No clubbing, cyanosis, or edema.

## 2012-09-13 ENCOUNTER — Other Ambulatory Visit: Payer: Self-pay | Admitting: Oncology

## 2012-09-13 ENCOUNTER — Encounter: Payer: Self-pay | Admitting: Gynecologic Oncology

## 2012-09-13 ENCOUNTER — Ambulatory Visit: Payer: Medicare Other | Attending: Gynecologic Oncology | Admitting: Gynecologic Oncology

## 2012-09-13 VITALS — BP 110/60 | HR 68 | Temp 98.2°F | Resp 16

## 2012-09-13 DIAGNOSIS — Z79899 Other long term (current) drug therapy: Secondary | ICD-10-CM | POA: Insufficient documentation

## 2012-09-13 DIAGNOSIS — Z9079 Acquired absence of other genital organ(s): Secondary | ICD-10-CM | POA: Insufficient documentation

## 2012-09-13 DIAGNOSIS — C569 Malignant neoplasm of unspecified ovary: Secondary | ICD-10-CM

## 2012-09-13 DIAGNOSIS — I10 Essential (primary) hypertension: Secondary | ICD-10-CM | POA: Insufficient documentation

## 2012-09-13 DIAGNOSIS — Z90710 Acquired absence of both cervix and uterus: Secondary | ICD-10-CM | POA: Insufficient documentation

## 2012-09-13 DIAGNOSIS — Z9089 Acquired absence of other organs: Secondary | ICD-10-CM | POA: Insufficient documentation

## 2012-09-13 NOTE — Progress Notes (Signed)
REASON FOR VISIT: Recurrent  stage 3C ovarian cancer.    ASSESSMENT/PLAN : Jaclyn [name] is a 72 y.o.-year-old with recurrent endometroid ovarian cancer, currently on single agent taxol.   HISTORY OF PRESENT ILLNESS: This is a 72 year old with remote history of a vaginal hysterectomy and right salpingo-oophorectomy completed in 1988, pelvic mass was noted in January 2012. 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 She received 6 cycles of Taxol and carboplatin therapy with persistent elevations in her CA-125. Treatment was complicated by anemia and requirement for multiple blood transfusions. Her CA-125 in September 2011, remained minimally elevated to 40. On May 16, 2010  a trial with tamoxifen was undertaken given her poor tolerance of chemotherapy. Her CA-125 nadir was 20, the trend in Ca125  Has been progressively increasing to a value of 71.8.  PT  CT abd/pelvis  06/2101 without any evidence of disease.  Recurrent endometrial cancer diagnosed at the time of presentation of SBO.  Patient has received single agent taxol with good response.  A CA 125 for the last 2 values has been approximately 48. CT scan 07/26/2012 notable findings There is no free fluid or abnormal fluid collection identified within the abdomen or pelvis. Calcified peritoneal implant is noted within the right pericolic gutter. This is stable from previous study. Small amount of soft tissue adjacent to the inferior margin of the right hepatic lobe measures 1.5 x 0.6 cm, image 37/series 2. This is compared with 2.3 x 0.8 cm previously. No new or enlarging peritoneal implants identified.   Single agent Taxol at 80 mg per meter squared has been administered 19 times.  The last treatment was  09/06/2012.  Based on the CA 125 and the patient's tolerance of Taxol I would recommend continuing this  agent since it appears that the disease is stable. I do recall that her CA 125 very slowly rose while on tamoxifen and if there is a point where she is no longer tolerant of Taxol strong consideration could be given for letrozole which could be better tolerated.  At this time she's able to complete all of her activities of daily living she is happy in adult daycare and feels better than she has in 18 years.    Lab Results  Component Value Date   CA125 48.4* 08/30/2012     Past Medical History  Diagnosis Date  . Anxiety   . Depression   . Diabetes mellitus     BORDERLINE  . Hypertension   . History of irritable bowel syndrome   . Hx of colonoscopy 0/2010    BY DR. MANN  . Ovarian cancer 04-02-2009    IIIC  . Gastric reflux     Past Surgical History  Procedure Laterality Date  . Vaginal hysterectomy  1980  . Tubial ligation    . Tubal ligation  1963    BILATERAL  . Cholecystectomy      DONE ~ 2007   .  REVIEW OF SYSTEMS: CONSTITUTIONAL: No nausea, vomiting, fever, chills, no change in vision, no icterus, no headache, shortness of breath, or adenopathy. No abdominal bloating, no anorexia and change in appetite, no weight loss. No bloating. GI: No diarrhea, constipation, hematuria, hematochezia, and hemat occult.  No swelling of the lower extremities. Patient states she feels better than she has in the past 18 years she is able to complete all of her activities of daily living. 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  Otherwise 10 point review of systems within normal limits  PHYSICAL EXAMINATION: GENERAL: Well-developed female in no acute  distress.  VITALS: BP 110/60  Pulse 68  Temp(Src) 98.2 F (36.8 C) (Oral)  Resp 16 CHEST: Clear to auscultation.  HEART: Regular rate and rhythm.  ABDOMEN: Soft, nontender, and obese. No palpable masses, no ascitic fluid wave PELVIC EXAMINATION: Normal  external genitalia; Bartholin's, urethrae and Skene's. No masses in the vagina, cul-de-sac, or pelvis.  RECTAL:  Fair tone, no masses EXTREMITIES: No clubbing, cyanosis, or edema.

## 2012-09-14 ENCOUNTER — Telehealth: Payer: Self-pay | Admitting: Oncology

## 2012-09-14 ENCOUNTER — Telehealth: Payer: Self-pay | Admitting: *Deleted

## 2012-09-14 NOTE — Telephone Encounter (Signed)
Per staff message and POF I have scheduled appts. Had to move appt from 7/1 to 7/2 due to the holiday. Scheduler notified.  JMW

## 2012-09-14 NOTE — Telephone Encounter (Signed)
lvm for pt regarding to 7.2.14 appt and advised to pick up sched at nxt visit

## 2012-09-15 NOTE — Telephone Encounter (Signed)
Patient called back and confirmed appts for 09/21/12.

## 2012-09-20 ENCOUNTER — Other Ambulatory Visit: Payer: Medicare Other | Admitting: Lab

## 2012-09-21 ENCOUNTER — Telehealth: Payer: Self-pay | Admitting: Oncology

## 2012-09-21 ENCOUNTER — Other Ambulatory Visit (HOSPITAL_BASED_OUTPATIENT_CLINIC_OR_DEPARTMENT_OTHER): Payer: Medicare Other | Admitting: Lab

## 2012-09-21 ENCOUNTER — Ambulatory Visit (HOSPITAL_BASED_OUTPATIENT_CLINIC_OR_DEPARTMENT_OTHER): Payer: Medicare Other

## 2012-09-21 VITALS — BP 149/88 | HR 84 | Temp 98.4°F | Resp 18

## 2012-09-21 DIAGNOSIS — Z5111 Encounter for antineoplastic chemotherapy: Secondary | ICD-10-CM

## 2012-09-21 DIAGNOSIS — C569 Malignant neoplasm of unspecified ovary: Secondary | ICD-10-CM

## 2012-09-21 LAB — CBC WITH DIFFERENTIAL/PLATELET
Basophils Absolute: 0 10*3/uL (ref 0.0–0.1)
EOS%: 0 % (ref 0.0–7.0)
Eosinophils Absolute: 0 10*3/uL (ref 0.0–0.5)
HGB: 11.2 g/dL — ABNORMAL LOW (ref 11.6–15.9)
MONO%: 1.6 % (ref 0.0–14.0)
NEUT#: 6.8 10*3/uL — ABNORMAL HIGH (ref 1.5–6.5)
RBC: 3.96 10*6/uL (ref 3.70–5.45)
RDW: 16.3 % — ABNORMAL HIGH (ref 11.2–14.5)
lymph#: 1.4 10*3/uL (ref 0.9–3.3)

## 2012-09-21 MED ORDER — SODIUM CHLORIDE 0.9 % IJ SOLN
10.0000 mL | INTRAMUSCULAR | Status: DC | PRN
  Administered 2012-09-21: 10 mL
  Filled 2012-09-21: qty 10

## 2012-09-21 MED ORDER — ONDANSETRON 8 MG/50ML IVPB (CHCC)
8.0000 mg | Freq: Once | INTRAVENOUS | Status: AC
  Administered 2012-09-21: 8 mg via INTRAVENOUS

## 2012-09-21 MED ORDER — SODIUM CHLORIDE 0.9 % IV SOLN
Freq: Once | INTRAVENOUS | Status: AC
  Administered 2012-09-21: 10:00:00 via INTRAVENOUS

## 2012-09-21 MED ORDER — FAMOTIDINE IN NACL 20-0.9 MG/50ML-% IV SOLN
20.0000 mg | Freq: Once | INTRAVENOUS | Status: AC
  Administered 2012-09-21: 20 mg via INTRAVENOUS

## 2012-09-21 MED ORDER — DIPHENHYDRAMINE HCL 50 MG/ML IJ SOLN
50.0000 mg | Freq: Once | INTRAMUSCULAR | Status: AC
  Administered 2012-09-21: 50 mg via INTRAVENOUS

## 2012-09-21 MED ORDER — HEPARIN SOD (PORK) LOCK FLUSH 100 UNIT/ML IV SOLN
500.0000 [IU] | Freq: Once | INTRAVENOUS | Status: AC | PRN
  Administered 2012-09-21: 500 [IU]
  Filled 2012-09-21: qty 5

## 2012-09-21 MED ORDER — DEXAMETHASONE SODIUM PHOSPHATE 20 MG/5ML IJ SOLN
20.0000 mg | Freq: Once | INTRAMUSCULAR | Status: AC
  Administered 2012-09-21: 20 mg via INTRAVENOUS

## 2012-09-21 MED ORDER — SODIUM CHLORIDE 0.9 % IV SOLN
80.0000 mg/m2 | Freq: Once | INTRAVENOUS | Status: AC
  Administered 2012-09-21: 156 mg via INTRAVENOUS
  Filled 2012-09-21: qty 26

## 2012-09-21 NOTE — Patient Instructions (Addendum)
[facility] Discharge Instructions for Patients Receiving Chemotherapy  Today you received the following chemotherapy agents: Taxol.  To help prevent nausea and vomiting after your treatment, we encourage you to take your nausea medication as prescribed.   If you develop nausea and vomiting that is not controlled by your nausea medication, call the clinic.   BELOW ARE SYMPTOMS THAT SHOULD BE REPORTED IMMEDIATELY:  *FEVER GREATER THAN 100.5 F  *CHILLS WITH OR WITHOUT FEVER  NAUSEA AND VOMITING THAT IS NOT CONTROLLED WITH YOUR NAUSEA MEDICATION  *UNUSUAL SHORTNESS OF BREATH  *UNUSUAL BRUISING OR BLEEDING  TENDERNESS IN MOUTH AND THROAT WITH OR WITHOUT PRESENCE OF ULCERS  *URINARY PROBLEMS  *BOWEL PROBLEMS  UNUSUAL RASH Items with * indicate a potential emergency and should be followed up as soon as possible.  Feel free to call the clinic you have any questions or concerns. The clinic phone number is [phone].    

## 2012-09-21 NOTE — Telephone Encounter (Signed)
Gave pt appt for lab, Md and chemo for July 2014 , gave pt appt with Dr. Nelly Rout

## 2012-09-22 ENCOUNTER — Ambulatory Visit: Payer: Medicare Other | Admitting: Gynecologic Oncology

## 2012-09-27 ENCOUNTER — Other Ambulatory Visit (HOSPITAL_BASED_OUTPATIENT_CLINIC_OR_DEPARTMENT_OTHER): Payer: Medicare Other | Admitting: Lab

## 2012-09-27 ENCOUNTER — Ambulatory Visit (HOSPITAL_BASED_OUTPATIENT_CLINIC_OR_DEPARTMENT_OTHER): Payer: Medicare Other

## 2012-09-27 VITALS — BP 145/82 | HR 86 | Temp 98.1°F | Resp 20

## 2012-09-27 DIAGNOSIS — C569 Malignant neoplasm of unspecified ovary: Secondary | ICD-10-CM

## 2012-09-27 DIAGNOSIS — Z5111 Encounter for antineoplastic chemotherapy: Secondary | ICD-10-CM

## 2012-09-27 LAB — COMPREHENSIVE METABOLIC PANEL (CC13)
Alkaline Phosphatase: 70 U/L (ref 40–150)
Creatinine: 0.9 mg/dL (ref 0.6–1.1)
Glucose: 128 mg/dl (ref 70–140)
Sodium: 136 mEq/L (ref 136–145)
Total Bilirubin: 0.31 mg/dL (ref 0.20–1.20)
Total Protein: 7.7 g/dL (ref 6.4–8.3)

## 2012-09-27 LAB — CBC WITH DIFFERENTIAL/PLATELET
BASO%: 0 % (ref 0.0–2.0)
EOS%: 0 % (ref 0.0–7.0)
HCT: 33.3 % — ABNORMAL LOW (ref 34.8–46.6)
MCH: 28.2 pg (ref 25.1–34.0)
MCHC: 32.7 g/dL (ref 31.5–36.0)
NEUT%: 80.7 % — ABNORMAL HIGH (ref 38.4–76.8)
RDW: 16.4 % — ABNORMAL HIGH (ref 11.2–14.5)
lymph#: 1 10*3/uL (ref 0.9–3.3)

## 2012-09-27 MED ORDER — FAMOTIDINE IN NACL 20-0.9 MG/50ML-% IV SOLN
20.0000 mg | Freq: Once | INTRAVENOUS | Status: AC
  Administered 2012-09-27: 20 mg via INTRAVENOUS

## 2012-09-27 MED ORDER — SODIUM CHLORIDE 0.9 % IJ SOLN
10.0000 mL | INTRAMUSCULAR | Status: DC | PRN
  Administered 2012-09-27: 10 mL
  Filled 2012-09-27: qty 10

## 2012-09-27 MED ORDER — SODIUM CHLORIDE 0.9 % IV SOLN
Freq: Once | INTRAVENOUS | Status: AC
  Administered 2012-09-27: 15:00:00 via INTRAVENOUS

## 2012-09-27 MED ORDER — DIPHENHYDRAMINE HCL 50 MG/ML IJ SOLN
50.0000 mg | Freq: Once | INTRAMUSCULAR | Status: AC
  Administered 2012-09-27: 50 mg via INTRAVENOUS

## 2012-09-27 MED ORDER — HEPARIN SOD (PORK) LOCK FLUSH 100 UNIT/ML IV SOLN
500.0000 [IU] | Freq: Once | INTRAVENOUS | Status: AC | PRN
  Administered 2012-09-27: 500 [IU]
  Filled 2012-09-27: qty 5

## 2012-09-27 MED ORDER — DEXAMETHASONE SODIUM PHOSPHATE 20 MG/5ML IJ SOLN
20.0000 mg | Freq: Once | INTRAMUSCULAR | Status: AC
  Administered 2012-09-27: 20 mg via INTRAVENOUS

## 2012-09-27 MED ORDER — SODIUM CHLORIDE 0.9 % IV SOLN
80.0000 mg/m2 | Freq: Once | INTRAVENOUS | Status: AC
  Administered 2012-09-27: 156 mg via INTRAVENOUS
  Filled 2012-09-27: qty 26

## 2012-09-27 MED ORDER — ONDANSETRON 8 MG/50ML IVPB (CHCC)
8.0000 mg | Freq: Once | INTRAVENOUS | Status: AC
  Administered 2012-09-27: 8 mg via INTRAVENOUS

## 2012-09-27 NOTE — Patient Instructions (Addendum)
[facility] Discharge Instructions for Patients Receiving Chemotherapy  Today you received the following chemotherapy agents: Taxol.  To help prevent nausea and vomiting after your treatment, we encourage you to take your nausea medication, Zofran. Take it every eight hours as needed.   If you develop nausea and vomiting that is not controlled by your nausea medication, call the clinic.   BELOW ARE SYMPTOMS THAT SHOULD BE REPORTED IMMEDIATELY:  *FEVER GREATER THAN 100.5 F  *CHILLS WITH OR WITHOUT FEVER  NAUSEA AND VOMITING THAT IS NOT CONTROLLED WITH YOUR NAUSEA MEDICATION  *UNUSUAL SHORTNESS OF BREATH  *UNUSUAL BRUISING OR BLEEDING  TENDERNESS IN MOUTH AND THROAT WITH OR WITHOUT PRESENCE OF ULCERS  *URINARY PROBLEMS  *BOWEL PROBLEMS  UNUSUAL RASH Items with * indicate a potential emergency and should be followed up as soon as possible.  Feel free to call the clinic should you have any questions or concerns. The clinic phone number is [phone].

## 2012-09-28 ENCOUNTER — Other Ambulatory Visit: Payer: Self-pay | Admitting: Certified Registered Nurse Anesthetist

## 2012-10-11 ENCOUNTER — Ambulatory Visit (HOSPITAL_BASED_OUTPATIENT_CLINIC_OR_DEPARTMENT_OTHER): Payer: Medicare Other

## 2012-10-11 ENCOUNTER — Other Ambulatory Visit (HOSPITAL_BASED_OUTPATIENT_CLINIC_OR_DEPARTMENT_OTHER): Payer: Medicare Other | Admitting: Lab

## 2012-10-11 ENCOUNTER — Other Ambulatory Visit: Payer: Self-pay

## 2012-10-11 ENCOUNTER — Ambulatory Visit (HOSPITAL_BASED_OUTPATIENT_CLINIC_OR_DEPARTMENT_OTHER): Payer: Medicare Other | Admitting: Hematology and Oncology

## 2012-10-11 VITALS — BP 173/87 | HR 87 | Temp 97.1°F | Resp 19 | Ht 64.0 in | Wt 191.7 lb

## 2012-10-11 DIAGNOSIS — C569 Malignant neoplasm of unspecified ovary: Secondary | ICD-10-CM

## 2012-10-11 DIAGNOSIS — C786 Secondary malignant neoplasm of retroperitoneum and peritoneum: Secondary | ICD-10-CM

## 2012-10-11 DIAGNOSIS — Z5111 Encounter for antineoplastic chemotherapy: Secondary | ICD-10-CM

## 2012-10-11 LAB — CBC WITH DIFFERENTIAL/PLATELET
Basophils Absolute: 0 10*3/uL (ref 0.0–0.1)
Eosinophils Absolute: 0 10*3/uL (ref 0.0–0.5)
LYMPH%: 21 % (ref 14.0–49.7)
MCV: 86.8 fL (ref 79.5–101.0)
MONO%: 1.7 % (ref 0.0–14.0)
NEUT#: 5.5 10*3/uL (ref 1.5–6.5)
Platelets: 266 10*3/uL (ref 145–400)
RBC: 3.85 10*6/uL (ref 3.70–5.45)
nRBC: 0 % (ref 0–0)

## 2012-10-11 LAB — COMPREHENSIVE METABOLIC PANEL (CC13)
ALT: 12 U/L (ref 0–55)
AST: 15 U/L (ref 5–34)
Alkaline Phosphatase: 91 U/L (ref 40–150)
CO2: 24 mEq/L (ref 22–29)
Creatinine: 0.9 mg/dL (ref 0.6–1.1)
Sodium: 139 mEq/L (ref 136–145)
Total Bilirubin: 0.28 mg/dL (ref 0.20–1.20)
Total Protein: 7.8 g/dL (ref 6.4–8.3)

## 2012-10-11 MED ORDER — SODIUM CHLORIDE 0.9 % IV SOLN
Freq: Once | INTRAVENOUS | Status: AC
  Administered 2012-10-11: 12:00:00 via INTRAVENOUS

## 2012-10-11 MED ORDER — SODIUM CHLORIDE 0.9 % IV SOLN
50.0000 mg | Freq: Once | INTRAVENOUS | Status: AC
  Administered 2012-10-11: 50 mg via INTRAVENOUS
  Filled 2012-10-11: qty 2

## 2012-10-11 MED ORDER — DIPHENHYDRAMINE HCL 50 MG/ML IJ SOLN
50.0000 mg | Freq: Once | INTRAMUSCULAR | Status: AC
  Administered 2012-10-11: 50 mg via INTRAVENOUS

## 2012-10-11 MED ORDER — PACLITAXEL CHEMO INJECTION 300 MG/50ML
80.0000 mg/m2 | Freq: Once | INTRAVENOUS | Status: AC
  Administered 2012-10-11: 156 mg via INTRAVENOUS
  Filled 2012-10-11: qty 26

## 2012-10-11 MED ORDER — DEXAMETHASONE SODIUM PHOSPHATE 20 MG/5ML IJ SOLN
20.0000 mg | Freq: Once | INTRAMUSCULAR | Status: AC
  Administered 2012-10-11: 20 mg via INTRAVENOUS

## 2012-10-11 MED ORDER — HEPARIN SOD (PORK) LOCK FLUSH 100 UNIT/ML IV SOLN
500.0000 [IU] | Freq: Once | INTRAVENOUS | Status: AC | PRN
  Administered 2012-10-11: 500 [IU]
  Filled 2012-10-11: qty 5

## 2012-10-11 MED ORDER — ONDANSETRON 8 MG/50ML IVPB (CHCC)
8.0000 mg | Freq: Once | INTRAVENOUS | Status: AC
  Administered 2012-10-11: 8 mg via INTRAVENOUS

## 2012-10-11 MED ORDER — SODIUM CHLORIDE 0.9 % IJ SOLN
10.0000 mL | INTRAMUSCULAR | Status: DC | PRN
  Administered 2012-10-11: 10 mL
  Filled 2012-10-11: qty 10

## 2012-10-11 MED ORDER — DEXAMETHASONE 4 MG PO TABS: ORAL_TABLET | ORAL | Status: DC

## 2012-10-11 MED ORDER — FAMOTIDINE IN NACL 20-0.9 MG/50ML-% IV SOLN: 20.0000 mg | Freq: Once | INTRAVENOUS | Status: DC

## 2012-10-11 NOTE — Patient Instructions (Addendum)
[facility] Discharge Instructions for Patients Receiving Chemotherapy  Today you received the following chemotherapy agents Taxol To help prevent nausea and vomiting after your treatment, we encourage you to take your nausea medication as prescribed.  If you develop nausea and vomiting that is not controlled by your nausea medication, call the clinic.   BELOW ARE SYMPTOMS THAT SHOULD BE REPORTED IMMEDIATELY:  *FEVER GREATER THAN 100.5 F  *CHILLS WITH OR WITHOUT FEVER  NAUSEA AND VOMITING THAT IS NOT CONTROLLED WITH YOUR NAUSEA MEDICATION  *UNUSUAL SHORTNESS OF BREATH  *UNUSUAL BRUISING OR BLEEDING  TENDERNESS IN MOUTH AND THROAT WITH OR WITHOUT PRESENCE OF ULCERS  *URINARY PROBLEMS  *BOWEL PROBLEMS  UNUSUAL RASH Items with * indicate a potential emergency and should be followed up as soon as possible.  Feel free to call the clinic you have any questions or concerns. The clinic phone number is [phone].      Paclitaxel injection What is this medicine? PACLITAXEL (PAK li TAX el) is a chemotherapy drug. It targets fast dividing cells, like cancer cells, and causes these cells to die. This medicine is used to treat ovarian cancer, breast cancer, and other cancers. This medicine may be used for other purposes; ask your health care provider or pharmacist if you have questions. What should I tell my health care provider before I take this medicine? They need to know if you have any of these conditions: -blood disorders -irregular heartbeat -infection (especially a virus infection such as chickenpox, cold sores, or herpes) -liver disease -previous or ongoing radiation therapy -an unusual or allergic reaction to paclitaxel, alcohol, polyoxyethylated castor oil, other chemotherapy agents, other medicines, foods, dyes, or preservatives -pregnant or trying to get pregnant -breast-feeding How should I use this medicine? This drug is given as an infusion into a  vein. It is administered in a hospital or clinic by a specially trained health care professional. Talk to your pediatrician regarding the use of this medicine in children. Special care may be needed. Overdosage: If you think you have taken too much of this medicine contact a poison control center or emergency room at once. NOTE: This medicine is only for you. Do not share this medicine with others. What if I miss a dose? It is important not to miss your dose. Call your doctor or health care professional if you are unable to keep an appointment. What may interact with this medicine? Do not take this medicine with any of the following medications: -disulfiram -metronidazole This medicine may also interact with the following medications: -cyclosporine -dexamethasone -diazepam -ketoconazole -medicines to increase blood counts like filgrastim, pegfilgrastim, sargramostim -other chemotherapy drugs like cisplatin, doxorubicin, epirubicin, etoposide, teniposide, vincristine -quinidine -testosterone -vaccines -verapamil Talk to your doctor or health care professional before taking any of these medicines: -acetaminophen -aspirin -ibuprofen -ketoprofen -naproxen This list may not describe all possible interactions. Give your health care provider a list of all the medicines, herbs, non-prescription drugs, or dietary supplements you use. Also tell them if you smoke, drink alcohol, or use illegal drugs. Some items may interact with your medicine. What should I watch for while using this medicine? Your condition will be monitored carefully while you are receiving this medicine. You will need important blood work done while you are taking this medicine. This drug may make you feel generally unwell. This is not uncommon, as chemotherapy can affect healthy cells as well as cancer cells. Report any side effects. Continue your course of treatment even though you   feel ill unless your doctor tells you to  stop. In some cases, you may be given additional medicines to help with side effects. Follow all directions for their use. Call your doctor or health care professional for advice if you get a fever, chills or sore throat, or other symptoms of a cold or flu. Do not treat yourself. This drug decreases your body's ability to fight infections. Try to avoid being around people who are sick. This medicine may increase your risk to bruise or bleed. Call your doctor or health care professional if you notice any unusual bleeding. Be careful brushing and flossing your teeth or using a toothpick because you may get an infection or bleed more easily. If you have any dental work done, tell your dentist you are receiving this medicine. Avoid taking products that contain aspirin, acetaminophen, ibuprofen, naproxen, or ketoprofen unless instructed by your doctor. These medicines may hide a fever. Do not become pregnant while taking this medicine. Women should inform their doctor if they wish to become pregnant or think they might be pregnant. There is a potential for serious side effects to an unborn child. Talk to your health care professional or pharmacist for more information. Do not breast-feed an infant while taking this medicine. Men are advised not to father a child while receiving this medicine. What side effects may I notice from receiving this medicine? Side effects that you should report to your doctor or health care professional as soon as possible: -allergic reactions like skin rash, itching or hives, swelling of the face, lips, or tongue -low blood counts - This drug may decrease the number of white blood cells, red blood cells and platelets. You may be at increased risk for infections and bleeding. -signs of infection - fever or chills, cough, sore throat, pain or difficulty passing urine -signs of decreased platelets or bleeding - bruising, pinpoint red spots on the skin, black, tarry stools,  nosebleeds -signs of decreased red blood cells - unusually weak or tired, fainting spells, lightheadedness -breathing problems -chest pain -high or low blood pressure -mouth sores -nausea and vomiting -pain, swelling, redness or irritation at the injection site -pain, tingling, numbness in the hands or feet -slow or irregular heartbeat -swelling of the ankle, feet, hands Side effects that usually do not require medical attention (report to your doctor or health care professional if they continue or are bothersome): -bone pain -complete hair loss including hair on your head, underarms, pubic hair, eyebrows, and eyelashes -changes in the color of fingernails -diarrhea -loosening of the fingernails -loss of appetite -muscle or joint pain -red flush to skin -sweating This list may not describe all possible side effects. Call your doctor for medical advice about side effects. You may report side effects to FDA at 1-800-FDA-1088. Where should I keep my medicine? This drug is given in a hospital or clinic and will not be stored at home. NOTE: This sheet is a summary. It may not cover all possible information. If you have questions about this medicine, talk to your doctor, pharmacist, or health care provider.  2013, [employer]. (02/20/2008 11:54:26 AM)  

## 2012-10-11 NOTE — Progress Notes (Signed)
OFFICE PROGRESS NOTE   10/11/2012   Physicians:W.Brewster, V.Bland, J.Mann, K.Reddy   INTERVAL HISTORY:   Patient is seen, together with daughter, in continuing attention to recurrent ovarian cancer, doing well on single agent taxol which she has received beginning 03-21-13, day 1 & 8 every 21 days. She visited   Dr Nelly Rout on 09-13-12, her recommendation noted. She has tolerated the taxol very well. Last CT on 07-26-12 had improvement in area of right hepatic lobe and no recurrent ascites.   She has PAC in, will be flushed with treatment on 09-06-12.  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 She had optimal debulking by Dr.Brewster, 10 liters of ascites at that procedure. She was treated with 8 cycles of taxol/carboplatin thru Aug. 2011, with CA125 still ~ 40 at completion of the chemotherapy. The tumor was ER + at 100% and she was maintained on tamoxifen from Oct 2011 until Nov 2013. CA125 nadir was 20 in April 2012, with gradual rise subsequently, tho patient remained asymptomatic until SBO in 01-2012. Scans in 01-2012 showed moderShe was treated with single agent taxol day 1 day 8 q 21 days from 03-21-13 x 12 cycles, thru 07-12-12. CA 125 was 88 on 03-08-12, 53 in Feb and 50 in April.  Recent change in psychiatric medication, which has been very helpful "I have not felt this good in years"; she will call RN back with new medication. No abdominal or pelvic discomfort, no vominting, good appetite and tolerating regular diet with no difficulty. Bowels moving regularly. No bleeding. No SOB, no fever, no problems with PAC. Only occasional and transient mild numbness hands or feet, not bothersome. Remainder of 10 point Review of Systems negative.  Patient states that she has Living Will and HCPOA papers completed "I will bring them to my new doctor next time".  Objective:  Vital signs in  last 24 hours:  BP 173/87  Pulse 87  Temp(Src) 97.1 F (36.2 C) (Oral)  Resp 19  Ht 5\' 4"  (1.626 m)  Wt 191 lb 11.2 oz (86.955 kg)  BMI 32.89 kg/m2  Weight is up 1 lb. Easily ambulatory, looks comfortable, very cheerful.  HEENT:PERRLA, sclera clear, anicteric and oropharynx clear, no lesions Edentulous with plates. LymphaticsCervical, supraclavicular, and axillary nodes normal. Resp: clear to auscultation bilaterally and normal percussion bilaterally Cardio: regular rate and rhythm GI: soft, not tender, not distended. Normally active BS. No appreciable HSM or mass Extremities: extremities normal, atraumatic, no cyanosis or edema Neuro:no sensory deficits noted Skin without rash or ecchymosis Portacath -without erythema or tenderness  Lab Results:  Results for orders placed in visit on 10/11/12  CBC WITH DIFFERENTIAL      Result Value Range   WBC 7.2  3.9 - 10.3 10e3/uL   NEUT# 5.5  1.5 - 6.5 10e3/uL   HGB 10.8 (*) 11.6 - 15.9 g/dL   HCT 16.1 (*) 09.6 - 04.5 %   Platelets 266  145 - 400 10e3/uL   MCV 86.8  79.5 - 101.0 fL   MCH 28.1  25.1 - 34.0 pg   MCHC 32.3  31.5 - 36.0 g/dL   RBC 4.09  8.11 - 9.14 10e6/uL   RDW 16.8 (*) 11.2 - 14.5 %   lymph# 1.5  0.9 - 3.3 10e3/uL   MONO# 0.1  0.1 - 0.9 10e3/uL   Eosinophils Absolute 0.0  0.0 - 0.5 10e3/uL   Basophils Absolute 0.0  0.0 - 0.1 10e3/uL   NEUT% 77.1 (*) 38.4 - 76.8 %   LYMPH% 21.0  14.0 - 49.7 %   MONO% 1.7  0.0 - 14.0 %   EOS% 0.1  0.0 - 7.0 %   BASO% 0.1  0.0 - 2.0 %   nRBC 0  0 - 0 %    CMET available after visit normal with exception of glucose 115  CA125 available after visit is 48, stable from late April.  Studies/Results:  No results found.  Medications: I have reviewed the patient's current medications.  Assessment/Plan: 1.Recurrent ovarian carcinoma: no problems with SBO since Nov 2013 and clinically has improved with the low dose taxol( D1,8 every 21 days). She will have treatment today 10/11/12  and again on  10-18-12 then  11/01/2012, then will see Dr Nelly Rout sometime in August 2014. Note clinically stable for extended time on tamoxifen previously,   2.PAC in  3.multifactorial anemia: hgb stable today, on oral iron. Declined epogen. Has needed PRBCs with prior chemo, note family (not patient) are Jehov[employer]'s Witnesses  4.Marland KitchenHTN : followed by Dr Rennis Golden  5.history of anxiety/ depression for which she is followed by psychiatry  6.Advance Directives completed and she will bring at next visit.  Depending on recommendations from gyn oncology, she may need additional visits set up with medical oncology. Note she will need PAC flushed every 6-8 weeks when not otherwise used.   Brynn Mulgrew E, MD   10/11/2012, 10:00 AM

## 2012-10-14 ENCOUNTER — Telehealth: Payer: Self-pay | Admitting: *Deleted

## 2012-10-14 NOTE — Telephone Encounter (Signed)
i tried to call the pt and advise her about her appts scheduled for 8/12, 8/19, and 8/25. However the line was busy. i will place a letter/avs in the mail today...td

## 2012-10-18 ENCOUNTER — Other Ambulatory Visit: Payer: Self-pay | Admitting: Hematology and Oncology

## 2012-10-18 ENCOUNTER — Telehealth: Payer: Self-pay | Admitting: *Deleted

## 2012-10-18 ENCOUNTER — Other Ambulatory Visit (HOSPITAL_BASED_OUTPATIENT_CLINIC_OR_DEPARTMENT_OTHER): Payer: Medicare Other | Admitting: Lab

## 2012-10-18 ENCOUNTER — Ambulatory Visit (HOSPITAL_BASED_OUTPATIENT_CLINIC_OR_DEPARTMENT_OTHER): Payer: Medicare Other

## 2012-10-18 VITALS — BP 151/72 | HR 91 | Temp 98.2°F

## 2012-10-18 DIAGNOSIS — C569 Malignant neoplasm of unspecified ovary: Secondary | ICD-10-CM

## 2012-10-18 DIAGNOSIS — Z5111 Encounter for antineoplastic chemotherapy: Secondary | ICD-10-CM

## 2012-10-18 LAB — CBC WITH DIFFERENTIAL/PLATELET
BASO%: 0.2 % (ref 0.0–2.0)
EOS%: 0 % (ref 0.0–7.0)
MCH: 28.1 pg (ref 25.1–34.0)
MCHC: 32.5 g/dL (ref 31.5–36.0)
RBC: 3.85 10*6/uL (ref 3.70–5.45)
RDW: 17 % — ABNORMAL HIGH (ref 11.2–14.5)
lymph#: 1.2 10*3/uL (ref 0.9–3.3)

## 2012-10-18 MED ORDER — SODIUM CHLORIDE 0.9 % IV SOLN
Freq: Once | INTRAVENOUS | Status: AC
  Administered 2012-10-18: 14:00:00 via INTRAVENOUS

## 2012-10-18 MED ORDER — HEPARIN SOD (PORK) LOCK FLUSH 100 UNIT/ML IV SOLN
500.0000 [IU] | Freq: Once | INTRAVENOUS | Status: AC | PRN
  Administered 2012-10-18: 500 [IU]
  Filled 2012-10-18: qty 5

## 2012-10-18 MED ORDER — PACLITAXEL CHEMO INJECTION 300 MG/50ML
80.0000 mg/m2 | Freq: Once | INTRAVENOUS | Status: AC
  Administered 2012-10-18: 156 mg via INTRAVENOUS
  Filled 2012-10-18: qty 26

## 2012-10-18 MED ORDER — DEXAMETHASONE SODIUM PHOSPHATE 20 MG/5ML IJ SOLN
20.0000 mg | Freq: Once | INTRAMUSCULAR | Status: AC
  Administered 2012-10-18: 20 mg via INTRAVENOUS

## 2012-10-18 MED ORDER — ONDANSETRON 8 MG/50ML IVPB (CHCC)
8.0000 mg | Freq: Once | INTRAVENOUS | Status: AC
  Administered 2012-10-18: 8 mg via INTRAVENOUS

## 2012-10-18 MED ORDER — FAMOTIDINE IN NACL 20-0.9 MG/50ML-% IV SOLN
20.0000 mg | Freq: Once | INTRAVENOUS | Status: AC
  Administered 2012-10-18: 20 mg via INTRAVENOUS

## 2012-10-18 MED ORDER — SODIUM CHLORIDE 0.9 % IJ SOLN
10.0000 mL | INTRAMUSCULAR | Status: DC | PRN
  Administered 2012-10-18: 10 mL
  Filled 2012-10-18: qty 10

## 2012-10-18 MED ORDER — DIPHENHYDRAMINE HCL 50 MG/ML IJ SOLN
50.0000 mg | Freq: Once | INTRAMUSCULAR | Status: AC
  Administered 2012-10-18: 50 mg via INTRAVENOUS

## 2012-10-18 NOTE — Patient Instructions (Addendum)
[facility] Discharge Instructions for Patients Receiving Chemotherapy  Today you received the following chemotherapy agents Taxol  To help prevent nausea and vomiting after your treatment, we encourage you to take your nausea medication    If you develop nausea and vomiting that is not controlled by your nausea medication, call the clinic.   BELOW ARE SYMPTOMS THAT SHOULD BE REPORTED IMMEDIATELY:  *FEVER GREATER THAN 100.5 F  *CHILLS WITH OR WITHOUT FEVER  NAUSEA AND VOMITING THAT IS NOT CONTROLLED WITH YOUR NAUSEA MEDICATION  *UNUSUAL SHORTNESS OF BREATH  *UNUSUAL BRUISING OR BLEEDING  TENDERNESS IN MOUTH AND THROAT WITH OR WITHOUT PRESENCE OF ULCERS  *URINARY PROBLEMS  *BOWEL PROBLEMS  UNUSUAL RASH Items with * indicate a potential emergency and should be followed up as soon as possible.  Feel free to call the clinic you have any questions or concerns. The clinic phone number is [phone].    

## 2012-10-18 NOTE — Telephone Encounter (Signed)
[place] called asking for results of tuberculin test patient reports was done recently at this office which was negative.  Checked lab results as far back as April 2012 and no results noted.  [place] notified.

## 2012-10-26 ENCOUNTER — Other Ambulatory Visit: Payer: Self-pay

## 2012-10-31 ENCOUNTER — Other Ambulatory Visit: Payer: Self-pay | Admitting: Hematology and Oncology

## 2012-10-31 DIAGNOSIS — E876 Hypokalemia: Secondary | ICD-10-CM

## 2012-10-31 DIAGNOSIS — D6481 Anemia due to antineoplastic chemotherapy: Secondary | ICD-10-CM

## 2012-10-31 DIAGNOSIS — C569 Malignant neoplasm of unspecified ovary: Secondary | ICD-10-CM

## 2012-10-31 DIAGNOSIS — C786 Secondary malignant neoplasm of retroperitoneum and peritoneum: Secondary | ICD-10-CM

## 2012-11-01 ENCOUNTER — Other Ambulatory Visit (HOSPITAL_COMMUNITY): Payer: Self-pay | Admitting: Internal Medicine

## 2012-11-01 ENCOUNTER — Ambulatory Visit (HOSPITAL_BASED_OUTPATIENT_CLINIC_OR_DEPARTMENT_OTHER): Payer: Medicare Other

## 2012-11-01 ENCOUNTER — Other Ambulatory Visit: Payer: Self-pay | Admitting: Hematology and Oncology

## 2012-11-01 ENCOUNTER — Other Ambulatory Visit (HOSPITAL_BASED_OUTPATIENT_CLINIC_OR_DEPARTMENT_OTHER): Payer: Medicare Other | Admitting: Lab

## 2012-11-01 VITALS — BP 165/89 | HR 86 | Temp 98.3°F | Resp 16

## 2012-11-01 DIAGNOSIS — E876 Hypokalemia: Secondary | ICD-10-CM

## 2012-11-01 DIAGNOSIS — C786 Secondary malignant neoplasm of retroperitoneum and peritoneum: Secondary | ICD-10-CM

## 2012-11-01 DIAGNOSIS — D6481 Anemia due to antineoplastic chemotherapy: Secondary | ICD-10-CM

## 2012-11-01 DIAGNOSIS — C569 Malignant neoplasm of unspecified ovary: Secondary | ICD-10-CM

## 2012-11-01 DIAGNOSIS — Z5111 Encounter for antineoplastic chemotherapy: Secondary | ICD-10-CM

## 2012-11-01 LAB — CBC WITH DIFFERENTIAL/PLATELET
BASO%: 0.1 % (ref 0.0–2.0)
Basophils Absolute: 0 10e3/uL (ref 0.0–0.1)
EOS%: 0 % (ref 0.0–7.0)
Eosinophils Absolute: 0 10e3/uL (ref 0.0–0.5)
HCT: 32.9 % — ABNORMAL LOW (ref 34.8–46.6)
HGB: 10.8 g/dL — ABNORMAL LOW (ref 11.6–15.9)
LYMPH%: 18.9 % (ref 14.0–49.7)
MCH: 28.1 pg (ref 25.1–34.0)
MCHC: 32.8 g/dL (ref 31.5–36.0)
MCV: 85.7 fL (ref 79.5–101.0)
MONO#: 0.2 10e3/uL (ref 0.1–0.9)
MONO%: 2.1 % (ref 0.0–14.0)
NEUT#: 5.6 10e3/uL (ref 1.5–6.5)
NEUT%: 78.9 % — ABNORMAL HIGH (ref 38.4–76.8)
Platelets: 260 10e3/uL (ref 145–400)
RBC: 3.84 10e6/uL (ref 3.70–5.45)
RDW: 16.9 % — ABNORMAL HIGH (ref 11.2–14.5)
WBC: 7.2 10e3/uL (ref 3.9–10.3)
lymph#: 1.4 10e3/uL (ref 0.9–3.3)
nRBC: 0 % (ref 0–0)

## 2012-11-01 LAB — COMPREHENSIVE METABOLIC PANEL (CC13)
ALT: 10 U/L (ref 0–55)
AST: 15 U/L (ref 5–34)
Albumin: 3.6 g/dL (ref 3.5–5.0)
Alkaline Phosphatase: 89 U/L (ref 40–150)
BUN: 13.4 mg/dL (ref 7.0–26.0)
CO2: 23 meq/L (ref 22–29)
Calcium: 9.9 mg/dL (ref 8.4–10.4)
Chloride: 107 meq/L (ref 98–109)
Creatinine: 1 mg/dL (ref 0.6–1.1)
Glucose: 125 mg/dL (ref 70–140)
Potassium: 4.4 meq/L (ref 3.5–5.1)
Sodium: 140 meq/L (ref 136–145)
Total Bilirubin: 0.29 mg/dL (ref 0.20–1.20)
Total Protein: 7.9 g/dL (ref 6.4–8.3)

## 2012-11-01 MED ORDER — DIPHENHYDRAMINE HCL 50 MG/ML IJ SOLN
50.0000 mg | Freq: Once | INTRAMUSCULAR | Status: AC
  Administered 2012-11-01: 50 mg via INTRAVENOUS

## 2012-11-01 MED ORDER — SODIUM CHLORIDE 0.9 % IV SOLN
Freq: Once | INTRAVENOUS | Status: AC
  Administered 2012-11-01: 14:00:00 via INTRAVENOUS

## 2012-11-01 MED ORDER — SODIUM CHLORIDE 0.9 % IJ SOLN
10.0000 mL | INTRAMUSCULAR | Status: DC | PRN
  Administered 2012-11-01: 10 mL
  Filled 2012-11-01: qty 10

## 2012-11-01 MED ORDER — PACLITAXEL CHEMO INJECTION 300 MG/50ML
80.0000 mg/m2 | Freq: Once | INTRAVENOUS | Status: AC
  Administered 2012-11-01: 156 mg via INTRAVENOUS
  Filled 2012-11-01: qty 26

## 2012-11-01 MED ORDER — FAMOTIDINE IN NACL 20-0.9 MG/50ML-% IV SOLN
20.0000 mg | Freq: Once | INTRAVENOUS | Status: AC
  Administered 2012-11-01: 20 mg via INTRAVENOUS

## 2012-11-01 MED ORDER — HEPARIN SOD (PORK) LOCK FLUSH 100 UNIT/ML IV SOLN
500.0000 [IU] | Freq: Once | INTRAVENOUS | Status: AC | PRN
  Administered 2012-11-01: 500 [IU]
  Filled 2012-11-01: qty 5

## 2012-11-01 MED ORDER — DEXAMETHASONE SODIUM PHOSPHATE 20 MG/5ML IJ SOLN
20.0000 mg | Freq: Once | INTRAMUSCULAR | Status: AC
  Administered 2012-11-01: 20 mg via INTRAVENOUS

## 2012-11-01 MED ORDER — ONDANSETRON 8 MG/50ML IVPB (CHCC)
8.0000 mg | Freq: Once | INTRAVENOUS | Status: AC
  Administered 2012-11-01: 8 mg via INTRAVENOUS

## 2012-11-01 NOTE — Telephone Encounter (Signed)
Rx was sent to pharmacy electronically. 

## 2012-11-01 NOTE — Patient Instructions (Signed)
[facility] Discharge Instructions for Patients Receiving Chemotherapy  Today you received the following chemotherapy agents TAXOL  To help prevent nausea and vomiting after your treatment, we encourage you to take your nausea medication IF NEEDED   If you develop nausea and vomiting that is not controlled by your nausea medication, call the clinic.   BELOW ARE SYMPTOMS THAT SHOULD BE REPORTED IMMEDIATELY:  *FEVER GREATER THAN 100.5 F  *CHILLS WITH OR WITHOUT FEVER  NAUSEA AND VOMITING THAT IS NOT CONTROLLED WITH YOUR NAUSEA MEDICATION  *UNUSUAL SHORTNESS OF BREATH  *UNUSUAL BRUISING OR BLEEDING  TENDERNESS IN MOUTH AND THROAT WITH OR WITHOUT PRESENCE OF ULCERS  *URINARY PROBLEMS  *BOWEL PROBLEMS  UNUSUAL RASH Items with * indicate a potential emergency and should be followed up as soon as possible.  Feel free to call the clinic you have any questions or concerns. The clinic phone number is [phone].    

## 2012-11-02 ENCOUNTER — Other Ambulatory Visit (HOSPITAL_COMMUNITY): Payer: Self-pay | Admitting: Family Medicine

## 2012-11-02 DIAGNOSIS — Z1231 Encounter for screening mammogram for malignant neoplasm of breast: Secondary | ICD-10-CM

## 2012-11-08 ENCOUNTER — Other Ambulatory Visit (HOSPITAL_BASED_OUTPATIENT_CLINIC_OR_DEPARTMENT_OTHER): Payer: Medicare Other | Admitting: Lab

## 2012-11-08 ENCOUNTER — Other Ambulatory Visit: Payer: Self-pay | Admitting: Oncology

## 2012-11-08 ENCOUNTER — Ambulatory Visit (HOSPITAL_BASED_OUTPATIENT_CLINIC_OR_DEPARTMENT_OTHER): Payer: Medicare Other

## 2012-11-08 VITALS — BP 167/71 | HR 89 | Temp 97.0°F | Resp 20

## 2012-11-08 DIAGNOSIS — C569 Malignant neoplasm of unspecified ovary: Secondary | ICD-10-CM

## 2012-11-08 DIAGNOSIS — D6481 Anemia due to antineoplastic chemotherapy: Secondary | ICD-10-CM

## 2012-11-08 DIAGNOSIS — Z5111 Encounter for antineoplastic chemotherapy: Secondary | ICD-10-CM

## 2012-11-08 DIAGNOSIS — C786 Secondary malignant neoplasm of retroperitoneum and peritoneum: Secondary | ICD-10-CM

## 2012-11-08 LAB — CBC WITH DIFFERENTIAL/PLATELET
Eosinophils Absolute: 0 10*3/uL (ref 0.0–0.5)
HCT: 32.1 % — ABNORMAL LOW (ref 34.8–46.6)
LYMPH%: 22 % (ref 14.0–49.7)
MCHC: 32.7 g/dL (ref 31.5–36.0)
MCV: 86.1 fL (ref 79.5–101.0)
MONO%: 0.6 % (ref 0.0–14.0)
NEUT#: 4 10*3/uL (ref 1.5–6.5)
NEUT%: 77.2 % — ABNORMAL HIGH (ref 38.4–76.8)
Platelets: 254 10*3/uL (ref 145–400)
RBC: 3.73 10*6/uL (ref 3.70–5.45)
nRBC: 0 % (ref 0–0)

## 2012-11-08 MED ORDER — SODIUM CHLORIDE 0.9 % IV SOLN
Freq: Once | INTRAVENOUS | Status: AC
  Administered 2012-11-08: 14:00:00 via INTRAVENOUS

## 2012-11-08 MED ORDER — HEPARIN SOD (PORK) LOCK FLUSH 100 UNIT/ML IV SOLN
500.0000 [IU] | Freq: Once | INTRAVENOUS | Status: AC | PRN
  Administered 2012-11-08: 500 [IU]
  Filled 2012-11-08: qty 5

## 2012-11-08 MED ORDER — DEXAMETHASONE SODIUM PHOSPHATE 20 MG/5ML IJ SOLN
20.0000 mg | Freq: Once | INTRAMUSCULAR | Status: AC
  Administered 2012-11-08: 20 mg via INTRAVENOUS

## 2012-11-08 MED ORDER — DIPHENHYDRAMINE HCL 50 MG/ML IJ SOLN
50.0000 mg | Freq: Once | INTRAMUSCULAR | Status: AC
  Administered 2012-11-08: 50 mg via INTRAVENOUS

## 2012-11-08 MED ORDER — SODIUM CHLORIDE 0.9 % IJ SOLN
10.0000 mL | INTRAMUSCULAR | Status: DC | PRN
  Administered 2012-11-08: 10 mL
  Filled 2012-11-08: qty 10

## 2012-11-08 MED ORDER — PACLITAXEL CHEMO INJECTION 300 MG/50ML
80.0000 mg/m2 | Freq: Once | INTRAVENOUS | Status: AC
  Administered 2012-11-08: 156 mg via INTRAVENOUS
  Filled 2012-11-08: qty 26

## 2012-11-08 MED ORDER — FAMOTIDINE IN NACL 20-0.9 MG/50ML-% IV SOLN
20.0000 mg | Freq: Once | INTRAVENOUS | Status: AC
  Administered 2012-11-08: 20 mg via INTRAVENOUS

## 2012-11-08 MED ORDER — ONDANSETRON 8 MG/50ML IVPB (CHCC)
8.0000 mg | Freq: Once | INTRAVENOUS | Status: AC
  Administered 2012-11-08: 8 mg via INTRAVENOUS

## 2012-11-08 NOTE — Patient Instructions (Addendum)
[facility] Discharge Instructions for Patients Receiving Chemotherapy  Today you received the following chemotherapy agents: Taxol  To help prevent nausea and vomiting after your treatment, we encourage you to take your nausea medication as directed by your physician.    If you develop nausea and vomiting that is not controlled by your nausea medication, call the clinic.   BELOW ARE SYMPTOMS THAT SHOULD BE REPORTED IMMEDIATELY:  *FEVER GREATER THAN 100.5 F  *CHILLS WITH OR WITHOUT FEVER  NAUSEA AND VOMITING THAT IS NOT CONTROLLED WITH YOUR NAUSEA MEDICATION  *UNUSUAL SHORTNESS OF BREATH  *UNUSUAL BRUISING OR BLEEDING  TENDERNESS IN MOUTH AND THROAT WITH OR WITHOUT PRESENCE OF ULCERS  *URINARY PROBLEMS  *BOWEL PROBLEMS  UNUSUAL RASH Items with * indicate a potential emergency and should be followed up as soon as possible.  Feel free to call the clinic you have any questions or concerns. The clinic phone number is [phone].    

## 2012-11-14 ENCOUNTER — Other Ambulatory Visit: Payer: Medicare Other | Admitting: Lab

## 2012-11-14 ENCOUNTER — Other Ambulatory Visit (HOSPITAL_BASED_OUTPATIENT_CLINIC_OR_DEPARTMENT_OTHER): Payer: Medicare Other | Admitting: Lab

## 2012-11-14 ENCOUNTER — Ambulatory Visit: Payer: Medicare Other

## 2012-11-14 ENCOUNTER — Ambulatory Visit: Payer: Medicare Other | Attending: Oncology | Admitting: Oncology

## 2012-11-14 VITALS — BP 157/69 | HR 86 | Temp 97.5°F | Resp 20 | Wt 187.2 lb

## 2012-11-14 DIAGNOSIS — G620 Drug-induced polyneuropathy: Secondary | ICD-10-CM | POA: Insufficient documentation

## 2012-11-14 DIAGNOSIS — C569 Malignant neoplasm of unspecified ovary: Secondary | ICD-10-CM | POA: Insufficient documentation

## 2012-11-14 DIAGNOSIS — D649 Anemia, unspecified: Secondary | ICD-10-CM | POA: Insufficient documentation

## 2012-11-14 DIAGNOSIS — Z79899 Other long term (current) drug therapy: Secondary | ICD-10-CM | POA: Insufficient documentation

## 2012-11-14 DIAGNOSIS — I10 Essential (primary) hypertension: Secondary | ICD-10-CM | POA: Insufficient documentation

## 2012-11-14 DIAGNOSIS — H9190 Unspecified hearing loss, unspecified ear: Secondary | ICD-10-CM | POA: Insufficient documentation

## 2012-11-14 DIAGNOSIS — T451X5A Adverse effect of antineoplastic and immunosuppressive drugs, initial encounter: Secondary | ICD-10-CM | POA: Insufficient documentation

## 2012-11-14 DIAGNOSIS — R7309 Other abnormal glucose: Secondary | ICD-10-CM | POA: Insufficient documentation

## 2012-11-14 DIAGNOSIS — F411 Generalized anxiety disorder: Secondary | ICD-10-CM | POA: Insufficient documentation

## 2012-11-14 DIAGNOSIS — F3289 Other specified depressive episodes: Secondary | ICD-10-CM | POA: Insufficient documentation

## 2012-11-14 DIAGNOSIS — F329 Major depressive disorder, single episode, unspecified: Secondary | ICD-10-CM | POA: Insufficient documentation

## 2012-11-14 LAB — CBC WITH DIFFERENTIAL/PLATELET
Basophils Absolute: 0 10*3/uL (ref 0.0–0.1)
Eosinophils Absolute: 0 10*3/uL (ref 0.0–0.5)
HCT: 32.4 % — ABNORMAL LOW (ref 34.8–46.6)
HGB: 10.6 g/dL — ABNORMAL LOW (ref 11.6–15.9)
LYMPH%: 29.7 % (ref 14.0–49.7)
MCV: 86.4 fL (ref 79.5–101.0)
MONO#: 0 10*3/uL — ABNORMAL LOW (ref 0.1–0.9)
MONO%: 0.6 % (ref 0.0–14.0)
NEUT#: 2.3 10*3/uL (ref 1.5–6.5)
NEUT%: 69.7 % (ref 38.4–76.8)
Platelets: 270 10*3/uL (ref 145–400)

## 2012-11-15 ENCOUNTER — Ambulatory Visit: Payer: Medicare Other | Admitting: Gynecologic Oncology

## 2012-11-15 ENCOUNTER — Encounter: Payer: Self-pay | Admitting: Oncology

## 2012-11-15 ENCOUNTER — Encounter: Payer: Self-pay | Admitting: Gynecologic Oncology

## 2012-11-15 VITALS — BP 140/73 | HR 82 | Temp 98.4°F | Resp 18 | Ht 63.0 in | Wt 188.9 lb

## 2012-11-15 DIAGNOSIS — C569 Malignant neoplasm of unspecified ovary: Secondary | ICD-10-CM

## 2012-11-15 NOTE — Progress Notes (Signed)
OFFICE PROGRESS NOTE   11/14/2012  Physicians:W.Brewster, V.Bland, J.Mann, K.Reddy   INTERVAL HISTORY:  Patient is seen, alone for visit, in continuing attention to recurrent ovarian cancer for which she has received taxol days 1 & 8 every 21 days from 03-21-2012 thru 11-08-2012. CA 125 has gradually continued to improve, from 88 in Dec 2013 down to 32 in July 2014. 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  Last scans were CT AP 07-26-12. She is to see Dr Nelly Rout tomorrow and does want to keep that appointment. She has PAC in, flushed with treatment 11-08-12.   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 She had optimal debulking by Dr.Brewster, 10 liters of ascites at that procedure. She was treated with 8 cycles of taxol/carboplatin thru Aug. 2011, with CA125 still ~ 40 at completion of the chemotherapy. The tumor was ER + at 100% and she was maintained on tamoxifen from Oct 2011 until Nov 2013. CA125 nadir was 20 in April 2012, with gradual rise subsequently, tho patient remained asymptomatic until SBO in 01-2012. Scans in 01-2012 showed moderate ascites and new peritoneal nodularity. She began single agent taxol day 1 day 8 q 21 days 03-21-13 x 12 cycles, thru 07-12-12. CA 125 was 88 on 03-08-12, 53 in Feb, 50 in April, 32 in July.   Review of systems as above, also: No nausea or vomiting. No abdominal pain or swelling. Good appetite and good energy level. No problems with PAC. Bowels moving daily without medication. No LE swelling. No respiratory symptoms. No pain. Has been more active at adult day care, including regular walks.  Remainder of 10 point Review of Systems negative.  Objective:  Vital signs in last 24 hours:  BP 157/69  Pulse 86  Temp(Src)  97.5 F (36.4 C)  Resp 20  Wt 187 lb 3 oz (84.908 kg)  BMI 32.11 kg/m2  Alert, oriented and appropriate. Easily ambulatory, looks comfortable, very pleasant and talkative. Wearing wig  HEENT:PERRL, sclerae not icteric. Oral mucosa moist without lesions, posterior pharynx clear. Neck supple. No JVD. No apparent thyroid mass. Lymphatics no cervical,suraclavicular, axillary or inguinal adenopathy Resp: clear to auscultation bilaterally and normal percussion bilaterally Back not tender Cardio: regular rate and rhythm. No gallop. GI: soft, nontender, not clearly distended, no mass or organomegaly. Active BS. Surgical incision not remarkable. Extremities: without pitting edema, cords, tenderness. Toenails slightly ingrown first toes without inflammation Neuro: slight peripheral neuropathy left fingertips, otherwise nonfocal Skin without rash, ecchymosis, petechiae Breasts: without dominant mass, skin or nipple findings. Axilae benign. Portacath-without erythema or tenderness  Lab Results:  Results for orders placed in visit on 11/14/12  CBC WITH DIFFERENTIAL      Result Value Range   WBC 3.2 (*) 3.9 - 10.3 10e3/uL   NEUT# 2.3  1.5 - 6.5 10e3/uL   HGB 10.6 (*) 11.6 - 15.9 g/dL   HCT 60.4 (*) 54.0 - 98.1 %   Platelets 270  145 - 400 10e3/uL   MCV 86.4  79.5 - 101.0 fL   MCH 28.3  25.1 - 34.0 pg   MCHC 32.7  31.5 - 36.0 g/dL   RBC 1.91  4.78 - 2.95 10e6/uL   RDW 17.7 (*) 11.2 - 14.5 %   lymph# 1.0  0.9 - 3.3 10e3/uL  MONO# 0.0 (*) 0.1 - 0.9 10e3/uL   Eosinophils Absolute 0.0  0.0 - 0.5 10e3/uL   Basophils Absolute 0.0  0.0 - 0.1 10e3/uL   NEUT% 69.7  38.4 - 76.8 %   LYMPH% 29.7  14.0 - 49.7 %   MONO% 0.6  0.0 - 14.0 %   EOS% 0.0  0.0 - 7.0 %   BASO% 0.0  0.0 - 2.0 %   nRBC 0  0 - 0 %    Last ca 125 from 09-27-12 31.9  Studies/Results:  Mammograms scheduled later this week  Medications: I have reviewed the patient's current medications.  Assessment/Plan: 1.recurrent  ovarian carcinoma: no problems with SBO since Nov 2013 and clinically has improved with the low dose taxol. She will see Dr Nelly Rout tomorrow and we will set up further appointments pending her recommendations.   Note clinically stable for extended time on tamoxifen previously, ? if aromatase inhibitor or other hormonal intervention might be reasonable again after chemo. 2.PAC in  3.multifactorial anemia: hgb just slightly lower today, asymptomatic, on oral iron. Declined epogen. Has needed PRBCs with prior combination chemo, note family (not patient) are Jehovah's Witnesses  4.Marland KitchenHTN : followed by Dr Rennis Golden  5.history of anxiety/ depression for which she is followed by psychiatry, doing well on present medication  6.Advance Directives in place    Lennix Kneisel P, MD   11/15/2012, 6:25 AM

## 2012-11-15 NOTE — Progress Notes (Signed)
REASON FOR VISIT: Recurrent  stage 3C ovarian cancer.    ASSESSMENT/PLAN : Ms. Abbett is a 72 y.o.  with recurrent endometroid ovarian cancer, currently on single agent taxol. Her CA 125 has normalized since 09/27/2012. A recommendation would be for management with continuous letrozole. 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 This may be a good time to stop Taxol.  Device her to followup with her cardiologist to assess the hearing loss and to followup with Dr. Darrold Span  Followup with GYN oncology in 3 months.   HISTORY OF PRESENT ILLNESS: This is a 71 y.o. with remote history of a vaginal hysterectomy and right salpingo-oophorectomy completed in 1988, pelvic mass was noted in January 2012. 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 She received 6 cycles of Taxol and carboplatin therapy with persistent elevations in her CA-125. Treatment was complicated by anemia and requirement for multiple blood transfusions. Her CA-125 in September 2011, remained minimally elevated to 40. On May 16, 2010  a trial with tamoxifen was undertaken given her poor tolerance of chemotherapy. Her CA-125 nadir was 20, the trend in Ca125  Has been progressively increasing to a value of 71.8.  PT  CT abd/pelvis  06/2101 without any evidence of disease.  Recurrent endometrial cancer diagnosed at the time of presentation of SBO.  Patient has received single agent taxol with good response.  A CA 125 for the last 2 values has been approximately 48. CT scan 07/26/2012 notable findings There is no free fluid or abnormal fluid collection identified within the abdomen or pelvis. Calcified peritoneal implant is noted within the right pericolic gutter. This is stable from previous study. Small amount of soft tissue adjacent to the inferior margin of the right hepatic lobe  measures 1.5 x 0.6 cm, image 37/series 2. This is compared with 2.3 x 0.8 cm previously. No new or enlarging peritoneal implants identified.   Single agent Taxol at 80 mg per meter squared has been administered 22 times.  The last treatment was  11/08/2012.  Lab Results  Component Value Date   CA125 31.9* 09/27/2012   Ms. Kinner had a significant period of time with disease control on tamoxifen. She subsequently progressed through this agent however I think that this consideration would be for the use of letrozole.    Past Medical History  Diagnosis Date  . Anxiety   . Depression   . Diabetes mellitus     BORDERLINE  . Hypertension   . History of irritable bowel syndrome   . Hx of colonoscopy 0/2010    BY DR. MANN  . Ovarian cancer 04-02-2009    IIIC  . Gastric reflux     Past Surgical History  Procedure Laterality Date  . Vaginal hysterectomy  1980  . Tubial ligation    . Tubal ligation  1963    BILATERAL  . Cholecystectomy      DONE ~ 2007   .  REVIEW OF SYSTEMS: CONSTITUTIONAL: No nausea, vomiting, fever, chills, no change in vision, no icterus, no headache, shortness of breath, or adenopathy. No abdominal bloating, no anorexia and change in appetite, no weight loss. No bloating. GI: No diarrhea, constipation, hematuria, hematochezia, and hemat occult.  No swelling of the lower extremities.  She reports mild neuropathy in fingers and toes she denies  falls and states it she's able to use her fingers without difficulty.  Reports hearing loss the patient's daughter  says that the television is very loud and that her mother speaks loudly Otherwise 10 point review of systems within normal limits  PHYSICAL EXAMINATION: GENERAL: Well-developed female in no acute  distress.  VITALS: BP 140/73  Pulse 82  Temp(Src) 98.4 F (36.9 C) (Oral)  Resp 18  Ht 5\' 3"  (1.6 m)  Wt 188 lb 14.4 oz (85.684 kg)  BMI 33.47 kg/m2 CHEST: Clear to auscultation.  HEART: Regular rate and rhythm.   ABDOMEN: Soft, nontender, and obese. No palpable masses, no ascitic fluid wave PELVIC EXAMINATION: Normal external genitalia; Bartholin's, urethrae and Skene's. No masses in the vagina, cul-de-sac, or pelvis.  RECTAL:  Fair tone, no masses EXTREMITIES: No clubbing, cyanosis, or edema.

## 2012-11-15 NOTE — Patient Instructions (Signed)
We will schedule an appointment with the genetics counselor.  Plan to follow up with Dr. Darrold Span and Dr. Nelly Rout in 3 months.

## 2012-11-16 ENCOUNTER — Other Ambulatory Visit: Payer: Self-pay | Admitting: Oncology

## 2012-11-16 DIAGNOSIS — C569 Malignant neoplasm of unspecified ovary: Secondary | ICD-10-CM

## 2012-11-17 ENCOUNTER — Telehealth: Payer: Self-pay | Admitting: Genetic Counselor

## 2012-11-17 NOTE — Telephone Encounter (Signed)
NOT ABLE TO LEAVE MESSAGE

## 2012-11-18 ENCOUNTER — Telehealth: Payer: Self-pay

## 2012-11-18 DIAGNOSIS — C569 Malignant neoplasm of unspecified ovary: Secondary | ICD-10-CM

## 2012-11-18 MED ORDER — LETROZOLE 2.5 MG PO TABS: 2.5000 mg | ORAL_TABLET | Freq: Every day | ORAL | Status: DC

## 2012-11-18 NOTE — Telephone Encounter (Signed)
Called letrozole in as noted below by Dr. Darrold Span.  Will continue to try contacting patient with information noted below.

## 2012-11-18 NOTE — Telephone Encounter (Signed)
Message copied by Lorine Bears on Fri Nov 18, 2012  2:05 PM ------      Message from: Jama Flavors P      Created: Wed Nov 16, 2012 12:42 PM       Dr Nelly Rout saw her on 8-26 and suggests changing treatment from chemo to letrozole now. I cannot tell that this prescription was sent in. Mrs Denardo also has PAC, last flushed 11-08-12.       Please speak with patient and start letrozole 2.5 mg daily #30 2 RF.      I should see her back week of 9-29 or week of 10-6. POF sent to schedulers, so RN please be sure patient knows they will set this up.      thanks ------

## 2012-11-22 NOTE — Telephone Encounter (Signed)
Reached Jaclyn [name] today and told to to begin Femara.  It was called into [facility].  Gave her appointment time for visit with Dr. Darrold Span on 12-23-12.  Pt. Verbalized understanding.  Told Ms. Kenealy to call prior to the visit if she has any trouble with severe aches.  Pt. Verbalized understanding.

## 2012-11-24 ENCOUNTER — Telehealth: Payer: Self-pay | Admitting: Certified Registered Nurse Anesthetist

## 2012-11-24 ENCOUNTER — Telehealth: Payer: Self-pay

## 2012-11-24 NOTE — Telephone Encounter (Signed)
Ms. Jaclyn [name] did not have any more loose stools today.  She took the letrozole with dinner tonight.  She will call if loose stolls occur againg and will discuss with Dr. Darrold Span.

## 2012-11-24 NOTE — Telephone Encounter (Signed)
Call received at 1105hr transferred from switchboard. Pt. Jaclyn [name] informed me that Dr. Darrold Span has prescribed her a "new chemotherapy pill- Letrozole".  She has taken her [name] dose last night and experienced diarrhea this morning. Pt. also reported that her rectum is very sore.  Pt. stated that the medication sheet stated that this pill will cause diarrhea.   Pt. Informed me that she felt like she had to go again and her [name] episode occurred at 9am this morning.  Advice pt. to use vaseline to protect rectal area. Pt. denied eating anything different last night.  Has no immodium at home and stated that can not take immodium. She had taken this medicine last year and her symptoms worsen and ended up in the hospital.  Pt. asked to speak to [facility], Dr. Ferol Luz nurse.  Informed pt. that I will follow up and have a nurse call her back.  Above discussed with Sallye Ober and she will follow up with pt. further.  HL

## 2012-11-29 ENCOUNTER — Telehealth: Payer: Self-pay | Admitting: *Deleted

## 2012-11-29 NOTE — Telephone Encounter (Signed)
sw pt informed her that LL will be out of the office on 12/23/12. gv appt d/t for 12/21/12 w/labs@ 1:15p, flush@1 :30p, and ov@2p . Pt is aware that i will mail a letter/avs...td

## 2012-11-30 ENCOUNTER — Encounter: Payer: Self-pay | Admitting: *Deleted

## 2012-12-01 ENCOUNTER — Telehealth: Payer: Self-pay | Admitting: Oncology

## 2012-12-01 NOTE — Telephone Encounter (Signed)
pt called to r/s appts to 10.8.14 to have copay....done

## 2012-12-05 ENCOUNTER — Ambulatory Visit (HOSPITAL_COMMUNITY)
Admission: RE | Admit: 2012-12-05 | Discharge: 2012-12-05 | Disposition: A | Discharge status: Home / Self Care | Payer: Medicare Other | Source: Ambulatory Visit | Attending: Family Medicine | Admitting: Family Medicine

## 2012-12-05 DIAGNOSIS — Z1231 Encounter for screening mammogram for malignant neoplasm of breast: Secondary | ICD-10-CM | POA: Insufficient documentation

## 2012-12-21 ENCOUNTER — Other Ambulatory Visit: Payer: Medicare Other | Admitting: Lab

## 2012-12-21 ENCOUNTER — Ambulatory Visit: Payer: Medicare Other | Admitting: Oncology

## 2012-12-23 ENCOUNTER — Other Ambulatory Visit: Payer: Medicare Other | Admitting: Lab

## 2012-12-23 ENCOUNTER — Ambulatory Visit: Payer: Medicare Other | Admitting: Oncology

## 2012-12-28 ENCOUNTER — Telehealth: Payer: Self-pay | Admitting: *Deleted

## 2012-12-28 ENCOUNTER — Ambulatory Visit (HOSPITAL_BASED_OUTPATIENT_CLINIC_OR_DEPARTMENT_OTHER): Payer: Medicare Other | Admitting: Oncology

## 2012-12-28 ENCOUNTER — Ambulatory Visit (HOSPITAL_BASED_OUTPATIENT_CLINIC_OR_DEPARTMENT_OTHER): Payer: Medicare Other

## 2012-12-28 ENCOUNTER — Other Ambulatory Visit (HOSPITAL_BASED_OUTPATIENT_CLINIC_OR_DEPARTMENT_OTHER): Payer: Medicare Other | Admitting: Lab

## 2012-12-28 ENCOUNTER — Encounter: Payer: Self-pay | Admitting: Oncology

## 2012-12-28 VITALS — BP 122/72 | HR 97 | Temp 97.5°F | Resp 18 | Ht 63.0 in | Wt 186.1 lb

## 2012-12-28 DIAGNOSIS — C569 Malignant neoplasm of unspecified ovary: Secondary | ICD-10-CM

## 2012-12-28 LAB — COMPREHENSIVE METABOLIC PANEL (CC13)
ALT: 8 U/L (ref 0–55)
Albumin: 3.4 g/dL — ABNORMAL LOW (ref 3.5–5.0)
Anion Gap: 9 mEq/L (ref 3–11)
CO2: 25 mEq/L (ref 22–29)
Chloride: 107 mEq/L (ref 98–109)
Potassium: 4.1 mEq/L (ref 3.5–5.1)
Sodium: 141 mEq/L (ref 136–145)
Total Bilirubin: 0.27 mg/dL (ref 0.20–1.20)
Total Protein: 7.7 g/dL (ref 6.4–8.3)

## 2012-12-28 LAB — CBC WITH DIFFERENTIAL/PLATELET
BASO%: 0.4 % (ref 0.0–2.0)
LYMPH%: 32.5 % (ref 14.0–49.7)
MCHC: 33.1 g/dL (ref 31.5–36.0)
MONO#: 0.5 10*3/uL (ref 0.1–0.9)
NEUT#: 4.8 10*3/uL (ref 1.5–6.5)
RBC: 3.73 10*6/uL (ref 3.70–5.45)
RDW: 16.9 % — ABNORMAL HIGH (ref 11.2–14.5)
WBC: 8.4 10*3/uL (ref 3.9–10.3)
lymph#: 2.7 10*3/uL (ref 0.9–3.3)

## 2012-12-28 MED ORDER — SODIUM CHLORIDE 0.9 % IJ SOLN
10.0000 mL | INTRAMUSCULAR | Status: DC | PRN
  Administered 2012-12-28: 10 mL via INTRAVENOUS
  Filled 2012-12-28: qty 10

## 2012-12-28 MED ORDER — HEPARIN SOD (PORK) LOCK FLUSH 100 UNIT/ML IV SOLN
500.0000 [IU] | Freq: Once | INTRAVENOUS | Status: AC
  Administered 2012-12-28: 500 [IU] via INTRAVENOUS
  Filled 2012-12-28: qty 5

## 2012-12-28 NOTE — Progress Notes (Signed)
OFFICE PROGRESS NOTE   12/28/2012   Physicians:W.Brewster, V.Bland, J.Mann, K.Reddy   INTERVAL HISTORY:  Patient is seen, alone for visit, in continuing attention to history of recurrent ovarian cancer, now on letrozole since late August 2014 which she is tolerating well. She denies abdominal or pelvic discomfort, energy and appetite are good and only occasional, minimal neuropathy symptoms now in hands. She has no other complaints that seem referable to the ovarian cancer or that treatment. She will see Dr Nelly Rout again 02-07-13. Last imaging was CT AP 07-26-12. She has PAC, flushed today.    She is for colonoscopy by Dr Loreta Ave 01-11-13.  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 She had optimal debulking by Dr.Brewster, 10 liters of ascites at that procedure. She was treated with 8 cycles of taxol/carboplatin thru Aug. 2011, with CA125 still ~ 40 at completion of the chemotherapy. The tumor was ER + at 100% and she was maintained on tamoxifen from Oct 2011 until Nov 2013. CA125 nadir was 20 in April 2012, with gradual rise subsequently, tho patient remained asymptomatic until SBO in 01-2012. Scans in 01-2012 showed moderate ascites and new peritoneal nodularity. She received single agent taxol from 03-21-13 , thru 11-08-12. CA 125 was 88 on 03-08-12, 53 in Feb, 50 in April, 32 in July. She started letrozole 11-15-12.   Review of systems as above, also: No fever or recent symptoms of infection. Bowels moving regularly without laxatives. No respiratory complaints. Toenails and heel spur improved with interventions by podiatry.No LE swelling. No bleeding. No arthralgia symptoms since starting letrozole. Remainder of 10 point Review of Systems negative.  She attends adult day care ~ 3 days weekly, tho new location does not have areas for walking.  Objective:  Vital signs in last 24  hours:  BP 122/72  Pulse 97  Temp(Src) 97.5 F (36.4 C) (Oral)  Resp 18  Ht 5\' 3"  (1.6 m)  Wt 186 lb 1.6 oz (84.414 kg)  BMI 32.97 kg/m2 Weight is down ~ 2.5 lbs.  Alert, oriented and appropriate. Affect bright, talkative. Ambulatory without difficulty.  Wears wig.  HEENT:PERRL, sclerae not icteric. Oral mucosa moist without lesions, posterior pharynx clear. Full dentures. Neck supple. No JVD.  Lymphatics:no cervical,suraclavicular, axillary or inguinal adenopathy Resp: clear to auscultation bilaterally and normal percussion bilaterally Cardio: regular rate and rhythm. No gallop. GI: obese, soft, nontender, not distended, no mass or organomegaly. Normally active bowel sounds. Surgical incision not remarkable. Musculoskeletal/ Extremities: without pitting edema, cords, tenderness. Back nontender. Neuro: no peripheral neuropathy. Otherwise nonfocal Skin without rash, ecchymosis, petechiae Breasts: without dominant mass, skin or nipple findings. Axillae benign. Portacath-without erythema or tenderness  Lab Results:  Results for orders placed in visit on 12/28/12  CBC WITH DIFFERENTIAL      Result Value Range   WBC 8.4  3.9 - 10.3 10e3/uL   NEUT# 4.8  1.5 - 6.5 10e3/uL   HGB 10.7 (*) 11.6 - 15.9 g/dL   HCT 65.7 (*) 84.6 - 96.2 %   Platelets 252  145 - 400 10e3/uL   MCV 86.8  79.5 - 101.0 fL   MCH 28.7  25.1 - 34.0 pg   MCHC 33.1  31.5 - 36.0 g/dL   RBC 9.52  8.41 - 3.24 10e6/uL   RDW 16.9 (*) 11.2 - 14.5 %   lymph# 2.7  0.9 - 3.3 10e3/uL   MONO# 0.5  0.1 -  0.9 10e3/uL   Eosinophils Absolute 0.3  0.0 - 0.5 10e3/uL   Basophils Absolute 0.0  0.0 - 0.1 10e3/uL   NEUT% 57.0  38.4 - 76.8 %   LYMPH% 32.5  14.0 - 49.7 %   MONO% 6.5  0.0 - 14.0 %   EOS% 3.6  0.0 - 7.0 %   BASO% 0.4  0.0 - 2.0 %  COMPREHENSIVE METABOLIC PANEL (CC13)      Result Value Range   Sodium 141  136 - 145 mEq/L   Potassium 4.1  3.5 - 5.1 mEq/L   Chloride 107  98 - 109 mEq/L   CO2 25  22 - 29 mEq/L    Glucose 104  70 - 140 mg/dl   BUN 40.9  7.0 - 81.1 mg/dL   Creatinine 1.0  0.6 - 1.1 mg/dL   Total Bilirubin 9.14  0.20 - 1.20 mg/dL   Alkaline Phosphatase 78  40 - 150 U/L   AST 13  5 - 34 U/L   ALT 8  0 - 55 U/L   Total Protein 7.7  6.4 - 8.3 g/dL   Albumin 3.4 (*) 3.5 - 5.0 g/dL   Calcium 9.8  8.4 - 78.2 mg/dL   Anion Gap 9  3 - 11 mEq/L   CA125 available after visit today 39, this 32 in early July, 48 in June, 70 in March 2014   Studies/Results: [facility]IG[facility] SCREENING BILATERAL MAMMOGRAM WITH 3D TOMO WITH CAD  DIGITAL BREAST TOMOSYNTHESIS  Digital breast tomosynthesis images are acquired in two  projections. These images are reviewed in combination with the  digital mammogram, confirming the findings below.  Comparison: Previous exam(s).  FINDINGS:  ACR Breast Density Category b: There are scattered areas of fibroglandular density.  There are no findings suspicious for malignancy.  Mammogram information discussed with patient, who had questions related to written report.  Medications: I have reviewed the patient's current medications. She has had flu vaccine 2 wks ago   Assessment/Plan: 1.recurrent ovarian carcinoma: no problems with bowel obstruction since Nov 2013 and clinically improved with the low dose taxol, now on letrozole which she is tolerating well to this point. (Note clinically stable for extended time on tamoxifen previously) 2.PAC in: will continue to flush ~ every 6-8 weeks, next to coordinate with Dr Forrestine Him visit 02-07-13 3.multifactorial anemia: hgb stable today and not symptomatic. Declined epogen. Has needed PRBCs with prior combination chemo; note family (not patient) are [employer]  4.Marland KitchenHTN : followed by Dr Rennis Golden  5.history of anxiety/ depression for which she is followed by psychiatry, doing well on present medication  6.Advance Directives in place 7.Flu vaccine done elsewhere 8.colonoscopy upcoming  I will see her back  with PAC flush in Jan 2015, labs from [facility] in Nov and Jan (CBC CMET CA125)  Reece Packer, MD   12/28/2012, 3:07 PM

## 2012-12-28 NOTE — Patient Instructions (Signed)
Call if you need anything before next visit.  The portacath needs to be flushed every 6-8 weeks while not being used otherwise.  We will flush it the day that you see Dr Nelly Rout in November.

## 2012-12-28 NOTE — Telephone Encounter (Signed)
appts made and printed. Pt is aware that we will call w/ her appt for LL appt for 2015. Printed order...td

## 2012-12-28 NOTE — Patient Instructions (Signed)
Implanted Port Instructions  An implanted port is a central line that has a round shape and is placed under the skin. It is used for long-term IV (intravenous) access for:  · Medicine.  · Fluids.  · Liquid nutrition, such as TPN (total parenteral nutrition).  · Blood samples.  Ports can be placed:  · In the chest area just below the collarbone (this is the most common place.)  · In the arms.  · In the belly (abdomen) area.  · In the legs.  PARTS OF THE PORT  A port has 2 main parts:  · The reservoir. The reservoir is round, disc-shaped, and will be a small, raised area under your skin.  · The reservoir is the part where a needle is inserted (accessed) to either give medicines or to draw blood.  · The catheter. The catheter is a long, slender tube that extends from the reservoir. The catheter is placed into a large vein.  · Medicine that is inserted into the reservoir goes into the catheter and then into the vein.  INSERTION OF THE PORT  · The port is surgically placed in either an operating room or in a procedural area (interventional radiology).  · Medicine may be given to help you relax during the procedure.  · The skin where the port will be inserted is numbed (local anesthetic).  · 1 or 2 small cuts (incisions) will be made in the skin to insert the port.  · The port can be used after it has been inserted.  INCISION SITE CARE  · The incision site may have small adhesive strips on it. This helps keep the incision site closed. Sometimes, no adhesive strips are placed. Instead of adhesive strips, a special kind of surgical glue is used to keep the incision closed.  · If adhesive strips were placed on the incision sites, do not take them off. They will fall off on their own.  · The incision site may be sore for 1 to 2 days. Pain medicine can help.  · Do not get the incision site wet. Bathe or shower as directed by your caregiver.  · The incision site should heal in 5 to 7 days. A small scar may form after the  incision has healed.  ACCESSING THE PORT  Special steps must be taken to access the port:  · Before the port is accessed, a numbing cream can be placed on the skin. This helps numb the skin over the port site.  · A sterile technique is used to access the port.  · The port is accessed with a needle. Only "non-coring" port needles should be used to access the port. Once the port is accessed, a blood return should be checked. This helps ensure the port is in the vein and is not clogged (clotted).  · If your caregiver believes your port should remain accessed, a clear (transparent) bandage will be placed over the needle site. The bandage and needle will need to be changed every week or as directed by your caregiver.  · Keep the bandage covering the needle clean and dry. Do not get it wet. Follow your caregiver's instructions on how to take a shower or bath when the port is accessed.  · If your port does not need to stay accessed, no bandage is needed over the port.  FLUSHING THE PORT  Flushing the port keeps it from getting clogged. How often the port is flushed depends on:  · If a   constant infusion is running. If a constant infusion is running, the port may not need to be flushed.  · If intermittent medicines are given.  · If the port is not being used.  For intermittent medicines:  · The port will need to be flushed:  · After medicines have been given.  · After blood has been drawn.  · As part of routine maintenance.  · A port is normally flushed with:  · Normal saline.  · Heparin.  · Follow your caregiver's advice on how often, how much, and the type of flush to use on your port.  IMPORTANT PORT INFORMATION  · Tell your caregiver if you are allergic to heparin.  · After your port is placed, you will get a manufacturer's information card. The card has information about your port. Keep this card with you at all times.  · There are many types of ports available. Know what kind of port you have.  · In case of an  emergency, it may be helpful to wear a medical alert bracelet. This can help alert health care workers that you have a port.  · The port can stay in for as long as your caregiver believes it is necessary.  · When it is time for the port to come out, surgery will be done to remove it. The surgery will be similar to how the port was put in.  · If you are in the hospital or clinic:  · Your port will be taken care of and flushed by a nurse.  · If you are at home:  · A home health care nurse may give medicines and take care of the port.  · You or a family member can get special training and directions for giving medicine and taking care of the port at home.  SEEK IMMEDIATE MEDICAL CARE IF:   · Your port does not flush or you are unable to get a blood return.  · New drainage or pus is coming from the incision.  · A bad smell is coming from the incision site.  · You develop swelling or increased redness at the incision site.  · You develop increased swelling or pain at the port site.  · You develop swelling or pain in the surrounding skin near the port.  · You have an oral temperature above 102° F (38.9° C), not controlled by medicine.  MAKE SURE YOU:   · Understand these instructions.  · Will watch your condition.  · Will get help right away if you are not doing well or get worse.  Document Released: 03/09/2005 Document Revised: 06/01/2011 Document Reviewed: 05/31/2008  [employer]® Patient Information ©2014 [employer].

## 2013-01-26 ENCOUNTER — Other Ambulatory Visit: Payer: Self-pay

## 2013-01-31 ENCOUNTER — Ambulatory Visit: Payer: Medicare Other | Admitting: Gynecologic Oncology

## 2013-02-03 ENCOUNTER — Telehealth: Payer: Self-pay | Admitting: *Deleted

## 2013-02-03 NOTE — Telephone Encounter (Signed)
sw pt informed her that her time arrival for 04/05/13 had changed. gv appts for 04/05/13 w/ labs@ 9:45am, flush@ 10am, and ov@ 10:30am. Pt stated that she will get a print out on 02/07/13...td

## 2013-02-07 ENCOUNTER — Ambulatory Visit (HOSPITAL_BASED_OUTPATIENT_CLINIC_OR_DEPARTMENT_OTHER): Payer: Medicare Other

## 2013-02-07 ENCOUNTER — Ambulatory Visit: Payer: Medicare Other | Attending: Gynecologic Oncology | Admitting: Gynecologic Oncology

## 2013-02-07 ENCOUNTER — Other Ambulatory Visit (HOSPITAL_BASED_OUTPATIENT_CLINIC_OR_DEPARTMENT_OTHER): Payer: Medicare Other | Admitting: Lab

## 2013-02-07 ENCOUNTER — Encounter: Payer: Self-pay | Admitting: Gynecologic Oncology

## 2013-02-07 VITALS — BP 144/72 | HR 82 | Temp 97.6°F | Resp 18

## 2013-02-07 VITALS — BP 120/69 | HR 74 | Temp 98.5°F | Resp 16 | Ht 63.0 in | Wt 184.5 lb

## 2013-02-07 DIAGNOSIS — Z9079 Acquired absence of other genital organ(s): Secondary | ICD-10-CM | POA: Insufficient documentation

## 2013-02-07 DIAGNOSIS — G609 Hereditary and idiopathic neuropathy, unspecified: Secondary | ICD-10-CM | POA: Insufficient documentation

## 2013-02-07 DIAGNOSIS — H9190 Unspecified hearing loss, unspecified ear: Secondary | ICD-10-CM | POA: Insufficient documentation

## 2013-02-07 DIAGNOSIS — C569 Malignant neoplasm of unspecified ovary: Secondary | ICD-10-CM | POA: Insufficient documentation

## 2013-02-07 DIAGNOSIS — Z452 Encounter for adjustment and management of vascular access device: Secondary | ICD-10-CM

## 2013-02-07 DIAGNOSIS — Z90710 Acquired absence of both cervix and uterus: Secondary | ICD-10-CM | POA: Insufficient documentation

## 2013-02-07 DIAGNOSIS — Z79899 Other long term (current) drug therapy: Secondary | ICD-10-CM | POA: Insufficient documentation

## 2013-02-07 LAB — CBC WITH DIFFERENTIAL/PLATELET
Basophils Absolute: 0.1 10*3/uL (ref 0.0–0.1)
EOS%: 3.3 % (ref 0.0–7.0)
HGB: 11 g/dL — ABNORMAL LOW (ref 11.6–15.9)
MCH: 28.9 pg (ref 25.1–34.0)
MCV: 86.3 fL (ref 79.5–101.0)
MONO%: 7.1 % (ref 0.0–14.0)
NEUT%: 59.6 % (ref 38.4–76.8)
RDW: 15.3 % — ABNORMAL HIGH (ref 11.2–14.5)

## 2013-02-07 LAB — COMPREHENSIVE METABOLIC PANEL (CC13)
AST: 14 U/L (ref 5–34)
Alkaline Phosphatase: 79 U/L (ref 40–150)
BUN: 12.9 mg/dL (ref 7.0–26.0)
Creatinine: 0.9 mg/dL (ref 0.6–1.1)
Potassium: 4.5 mEq/L (ref 3.5–5.1)

## 2013-02-07 MED ORDER — SODIUM CHLORIDE 0.9 % IJ SOLN
10.0000 mL | INTRAMUSCULAR | Status: AC | PRN
  Administered 2013-02-07: 10 mL via INTRAVENOUS
  Filled 2013-02-07: qty 10

## 2013-02-07 MED ORDER — HEPARIN SOD (PORK) LOCK FLUSH 100 UNIT/ML IV SOLN
500.0000 [IU] | Freq: Once | INTRAVENOUS | Status: AC
  Administered 2013-02-07: 500 [IU] via INTRAVENOUS
  Filled 2013-02-07: qty 5

## 2013-02-07 NOTE — Patient Instructions (Signed)
Followup with Dr. Darrold Span as scheduled 03/2013  Followup with GYN oncology in 6 months.  Continue letrazole   Thank you very much Ms. Jaclyn [name] for allowing me to provide care for you today.  I appreciate your confidence in choosing our Gynecologic Oncology team.  If you have any questions about your visit today please call our office and we will get back to you as soon as possible.  Jaclyn [name]. Jaclyn Wadlow MD., PhD Gynecologic Oncology

## 2013-02-07 NOTE — Progress Notes (Addendum)
Office Visit:  GYN ONCOLOGY   REASON FOR VISIT: Recurrent  stage 3C ovarian cancer.    ASSESSMENT/PLAN : Jaclyn [name] is a 72 y.o.  with recurrent endometroid ovarian cancer, currently on single agent taxol. Her CA 125 has normalized since 09/27/2012. A recommendation would be for management with continuous letrozole. 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 This may be a good time to stop Taxol.  Followup with Dr. Darrold Span 03/2013 as scheduled F/U with Gyn Onc 06/2013  Followup with GYN oncology in 3 months.   HISTORY OF PRESENT ILLNESS: This is a 72 y.o. with remote history of a vaginal hysterectomy and right salpingo-oophorectomy completed in 1988, pelvic mass was noted in January 2012. 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 She received 6 cycles of Taxol and carboplatin therapy with persistent elevations in her CA-125. Treatment was complicated by anemia and requirement for multiple blood transfusions. Her CA-125 in September 2011, remained minimally elevated to 40. On May 16, 2010  a trial with tamoxifen was undertaken given her poor tolerance of chemotherapy. Her CA-125 nadir was 20, the trend in Ca125  Has been progressively increasing to a value of 71.8.  PT  CT abd/pelvis  06/2101 without any evidence of disease.  Recurrent endometrial cancer diagnosed at the time of presentation of SBO.  Patient has received single agent taxol with good response.  A CA 125 for the last 2 values has been approximately 48. CT scan 07/26/2012 notable findings There is no free fluid or abnormal fluid collection identified within the abdomen or pelvis. Calcified peritoneal implant is noted within the right pericolic gutter. This is stable from previous study. Small amount of soft tissue adjacent to the inferior margin of the right hepatic lobe  measures 1.5 x 0.6 cm, image 37/series 2. This is compared with 2.3 x 0.8 cm previously. No new or enlarging peritoneal implants identified.   Single agent Taxol at 80 mg per meter squared has been administered 22 times.  The last treatment was  11/08/2012.  Lab Results  Component Value Date   CA125 39.0* 12/28/2012   Patient is currently on letrozole 2.5 mg daily.  Past Medical History  Diagnosis Date  . Anxiety   . Depression   . Diabetes mellitus     BORDERLINE  . Hypertension   . History of irritable bowel syndrome   . Hx of colonoscopy 0/2010    BY DR. MANN  . Ovarian cancer 04-02-2009    IIIC  . Gastric reflux   Colonoscopy 2014 wnl  Past Surgical History  Procedure Laterality Date  . Vaginal hysterectomy  1980  . Tubial ligation    . Tubal ligation  1963    BILATERAL  . Cholecystectomy      DONE ~ 2007  SOCIAL HISTORY Is not having a big Thanksgiving celebration because her credit cards are extended.  Will spend time in church Thanksgiving day. Marland Kitchen  REVIEW OF SYSTEMS: CONSTITUTIONAL: No nausea, vomiting, fever, chills, no change in vision, no icterus, no headache, shortness of breath, or adenopathy. No abdominal bloating, no anorexia and change in appetite, no weight loss. No bloating. GI: No diarrhea, constipation, hematuria, hematochezia, and hemat occult.  No swelling of the lower extremities.  She reports mild neuropathy in fingers and toes she denies  falls and states it she's able to use her fingers without difficulty.  Feels so well that she feels like  a butterfly.   Otherwise 10 point review of systems within normal limits  PHYSICAL EXAMINATION: GENERAL: Well-developed female in no acute distress.  VITALS:  BP 120/69  Pulse 74  Temp(Src) 98.5 F (36.9 C) (Oral)  Resp 16  Ht 5\' 3"  (1.6 m)  Wt 184 lb 8 oz (83.689 kg)  BMI 32.69 kg/m2 BACK:  No CVAT LN:  No cervical supra clavicular or inguinal adenopathy CHEST: Clear to auscultation.  HEART: Regular rate and  rhythm.  ABDOMEN: Soft, nontender, and obese. No palpable masses, no ascitic fluid wave PELVIC EXAMINATION: Normal external genitalia; Bartholin's, urethrae and Skene's. No masses in the vagina, cul-de-sac, or pelvis.  RECTAL:  Deferred. EXTREMITIES: No clubbing, cyanosis, or edema.

## 2013-02-09 ENCOUNTER — Ambulatory Visit: Payer: Medicare Other | Admitting: Gynecologic Oncology

## 2013-02-22 ENCOUNTER — Encounter: Payer: Self-pay | Admitting: *Deleted

## 2013-02-22 ENCOUNTER — Other Ambulatory Visit: Payer: Self-pay | Admitting: *Deleted

## 2013-02-22 DIAGNOSIS — C569 Malignant neoplasm of unspecified ovary: Secondary | ICD-10-CM

## 2013-02-22 MED ORDER — LETROZOLE 2.5 MG PO TABS: 2.5000 mg | ORAL_TABLET | Freq: Every day | ORAL | Status: DC

## 2013-02-23 ENCOUNTER — Encounter: Payer: Self-pay | Admitting: Oncology

## 2013-02-23 ENCOUNTER — Other Ambulatory Visit: Payer: Self-pay | Admitting: *Deleted

## 2013-02-23 NOTE — Progress Notes (Signed)
I called and spoke with the patient and told her to send me copy of the [facility] bill she received. She said that it is for Jaclyn [name] 12/28/12 132.00. She said she has not had [facility] for several years ago.

## 2013-02-24 ENCOUNTER — Encounter: Payer: Self-pay | Admitting: Internal Medicine

## 2013-02-24 ENCOUNTER — Ambulatory Visit (INDEPENDENT_AMBULATORY_CARE_PROVIDER_SITE_OTHER): Payer: Medicare Other | Admitting: Internal Medicine

## 2013-02-24 VITALS — BP 122/60 | HR 76 | Ht 64.0 in | Wt 185.3 lb

## 2013-02-24 DIAGNOSIS — I10 Essential (primary) hypertension: Secondary | ICD-10-CM

## 2013-02-24 DIAGNOSIS — E785 Hyperlipidemia, unspecified: Secondary | ICD-10-CM

## 2013-02-24 NOTE — Addendum Note (Signed)
Addended by: Chrystie Nose on: 02/24/2013 03:55 PM   Modules accepted: Level of Service

## 2013-02-24 NOTE — Patient Instructions (Signed)
Please have fasting blood work at your earliest convenience.   Your physician wants you to follow-up in: 1 year. You will receive a reminder letter in the mail two months in advance. If you don't receive a letter, please call our office to schedule the follow-up appointment.  

## 2013-02-24 NOTE — Progress Notes (Signed)
OFFICE NOTE  Chief Complaint:  Anxiety, stress  Primary Care Physician: Geraldo Pitter, MD  HPI:  Jaclyn [name]  is a pleasant 72 year old female with a history of ovarian cancer in the past who unfortunately had recurrence and a tumor found in the small intestine. She has been undergoing chemotherapy and recently had worsening shortness of breath and tachycardia. I was concerned about a possible cardiomyopathy and restudied her with echo on June 15, 2012. That echo did show normal systolic function, moderate concentric LVH and EF of 60-65%. There is grade 1 diastolic dysfunction with normal filling pressures. She also has mild mitral regurgitation. I suspect that a lot of her shortness of breath may have been due to the chemotherapy itself or perhaps diastolic dysfunction secondary to that. Today she says that her shortness of breath has improved. Of course, her chemotherapy is now completed and she is awaiting followup imaging tests to see if it has been totally eradicated. She's also been struggling with some anxiety and mood disorder. She was previously on Abilify and was changed to Jordan. She says this medicine is working really well for her.  Her only other request today is that she wishes to be on Vytorin rather than Zetia, because of her stress level. I did tell her that Vytorin is used for cholesterol control, but she still insisted that she should be on that medicine. In addition I mentioned it is a brand-name medicine and would be more expensive. It is also not clear if her cholesterol is still elevated, necessitating a switch to Vytorin.   PMHx:  Past Medical History  Diagnosis Date  . Anxiety   . Depression   . Diabetes mellitus     BORDERLINE  . Hypertension   . History of irritable bowel syndrome   . Hx of colonoscopy 0/2010    BY DR. MANN  . Ovarian cancer 04/02/2009    IIIC - tumor is small intestine  . Gastric reflux   . Dyslipidemia     Past Surgical History    Procedure Laterality Date  . Vaginal hysterectomy  1980  . Tubal ligation Bilateral 1963  . Cholecystectomy  ~ 2007  . Transthoracic echocardiogram  05/2012    EF 60-65%, mod conc hypertrophy, grade 1 diastolic dysfunction; mild MR    FAMHx:  Family History  Problem Relation Age of Onset  . Breast cancer Cousin   . Heart disease Mother   . Stroke Father   . Diabetes Sister   . Lupus Sister   . Heart disease Child     SOCHx:   reports that she has never smoked. She has never used smokeless tobacco. She reports that she does not drink alcohol or use illicit drugs.  ALLERGIES:  Allergies  Allergen Reactions  . Fish Allergy     With mercury   . Lorazepam     " INTOLERANT-2 DAUGHTERS"  . Sulfa Antibiotics     Pt unable to remember reaction    ROS: A comprehensive review of systems was negative except for: Behavioral/Psych: positive for anxiety and mood swings  HOME MEDS: Current Outpatient Prescriptions  Medication Sig Dispense Refill  . aspirin 81 MG tablet Take 81 mg by mouth daily.        . ferrous gluconate (FERGON) 246 (28 FE) MG tablet Take 246 mg by mouth daily with breakfast.        . lidocaine-prilocaine (EMLA) cream Apply 1 application topically as needed. PRIOR TO [facility] ACCESS  1 each  1  . losartan-hydrochlorothiazide (HYZAAR) 100-12.5 MG per tablet Take 0.5 tablets by mouth daily.       . Lurasidone HCl (LATUDA) 60 MG TABS Take 30 mg by mouth daily after supper.      Marland Kitchen omeprazole (PRILOSEC) 20 MG capsule TAKE 2 CAPSULES DAILY  180 capsule  2  . ondansetron (ZOFRAN) 8 MG tablet 8 mg tablets, take 1-2 tablets every 12 hours as needed for nausea  30 tablet  2  . potassium chloride SA (K-DUR,KLOR-CON) 20 MEQ tablet Take 20 mEq by mouth 2 (two) times daily.       . sertraline (ZOLOFT) 25 MG tablet Take 25 mg by mouth daily.        . simvastatin (ZOCOR) 40 MG tablet Take 40 mg by mouth at bedtime.         No current facility-administered medications for this  visit.   Facility-Administered Medications Ordered in Other Visits  Medication Dose Route Frequency Provider Last Rate Last Dose  . sodium chloride 0.9 % injection 10 mL  10 mL Intracatheter PRN Lennis Buzzy Han, MD   10 mL at 09/27/12 1634  . sodium chloride 0.9 % injection 10 mL  10 mL Intravenous PRN Laurette Schimke, MD   10 mL at 02/07/13 4098    LABS/IMAGING: No results found for this or any previous visit (from the past 48 hour(s)). No results found.  VITALS: BP 122/60  Pulse 76  Ht 5\' 4"  (1.626 m)  Wt 185 lb 4.8 oz (84.052 kg)  BMI 31.79 kg/m2  EXAM: General appearance: alert and no distress Neck: no carotid bruit, no JVD and thyroid not enlarged, symmetric, no tenderness/mass/nodules Lungs: clear to auscultation bilaterally Heart: regular rate and rhythm, S1, S2 normal, no murmur, click, rub or gallop Abdomen: soft, non-tender; bowel sounds normal; no masses,  no organomegaly Extremities: extremities normal, atraumatic, no cyanosis or edema Pulses: 2+ and symmetric Skin: Skin color, texture, turgor normal. No rashes or lesions Neurologic: Grossly normal Psych: Mood, affect normal  EKG:  Normal sinus rhythm at 76  ASSESSMENT: 1. Hypertension-controlled 2. Dyslipidemia-on simvastatin  PLAN: 1.   Mrs. Nowaczyk is doing well. Her shortness of breath is improved off of chemotherapy. I do think this is related to diastolic dysfunction. Her blood pressure is well controlled. She has been on simvastatin for her cholesterol I would like to recheck her cholesterol to see how well controlled it is. She is quite insistent that she goes on Vytorin, but this is more expensive and not sure of the addition of Zetia is necessary. Plan to see her back annually or sooner as necessary. I will contact her with the results of her cholesterol testing.   Chrystie Nose, MD, [facility] Attending Cardiologist [facility]  HILTY,Kenneth C 02/24/2013, 3:50 PM

## 2013-02-28 LAB — NMR LIPOPROFILE WITH LIPIDS
Cholesterol, Total: 218 mg/dL — ABNORMAL HIGH (ref <200)
HDL-C: 48 mg/dL (ref >=40)
LDL (calc): 134 mg/dL — ABNORMAL HIGH (ref <100)
LDL Particle Number: 2037 nmol/L — ABNORMAL HIGH (ref <1000)
LP-IR Score: 45 (ref <=45)
Triglycerides: 178 mg/dL — ABNORMAL HIGH (ref <150)
VLDL Size: 52.2 nm — ABNORMAL HIGH (ref <=46.6)

## 2013-03-02 ENCOUNTER — Ambulatory Visit: Payer: Self-pay | Admitting: Podiatry

## 2013-03-02 ENCOUNTER — Telehealth: Payer: Self-pay | Admitting: Internal Medicine

## 2013-03-02 DIAGNOSIS — E785 Hyperlipidemia, unspecified: Secondary | ICD-10-CM

## 2013-03-02 MED ORDER — EZETIMIBE-SIMVASTATIN 10-40 MG PO TABS: 1.0000 | ORAL_TABLET | Freq: Every day | ORAL | Status: DC

## 2013-03-02 NOTE — Telephone Encounter (Signed)
Wants lab result from Monday please.

## 2013-03-02 NOTE — Telephone Encounter (Signed)
Message forwarded to J. Elkins, RN.  

## 2013-03-02 NOTE — Telephone Encounter (Signed)
Called patient with lab results. instructed patient to stop simvastatin and start vytorin 10-40 once daily. Samples left at front desk for patient.

## 2013-03-03 ENCOUNTER — Encounter: Payer: Self-pay | Admitting: Oncology

## 2013-03-03 NOTE — Progress Notes (Signed)
Patient sent copy of denial for her insurance for [facility] lab. I sent back to her with ph# for Ronnald Ramp has to call them because we don't bill for them.

## 2013-03-06 ENCOUNTER — Telehealth: Payer: Self-pay | Admitting: *Deleted

## 2013-03-06 NOTE — Telephone Encounter (Signed)
Called patient to inquire about insurance information for PA for vytorin 10-40mg . Samples were provided when this medication was prescribed (a change from simvastatin on 03/02/13). Patient states that in Jan 2015 she will no longer have the same insurance company and that new company will cover this medication. Explained to patient the purpose of PA and she stated she would not need this.

## 2013-03-29 ENCOUNTER — Other Ambulatory Visit: Payer: Self-pay | Admitting: Oncology

## 2013-03-30 ENCOUNTER — Telehealth: Payer: Self-pay | Admitting: *Deleted

## 2013-03-30 NOTE — Telephone Encounter (Signed)
Lm informed the pt that on 04/05/13 her appts has been cancel. gv appt for 04/10/13 w/labs@ 2p, flush@ 2:30p and ov@ 3p...td

## 2013-04-05 ENCOUNTER — Ambulatory Visit: Payer: Medicare Other | Admitting: Oncology

## 2013-04-05 ENCOUNTER — Other Ambulatory Visit: Payer: Medicare Other | Admitting: Lab

## 2013-04-05 ENCOUNTER — Other Ambulatory Visit: Payer: Medicare Other

## 2013-04-09 ENCOUNTER — Other Ambulatory Visit: Payer: Self-pay | Admitting: Oncology

## 2013-04-10 ENCOUNTER — Ambulatory Visit (HOSPITAL_BASED_OUTPATIENT_CLINIC_OR_DEPARTMENT_OTHER): Payer: Medicare Other | Admitting: Oncology

## 2013-04-10 ENCOUNTER — Encounter: Payer: Self-pay | Admitting: Oncology

## 2013-04-10 ENCOUNTER — Ambulatory Visit (HOSPITAL_BASED_OUTPATIENT_CLINIC_OR_DEPARTMENT_OTHER): Payer: Medicare Other

## 2013-04-10 ENCOUNTER — Other Ambulatory Visit (HOSPITAL_BASED_OUTPATIENT_CLINIC_OR_DEPARTMENT_OTHER): Payer: Medicare Other

## 2013-04-10 ENCOUNTER — Telehealth: Payer: Self-pay | Admitting: Oncology

## 2013-04-10 VITALS — BP 142/82 | HR 87 | Temp 97.8°F | Resp 18 | Ht 64.0 in | Wt 182.7 lb

## 2013-04-10 VITALS — BP 135/62 | HR 87 | Temp 97.4°F | Resp 18

## 2013-04-10 DIAGNOSIS — C569 Malignant neoplasm of unspecified ovary: Secondary | ICD-10-CM

## 2013-04-10 DIAGNOSIS — D649 Anemia, unspecified: Secondary | ICD-10-CM

## 2013-04-10 DIAGNOSIS — C786 Secondary malignant neoplasm of retroperitoneum and peritoneum: Secondary | ICD-10-CM

## 2013-04-10 DIAGNOSIS — C801 Malignant (primary) neoplasm, unspecified: Secondary | ICD-10-CM

## 2013-04-10 DIAGNOSIS — F341 Dysthymic disorder: Secondary | ICD-10-CM

## 2013-04-10 DIAGNOSIS — I10 Essential (primary) hypertension: Secondary | ICD-10-CM

## 2013-04-10 LAB — CBC WITH DIFFERENTIAL/PLATELET
BASO%: 0.2 % (ref 0.0–2.0)
Basophils Absolute: 0 10*3/uL (ref 0.0–0.1)
EOS%: 4.5 % (ref 0.0–7.0)
Eosinophils Absolute: 0.4 10*3/uL (ref 0.0–0.5)
HEMATOCRIT: 33.4 % — AB (ref 34.8–46.6)
HGB: 10.8 g/dL — ABNORMAL LOW (ref 11.6–15.9)
LYMPH%: 34.3 % (ref 14.0–49.7)
MCH: 27.6 pg (ref 25.1–34.0)
MCHC: 32.3 g/dL (ref 31.5–36.0)
MCV: 85.2 fL (ref 79.5–101.0)
MONO#: 0.6 10*3/uL (ref 0.1–0.9)
MONO%: 6.6 % (ref 0.0–14.0)
NEUT#: 4.6 10*3/uL (ref 1.5–6.5)
NEUT%: 54.4 % (ref 38.4–76.8)
PLATELETS: 251 10*3/uL (ref 145–400)
RBC: 3.92 10*6/uL (ref 3.70–5.45)
RDW: 15 % — ABNORMAL HIGH (ref 11.2–14.5)
WBC: 8.5 10*3/uL (ref 3.9–10.3)
lymph#: 2.9 10*3/uL (ref 0.9–3.3)

## 2013-04-10 LAB — COMPREHENSIVE METABOLIC PANEL (CC13)
ALK PHOS: 70 U/L (ref 40–150)
ALT: 14 U/L (ref 0–55)
ANION GAP: 10 meq/L (ref 3–11)
AST: 16 U/L (ref 5–34)
Albumin: 3.6 g/dL (ref 3.5–5.0)
BILIRUBIN TOTAL: 0.26 mg/dL (ref 0.20–1.20)
BUN: 16 mg/dL (ref 7.0–26.0)
CO2: 26 meq/L (ref 22–29)
CREATININE: 1 mg/dL (ref 0.6–1.1)
Calcium: 9.8 mg/dL (ref 8.4–10.4)
Chloride: 104 mEq/L (ref 98–109)
GLUCOSE: 95 mg/dL (ref 70–140)
Potassium: 4.7 mEq/L (ref 3.5–5.1)
SODIUM: 139 meq/L (ref 136–145)
Total Protein: 7.8 g/dL (ref 6.4–8.3)

## 2013-04-10 MED ORDER — SODIUM CHLORIDE 0.9 % IJ SOLN
10.0000 mL | Freq: Once | INTRAMUSCULAR | Status: AC
  Administered 2013-04-10: 10 mL
  Filled 2013-04-10: qty 10

## 2013-04-10 MED ORDER — HEPARIN SOD (PORK) LOCK FLUSH 100 UNIT/ML IV SOLN
500.0000 [IU] | Freq: Once | INTRAVENOUS | Status: AC
  Administered 2013-04-10: 500 [IU]
  Filled 2013-04-10: qty 5

## 2013-04-10 NOTE — Progress Notes (Signed)
OFFICE PROGRESS NOTE   04/10/2013   Physicians:W.Brewster, V.Bland, J.Mann, K.Reddy   INTERVAL HISTORY:  Patient is seen, alone for visit, in scheduled follow up of history of recurrent ovarian cancer, on treatment with letrozole since late August 2014, which she continues to tolerate well. Last CT AP was 07-26-2012; she saw Dr Skeet Latch [phone] and will see her again in 06-2013. CA 125 has been useful marker, stable at 37 in 01-2013.  Patient tells me that she has felt the best for the past 6 months that she has in a very long time, "like a butterfly out of a cocoon". She has good appetite and energy, no abdominal or pelvic discomfort: she does notice increase in "arthritis" in knees and legs, and has tried 4x daily topical from Dr Criss Rosales with minimal improvement. She tells me that she had colonoscopy by Dr Collene Mares in 12-2012, with no polyps and no further routine colonoscopies needed.  She saw Dr [id number], noted SOB related to diastolic dysfunction.  She has PAC in, flushed today.  Patient tells me that she would be interested in clinical trials if any available when she might need change in treatment.  She brought copy of Living Will with her today, to be scanned into EMR.   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 She had optimal debulking by Dr.Brewster, 10 liters of ascites at that procedure. She was treated with 8 cycles of taxol/carboplatin thru Aug. 2011, with CA125 still ~ 40 at completion of the chemotherapy. The tumor was ER + at 100% and she was maintained on tamoxifen from Oct 2011 until Nov 2013. CA125 nadir was 20 in April 2012, with gradual rise subsequently, tho patient remained asymptomatic until SBO in 01-2012. Scans in 01-2012 showed moderate ascites and new peritoneal nodularity. She received single agent taxol from 03-21-13 , thru 11-08-12. CA 125 was 88 on 03-08-12, 53  in Feb, 50 in April, 32 in July. She started letrozole 11-15-12, tolerating this generally well.     Review of systems as above, also: No fever or symptoms of infection. No bleeding. No complaints of SOB now, or other respiratory symptoms. Bowels moving regularly without laxatives. No LE swelling. Remainder of 10 point Review of Systems negative.  Objective:  Vital signs in last 24 hours:  BP 142/82  Pulse 87  Temp(Src) 97.8 F (36.6 C) (Oral)  Resp 18  Ht 5\' 4"  (1.626 m)  Wt 182 lb 11.2 oz (82.872 kg)  BMI 31.34 kg/m2  Alert, oriented and appropriate. Ambulatory without difficulty.  Wearing wig.  HEENT:PERRL, sclerae not icteric. Oral mucosa moist without lesions, posterior pharynx clear.  Neck supple. No JVD.  Lymphatics:no cervical,suraclavicular, axillary or inguinal adenopathy Resp: clear to auscultation bilaterally and normal percussion bilaterally Cardio: regular rate and rhythm. No gallop. GI: obese, soft, nontender, not obviously distended, no appreciable mass or organomegaly. Normally active bowel sounds. Surgical incision not remarkable. Musculoskeletal/ Extremities: LE without pitting edema, cords, tenderness. Back not tender Neuro: no change peripheral neuropathy. Otherwise nonfocal Skin without rash, ecchymosis, petechiae Breasts: without dominant mass, skin or nipple findings. Axillae benign. Portacath-without erythema or tenderness  Lab Results:  Results for orders placed in visit on 04/10/13  CBC WITH DIFFERENTIAL      Result Value Range   WBC 8.5  3.9 - 10.3 10e3/uL   NEUT# 4.6  1.5 - 6.5 10e3/uL   HGB 10.8 (*) 11.6 - 15.9  g/dL   HCT 33.4 (*) 34.8 - 46.6 %   Platelets 251  145 - 400 10e3/uL   MCV 85.2  79.5 - 101.0 fL   MCH 27.6  25.1 - 34.0 pg   MCHC 32.3  31.5 - 36.0 g/dL   RBC 3.92  3.70 - 5.45 10e6/uL   RDW 15.0 (*) 11.2 - 14.5 %   lymph# 2.9  0.9 - 3.3 10e3/uL   MONO# 0.6  0.1 - 0.9 10e3/uL   Eosinophils Absolute 0.4  0.0 - 0.5 10e3/uL    Basophils Absolute 0.0  0.0 - 0.1 10e3/uL   NEUT% 54.4  38.4 - 76.8 %   LYMPH% 34.3  14.0 - 49.7 %   MONO% 6.6  0.0 - 14.0 %   EOS% 4.5  0.0 - 7.0 %   BASO% 0.2  0.0 - 2.0 %  COMPREHENSIVE METABOLIC PANEL (VQ25)      Result Value Range   Sodium 139  136 - 145 mEq/L   Potassium 4.7  3.5 - 5.1 mEq/L   Chloride 104  98 - 109 mEq/L   CO2 26  22 - 29 mEq/L   Glucose 95  70 - 140 mg/dl   BUN 16.0  7.0 - 26.0 mg/dL   Creatinine 1.0  0.6 - 1.1 mg/dL   Total Bilirubin 0.26  0.20 - 1.20 mg/dL   Alkaline Phosphatase 70  40 - 150 U/L   AST 16  5 - 34 U/L   ALT 14  0 - 55 U/L   Total Protein 7.8  6.4 - 8.3 g/dL   Albumin 3.6  3.5 - 5.0 g/dL   Calcium 9.8  8.4 - 10.4 mg/dL   Anion Gap 10  3 - 11 mEq/L    CA 125 available after visit 54.6, having been 37 in Nov, 39 in Oct and 32 in July. Studies/Results:  No results found. Last CT AP was 07-26-2012; last CXR 01-2012.  Medications: I have reviewed the patient's current medications.  DISCUSSION: at time of visit, patient and I were pleased that she continues to feel so well. We will be in touch with her to let her know that the marker was some higher. As it has been over 8 months since any scans, and with increase in marker, I will repeat CT AP with CXR and see her after those are done. She will continue letrozole at least for now.  Assessment/Plan: 1.recurrent ovarian carcinoma: history as above, still feeling well but increase in marker x1 now. Will get scans and follow up with me; note Dr Leone Brand next apt in April, and I will keep her updated. 2.PAC in 3.multifactorial anemia: hgb stable today and not symptomatic. Declined epogen. Has needed PRBCs with prior combination chemo; note family (not patient) are [employer]  4.Marland KitchenHTN : followed by Dr Debara Pickett  5.history of anxiety/ depression for which she is followed by psychiatry, doing well on present medication  6.Living Will will be scanned into this EMR. 7.Flu vaccine done elsewhere.  She is instructed to call for Tamiflu if symptoms of influenza. 8.colonoscopy done in October, reportedly fine and no further regular colonoscopies recommended.    Patient was comfortable with discussion at time of visit.   LIVESAY,LENNIS P, MD   04/10/2013, 3:06 PM

## 2013-04-11 LAB — CA 125: CA 125: 54.6 U/mL — AB (ref 0.0–30.2)

## 2013-04-12 ENCOUNTER — Other Ambulatory Visit: Payer: Self-pay | Admitting: Oncology

## 2013-04-12 ENCOUNTER — Telehealth: Payer: Self-pay

## 2013-04-12 DIAGNOSIS — C569 Malignant neoplasm of unspecified ovary: Secondary | ICD-10-CM

## 2013-04-12 NOTE — Telephone Encounter (Signed)
Message copied by Baruch Merl on Wed Apr 12, 2013  5:23 PM ------      Message from: [name] P      Created: Wed Apr 12, 2013  3:58 PM       Please let her know that the CA125 marker was some higher this week, at 56. I think we should get CT scans repeated now and I'll see her back after those. I know she has felt very well, and sometimes the marker is not correct, but to be careful I would like to recheck the CT.       Cc LA, TH ------

## 2013-04-12 NOTE — Telephone Encounter (Signed)
Reviewed information noted below by Dr. Marko Plume with patient.  She verbalized understanding. Scheduled her for 0845 visit on 04-24-13.  Ct scan is 04-20-13.

## 2013-04-13 ENCOUNTER — Ambulatory Visit (INDEPENDENT_AMBULATORY_CARE_PROVIDER_SITE_OTHER): Payer: Medicare Other | Admitting: Podiatry

## 2013-04-13 ENCOUNTER — Encounter: Payer: Self-pay | Admitting: Podiatry

## 2013-04-13 VITALS — BP 131/68 | HR 89 | Resp 12

## 2013-04-13 DIAGNOSIS — Q828 Other specified congenital malformations of skin: Secondary | ICD-10-CM

## 2013-04-13 DIAGNOSIS — E1159 Type 2 diabetes mellitus with other circulatory complications: Secondary | ICD-10-CM

## 2013-04-13 DIAGNOSIS — B351 Tinea unguium: Secondary | ICD-10-CM

## 2013-04-13 DIAGNOSIS — M79609 Pain in unspecified limb: Secondary | ICD-10-CM

## 2013-04-13 NOTE — Progress Notes (Signed)
Subjective:     Patient ID: Jaclyn [name], female   DOB: 1940-11-06, 73 y.o.   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

## 2013-04-18 ENCOUNTER — Telehealth: Payer: Self-pay

## 2013-04-18 NOTE — Telephone Encounter (Signed)
Told Jaclyn [name] that Dr. Marko Plume said that she needs the CT scan to evaluate the abdomen to see the status of her cancer as the marker is rising.   Jaclyn [name] stated that she feels the best she has ever had in 16 years.  She feels she is cancer free.  She read that the radiation exposure is excessive.  Told her that Dr. Marko Plume would not have her get  the Ct Scan if it would cause more harm to her.  This is the only way she can get a good picture of the abdomen. Jaclyn [name] stated that she has a cold and is going to the drug store to get something for it.  She does not want the scan at this point in her life. Told Jaclyn [name] that she needs to keep her appointment with Dr. Marko Plume on 04-24-13 to discuss plan of care.  Jaclyn [name] stated that she would be keeping appointment with Dr. Marko Plume.

## 2013-04-20 ENCOUNTER — Ambulatory Visit (HOSPITAL_COMMUNITY): Payer: Medicare Other

## 2013-04-20 ENCOUNTER — Telehealth: Payer: Self-pay | Admitting: *Deleted

## 2013-04-20 NOTE — Telephone Encounter (Signed)
Received call from patient that she wanted to cancel her appointment with Dr. Marko Plume on 04/24/13 because she couldn't make the copay. I spoke with Thermon Leyland, financial advocate, about the situation. Still have the patient come in for her appointment, and then we can establish a payment plan. I spoke with patient and she agreed to keep her appointment. All questions answered. Patient verbalized understanding.

## 2013-04-24 ENCOUNTER — Ambulatory Visit: Payer: Medicare Other | Admitting: Oncology

## 2013-04-27 ENCOUNTER — Telehealth: Payer: Self-pay | Admitting: *Deleted

## 2013-04-27 ENCOUNTER — Ambulatory Visit: Payer: Medicare Other

## 2013-04-27 ENCOUNTER — Ambulatory Visit (HOSPITAL_BASED_OUTPATIENT_CLINIC_OR_DEPARTMENT_OTHER): Payer: Medicare Other | Admitting: Oncology

## 2013-04-27 ENCOUNTER — Other Ambulatory Visit: Payer: Self-pay | Admitting: *Deleted

## 2013-04-27 ENCOUNTER — Encounter: Payer: Self-pay | Admitting: Oncology

## 2013-04-27 VITALS — BP 116/70 | HR 92 | Temp 98.0°F | Resp 18 | Ht 64.0 in | Wt 182.0 lb

## 2013-04-27 DIAGNOSIS — D638 Anemia in other chronic diseases classified elsewhere: Secondary | ICD-10-CM

## 2013-04-27 DIAGNOSIS — C569 Malignant neoplasm of unspecified ovary: Secondary | ICD-10-CM

## 2013-04-27 DIAGNOSIS — F341 Dysthymic disorder: Secondary | ICD-10-CM

## 2013-04-27 DIAGNOSIS — D649 Anemia, unspecified: Secondary | ICD-10-CM

## 2013-04-27 DIAGNOSIS — I10 Essential (primary) hypertension: Secondary | ICD-10-CM

## 2013-04-27 MED ORDER — FERROUS GLUCONATE 240 (27 FE) MG PO TABS: ORAL_TABLET | ORAL | Status: DC

## 2013-04-27 MED ORDER — LETROZOLE 2.5 MG PO TABS: 2.5000 mg | ORAL_TABLET | Freq: Every day | ORAL | Status: DC

## 2013-04-27 NOTE — Progress Notes (Signed)
Patient inquired about her copays and how they went up. I advised her on dr visits there is a copay but she can be billed and then set up pmts with billing. She said she his on fixed income and hard to pay each time.

## 2013-04-27 NOTE — Telephone Encounter (Signed)
appts made and printed...td 

## 2013-04-27 NOTE — Progress Notes (Signed)
OFFICE PROGRESS NOTE   04/27/2013   Physicians:W.Brewster, V.Bland, J.Mann, K.Reddy   INTERVAL HISTORY:  Patient is seen in follow up of recent concerns about status of her ovarian cancer, for which she continues letrozole since Aug 2014. CA 125 was higher at recent visit; last scans were 07-26-2012.  Patient was seen initially alone at her request for full discussion, then all of our discussion was repeated with patient and daughter together, also at patient's request.  Ms. Jobin continues to feel well with exception of arthritis/ possibly arthralgias in LE bilaterally. She is using topical cream from Dr Criss Rosales 4x daily to joints (not presently listed in EMR), which was still Dr Fransico Setters preference due to other meds/ comorbidities at recent visit per patient now. Patient understands that letrozole can cause arthralgias, which should improve after that medicine is stopped if so.   When I saw patient last on 04-10-13, CA 125 had increased to 54.6, from 37 in Nov and 39 as baseline for letrozole. This marker has been a useful monitor for her disease previously. Patient was not comfortable with recommendation to repeat CT.  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 She had optimal debulking by Dr.Brewster, 10 liters of ascites at that procedure. She was treated with 8 cycles of taxol/carboplatin thru Aug. 2011, with CA125 still ~ 40 at completion of the chemotherapy. The tumor was ER + at 100% and she was maintained on tamoxifen from Oct 2011 until Nov 2013. CA125 nadir was 20 in April 2012, with gradual rise subsequently, tho patient remained asymptomatic until SBO in 01-2012. Scans in 01-2012 showed moderate ascites and new peritoneal nodularity. She received single agent taxol from 03-21-12  thru 11-08-12. CA 125 was 88 on 03-08-12, 53 in Feb, 50 in April, 32 in July.  She started letrozole 11-15-12, tolerating this generally well tho arthralgias/ "arthritis" may be related to this medication. Ca125 was 37 in Nov and was 54.6 on 04-10-2013.  Review of systems as above, also: Arthritis symptoms primarily in hips/ knees/ ankles bilaterally. No fever, no SOB, good appetite, bowels ok, no N/V. Remainder of 10 point Review of Systems negative.  Objective:  Vital signs in last 24 hours:  BP 116/70  Pulse 92  Temp(Src) 98 F (36.7 C) (Oral)  Resp 18  Ht 5\' 4"  (1.626 m)  Wt 182 lb (82.555 kg)  BMI 31.22 kg/m2  Alert, oriented and appropriate. Ambulatory without assistance. Wears wig.  HEENT:PERRL, sclerae not icteric. Oral mucosa moist without lesions, posterior pharynx clear. Dentures Neck supple. No JVD.  Lymphatics:no cervical,suraclavicular or inguinal adenopathy Resp: clear to auscultation bilaterally and normal percussion bilaterally Cardio: regular rate and rhythm. No gallop. GI: obese, soft, nontender, not distended, no mass or organomegaly. Normally active bowel sounds. Surgical incision not remarkable. Musculoskeletal/ Extremities: without pitting edema, cords, tenderness. Joints without heat, obvious effusion, erythema Neuro: no peripheral neuropathy. Otherwise nonfocal Skin without rash, ecchymosis, petechiae Portacath-without erythema or tenderness  Lab Results:  Results for orders placed in visit on 04/10/13  CBC WITH DIFFERENTIAL      Result Value Range   WBC 8.5  3.9 - 10.3 10e3/uL   NEUT# 4.6  1.5 - 6.5 10e3/uL   HGB 10.8 (*) 11.6 - 15.9 g/dL   HCT 33.4 (*) 34.8 - 46.6 %   Platelets 251  145 - 400 10e3/uL   MCV 85.2  79.5 -  101.0 fL   MCH 27.6  25.1 - 34.0 pg   MCHC 32.3  31.5 - 36.0 g/dL   RBC 3.92  3.70 - 5.45 10e6/uL   RDW 15.0 (*) 11.2 - 14.5 %   lymph# 2.9  0.9 - 3.3 10e3/uL   MONO# 0.6  0.1 - 0.9 10e3/uL   Eosinophils Absolute 0.4  0.0 - 0.5 10e3/uL   Basophils Absolute 0.0  0.0 - 0.1 10e3/uL   NEUT% 54.4  38.4 - 76.8  %   LYMPH% 34.3  14.0 - 49.7 %   MONO% 6.6  0.0 - 14.0 %   EOS% 4.5  0.0 - 7.0 %   BASO% 0.2  0.0 - 2.0 %  COMPREHENSIVE METABOLIC PANEL (0000000)      Result Value Range   Sodium 139  136 - 145 mEq/L   Potassium 4.7  3.5 - 5.1 mEq/L   Chloride 104  98 - 109 mEq/L   CO2 26  22 - 29 mEq/L   Glucose 95  70 - 140 mg/dl   BUN 16.0  7.0 - 26.0 mg/dL   Creatinine 1.0  0.6 - 1.1 mg/dL   Total Bilirubin 0.26  0.20 - 1.20 mg/dL   Alkaline Phosphatase 70  40 - 150 U/L   AST 16  5 - 34 U/L   ALT 14  0 - 55 U/L   Total Protein 7.8  6.4 - 8.3 g/dL   Albumin 3.6  3.5 - 5.0 g/dL   Calcium 9.8  8.4 - 10.4 mg/dL   Anion Gap 10  3 - 11 mEq/L  CA 125      Result Value Range   CA 125 54.6 (*) 0.0 - 30.2 U/mL     Studies/Results:  No results found.  Medications: I have reviewed the patient's current medications.  DISCUSSION: we have discussed course of the ovarian cancer as above, including correlation of ca125 with disease activity; I have given her written summary her treatment and the marker levels thru her course. We have discussed usefulness of marker giving advance notice of when treatment may need to change, in order to prevent symptoms from progressive disease. We have discussed fact that she really is not having frequent scans, and that benefit of scans with known persistent malignancy under treatment seems to me worth some additional radiation exposure; I have contrasted this use of scans with other uses of scans where radiation risk may indeed outweigh benefit. She denies any concerns other than the radiation exposure, and the fact that she has felt so well recently; she did agree to talk with financial counselor, as previous phone conversation mentioned copay concerns.  At conclusion of the second full discussion of the visit, patient declined CT again. We have agreed to follow marker over next couple of months and discuss again if that continues to be of concern.  Prior to daughter back in  room, patient has asked questions about her Living Will and HCPOA. She does not want extraordinary measures including resuscitation or life support in situation where she is not expected to recover. HCPOA is her pastor, and she understands that this person will make health care decisions for her if she is unable to do that. We have reviewed the copies of her Living Will that she has with her, and she is comfortable with explanations and her choices.  Assessment/Plan:  1.recurrent ovarian carcinoma: history as above, still feeling well but increase in marker x1 now. Patient prefers to follow without repeat  scans for now. She will call prior to next visit if needed. Will recheck labs with PAC flush ~ 06-05-13. She is to see Dr Skeet Latch in April. 2.PAC in, flushed 04-10-13 3.multifactorial anemia: hgb stable today and not symptomatic. Declined epogen. Has needed PRBCs with prior combination chemo; note family (not patient) are [employer]  4.Marland KitchenHTN : followed by Dr Debara Pickett  5.history of anxiety/ depression for which she is followed by psychiatry, doing well on present medication  6.Living Will and HCPOA in place 7.Flu vaccine done elsewhere. She is instructed to call for Tamiflu if symptoms of influenza.  8.colonoscopy done in October 2014, reportedly fine and no further regular colonoscopies recommended.   Face to face time 40 min, >50% counseling and coordination of care.    Charlcie Prisco P, MD   04/27/2013, 11:12 AM

## 2013-04-27 NOTE — Telephone Encounter (Signed)
[facility] on [address] faxed Ferrous Gluconate 240 mg refill request.  Request to provider's in basket for review.

## 2013-04-27 NOTE — Telephone Encounter (Signed)
Verbal order received and read back from Dr. Marko Plume to fill this medicine.

## 2013-04-27 NOTE — Telephone Encounter (Signed)
Called Pharmacy and left this refill on voicemail.

## 2013-04-28 ENCOUNTER — Encounter: Payer: Self-pay | Admitting: *Deleted

## 2013-05-22 ENCOUNTER — Other Ambulatory Visit: Payer: Self-pay

## 2013-05-25 ENCOUNTER — Ambulatory Visit: Payer: Medicare Other | Admitting: Internal Medicine

## 2013-05-25 ENCOUNTER — Inpatient Hospital Stay (HOSPITAL_COMMUNITY)
Admission: EM | Admit: 2013-05-25 | Discharge: 2013-05-29 | DRG: 392 | Disposition: A | Discharge status: Home / Self Care | Payer: Medicare Other | Attending: Internal Medicine | Admitting: Internal Medicine

## 2013-05-25 ENCOUNTER — Emergency Department (HOSPITAL_COMMUNITY): Payer: Medicare Other

## 2013-05-25 ENCOUNTER — Encounter (HOSPITAL_COMMUNITY): Payer: Self-pay | Admitting: Emergency Medicine

## 2013-05-25 DIAGNOSIS — A088 Other specified intestinal infections: Principal | ICD-10-CM | POA: Diagnosis present

## 2013-05-25 DIAGNOSIS — N179 Acute kidney failure, unspecified: Secondary | ICD-10-CM

## 2013-05-25 DIAGNOSIS — Z9089 Acquired absence of other organs: Secondary | ICD-10-CM

## 2013-05-25 DIAGNOSIS — N390 Urinary tract infection, site not specified: Secondary | ICD-10-CM | POA: Diagnosis present

## 2013-05-25 DIAGNOSIS — Z8543 Personal history of malignant neoplasm of ovary: Secondary | ICD-10-CM

## 2013-05-25 DIAGNOSIS — F32A Depression, unspecified: Secondary | ICD-10-CM

## 2013-05-25 DIAGNOSIS — B961 Klebsiella pneumoniae [K. pneumoniae] as the cause of diseases classified elsewhere: Secondary | ICD-10-CM | POA: Diagnosis present

## 2013-05-25 DIAGNOSIS — F329 Major depressive disorder, single episode, unspecified: Secondary | ICD-10-CM

## 2013-05-25 DIAGNOSIS — R509 Fever, unspecified: Secondary | ICD-10-CM

## 2013-05-25 DIAGNOSIS — Z7982 Long term (current) use of aspirin: Secondary | ICD-10-CM

## 2013-05-25 DIAGNOSIS — I10 Essential (primary) hypertension: Secondary | ICD-10-CM

## 2013-05-25 DIAGNOSIS — Z8249 Family history of ischemic heart disease and other diseases of the circulatory system: Secondary | ICD-10-CM

## 2013-05-25 DIAGNOSIS — C569 Malignant neoplasm of unspecified ovary: Secondary | ICD-10-CM

## 2013-05-25 DIAGNOSIS — E872 Acidosis, unspecified: Secondary | ICD-10-CM

## 2013-05-25 DIAGNOSIS — B9689 Other specified bacterial agents as the cause of diseases classified elsewhere: Secondary | ICD-10-CM | POA: Diagnosis present

## 2013-05-25 DIAGNOSIS — F3289 Other specified depressive episodes: Secondary | ICD-10-CM | POA: Diagnosis present

## 2013-05-25 DIAGNOSIS — E785 Hyperlipidemia, unspecified: Secondary | ICD-10-CM | POA: Diagnosis present

## 2013-05-25 DIAGNOSIS — R197 Diarrhea, unspecified: Secondary | ICD-10-CM | POA: Diagnosis present

## 2013-05-25 DIAGNOSIS — D638 Anemia in other chronic diseases classified elsewhere: Secondary | ICD-10-CM | POA: Diagnosis present

## 2013-05-25 DIAGNOSIS — R112 Nausea with vomiting, unspecified: Secondary | ICD-10-CM | POA: Diagnosis present

## 2013-05-25 DIAGNOSIS — Z833 Family history of diabetes mellitus: Secondary | ICD-10-CM

## 2013-05-25 DIAGNOSIS — E860 Dehydration: Secondary | ICD-10-CM | POA: Diagnosis present

## 2013-05-25 DIAGNOSIS — D6481 Anemia due to antineoplastic chemotherapy: Secondary | ICD-10-CM

## 2013-05-25 DIAGNOSIS — F411 Generalized anxiety disorder: Secondary | ICD-10-CM | POA: Diagnosis present

## 2013-05-25 DIAGNOSIS — E876 Hypokalemia: Secondary | ICD-10-CM

## 2013-05-25 DIAGNOSIS — T451X5A Adverse effect of antineoplastic and immunosuppressive drugs, initial encounter: Secondary | ICD-10-CM

## 2013-05-25 DIAGNOSIS — Z823 Family history of stroke: Secondary | ICD-10-CM

## 2013-05-25 DIAGNOSIS — K56600 Partial intestinal obstruction, unspecified as to cause: Secondary | ICD-10-CM

## 2013-05-25 DIAGNOSIS — C801 Malignant (primary) neoplasm, unspecified: Secondary | ICD-10-CM

## 2013-05-25 DIAGNOSIS — C786 Secondary malignant neoplasm of retroperitoneum and peritoneum: Secondary | ICD-10-CM

## 2013-05-25 LAB — URINALYSIS, ROUTINE W REFLEX MICROSCOPIC
BILIRUBIN URINE: NEGATIVE
GLUCOSE, UA: NEGATIVE mg/dL
Hgb urine dipstick: NEGATIVE
KETONES UR: NEGATIVE mg/dL
Nitrite: NEGATIVE
PH: 5 (ref 5.0–8.0)
PROTEIN: NEGATIVE mg/dL
Specific Gravity, Urine: 1.006 (ref 1.005–1.030)
Urobilinogen, UA: 0.2 mg/dL (ref 0.0–1.0)

## 2013-05-25 LAB — CBC WITH DIFFERENTIAL/PLATELET
BASOS PCT: 0 % (ref 0–1)
Basophils Absolute: 0 10*3/uL (ref 0.0–0.1)
Eosinophils Absolute: 0.2 10*3/uL (ref 0.0–0.7)
Eosinophils Relative: 2 % (ref 0–5)
HEMATOCRIT: 32.8 % — AB (ref 36.0–46.0)
Hemoglobin: 10.8 g/dL — ABNORMAL LOW (ref 12.0–15.0)
LYMPHS PCT: 22 % (ref 12–46)
Lymphs Abs: 2.3 10*3/uL (ref 0.7–4.0)
MCH: 28 pg (ref 26.0–34.0)
MCHC: 32.9 g/dL (ref 30.0–36.0)
MCV: 85 fL (ref 78.0–100.0)
MONOS PCT: 8 % (ref 3–12)
Monocytes Absolute: 0.8 10*3/uL (ref 0.1–1.0)
NEUTROS PCT: 68 % (ref 43–77)
Neutro Abs: 7 10*3/uL (ref 1.7–7.7)
Platelets: 310 10*3/uL (ref 150–400)
RBC: 3.86 MIL/uL — AB (ref 3.87–5.11)
RDW: 15 % (ref 11.5–15.5)
WBC: 10.3 10*3/uL (ref 4.0–10.5)

## 2013-05-25 LAB — COMPREHENSIVE METABOLIC PANEL
ALK PHOS: 83 U/L (ref 39–117)
ALT: 6 U/L (ref 0–35)
AST: 13 U/L (ref 0–37)
Albumin: 4 g/dL (ref 3.5–5.2)
BILIRUBIN TOTAL: 0.3 mg/dL (ref 0.3–1.2)
BUN: 33 mg/dL — AB (ref 6–23)
CHLORIDE: 95 meq/L — AB (ref 96–112)
CO2: 17 mEq/L — ABNORMAL LOW (ref 19–32)
Calcium: 10 mg/dL (ref 8.4–10.5)
Creatinine, Ser: 2.56 mg/dL — ABNORMAL HIGH (ref 0.50–1.10)
GFR calc Af Amer: 20 mL/min — ABNORMAL LOW (ref >90)
GFR calc non Af Amer: 17 mL/min — ABNORMAL LOW (ref >90)
Glucose, Bld: 93 mg/dL (ref 70–99)
POTASSIUM: 4.2 meq/L (ref 3.7–5.3)
SODIUM: 132 meq/L — AB (ref 137–147)
TOTAL PROTEIN: 8.7 g/dL — AB (ref 6.0–8.3)

## 2013-05-25 LAB — URINE MICROSCOPIC-ADD ON

## 2013-05-25 LAB — POC OCCULT BLOOD, ED: Fecal Occult Bld: NEGATIVE

## 2013-05-25 MED ORDER — FERROUS GLUCONATE 240 (27 FE) MG PO TABS
240.0000 mg | ORAL_TABLET | Freq: Two times a day (BID) | ORAL | Status: DC
Start: 2013-05-25 — End: 2013-05-26
  Administered 2013-05-25 – 2013-05-26 (×2): 240 mg via ORAL
  Filled 2013-05-25 (×4): qty 1

## 2013-05-25 MED ORDER — POTASSIUM CHLORIDE CRYS ER 20 MEQ PO TBCR
20.0000 meq | EXTENDED_RELEASE_TABLET | Freq: Two times a day (BID) | ORAL | Status: DC
  Administered 2013-05-25 – 2013-05-28 (×7): 20 meq via ORAL
  Filled 2013-05-25 (×10): qty 1

## 2013-05-25 MED ORDER — HYDROCHLOROTHIAZIDE 10 MG/ML ORAL SUSPENSION
6.2500 mg | Freq: Every day | ORAL | Status: DC
  Administered 2013-05-26 – 2013-05-29 (×4): 6.25 mg via ORAL
  Filled 2013-05-25 (×7): qty 1.25

## 2013-05-25 MED ORDER — HYDROCODONE-ACETAMINOPHEN 5-325 MG PO TABS
1.0000 | ORAL_TABLET | ORAL | Status: DC | PRN
  Administered 2013-05-26: 2 via ORAL
  Administered 2013-05-28: 1 via ORAL
  Filled 2013-05-25: qty 1
  Filled 2013-05-25: qty 2

## 2013-05-25 MED ORDER — SODIUM CHLORIDE 0.9 % IV SOLN
INTRAVENOUS | Status: DC
  Administered 2013-05-25: 12:00:00 via INTRAVENOUS

## 2013-05-25 MED ORDER — SODIUM CHLORIDE 0.9 % IV BOLUS (SEPSIS)
1000.0000 mL | Freq: Once | INTRAVENOUS | Status: AC
  Administered 2013-05-25: 1000 mL via INTRAVENOUS

## 2013-05-25 MED ORDER — ONDANSETRON HCL 4 MG PO TABS: 4.0000 mg | ORAL_TABLET | Freq: Four times a day (QID) | ORAL | Status: DC | PRN

## 2013-05-25 MED ORDER — SODIUM CHLORIDE 0.9 % IV SOLN
INTRAVENOUS | Status: DC
  Administered 2013-05-26 – 2013-05-27 (×2): 75 mL/h via INTRAVENOUS
  Administered 2013-05-27 – 2013-05-28 (×2): via INTRAVENOUS

## 2013-05-25 MED ORDER — LURASIDONE HCL 20 MG PO TABS
30.0000 mg | ORAL_TABLET | Freq: Every day | ORAL | Status: DC
  Administered 2013-05-25: 30 mg via ORAL
  Filled 2013-05-25 (×2): qty 2

## 2013-05-25 MED ORDER — ASPIRIN 81 MG PO CHEW
81.0000 mg | CHEWABLE_TABLET | Freq: Every day | ORAL | Status: DC
  Administered 2013-05-26 – 2013-05-29 (×4): 81 mg via ORAL
  Filled 2013-05-25 (×4): qty 1

## 2013-05-25 MED ORDER — SERTRALINE HCL 25 MG PO TABS
25.0000 mg | ORAL_TABLET | Freq: Every day | ORAL | Status: DC
  Administered 2013-05-25 – 2013-05-28 (×4): 25 mg via ORAL
  Filled 2013-05-25 (×5): qty 1

## 2013-05-25 MED ORDER — ENOXAPARIN SODIUM 30 MG/0.3ML ~~LOC~~ SOLN
30.0000 mg | SUBCUTANEOUS | Status: DC
  Administered 2013-05-25 – 2013-05-27 (×3): 30 mg via SUBCUTANEOUS
  Filled 2013-05-25 (×4): qty 0.3

## 2013-05-25 MED ORDER — EZETIMIBE-SIMVASTATIN 10-40 MG PO TABS
1.0000 | ORAL_TABLET | Freq: Every day | ORAL | Status: DC
  Administered 2013-05-26 – 2013-05-29 (×4): 1 via ORAL
  Filled 2013-05-25 (×4): qty 1

## 2013-05-25 MED ORDER — MEMANTINE HCL ER 28 MG PO CP24
28.0000 mg | ORAL_CAPSULE | Freq: Every day | ORAL | Status: DC
  Administered 2013-05-26 – 2013-05-29 (×4): 28 mg via ORAL
  Filled 2013-05-25 (×4): qty 28

## 2013-05-25 MED ORDER — LOSARTAN POTASSIUM 50 MG PO TABS
50.0000 mg | ORAL_TABLET | Freq: Every day | ORAL | Status: DC
  Administered 2013-05-25 – 2013-05-29 (×5): 50 mg via ORAL
  Filled 2013-05-25 (×5): qty 1

## 2013-05-25 MED ORDER — DIAZEPAM 5 MG/ML IJ SOLN
2.5000 mg | Freq: Once | INTRAMUSCULAR | Status: AC
  Administered 2013-05-25: 2.5 mg via INTRAVENOUS
  Filled 2013-05-25: qty 2

## 2013-05-25 MED ORDER — HYDROMORPHONE HCL PF 1 MG/ML IJ SOLN
1.0000 mg | INTRAMUSCULAR | Status: DC | PRN
  Administered 2013-05-26 – 2013-05-27 (×7): 1 mg via INTRAVENOUS
  Filled 2013-05-25 (×7): qty 1

## 2013-05-25 MED ORDER — LURASIDONE HCL 40 MG PO TABS: 30.0000 mg | ORAL_TABLET | Freq: Every day | ORAL | Status: DC

## 2013-05-25 MED ORDER — IOHEXOL 300 MG/ML  SOLN
50.0000 mL | Freq: Once | INTRAMUSCULAR | Status: AC | PRN
  Administered 2013-05-25: 50 mL via ORAL

## 2013-05-25 MED ORDER — LOSARTAN POTASSIUM-HCTZ 100-12.5 MG PO TABS: 0.5000 | ORAL_TABLET | Freq: Every day | ORAL | Status: DC

## 2013-05-25 MED ORDER — ONDANSETRON HCL 4 MG/2ML IJ SOLN
4.0000 mg | Freq: Four times a day (QID) | INTRAMUSCULAR | Status: DC | PRN
  Administered 2013-05-25 – 2013-05-26 (×2): 4 mg via INTRAVENOUS
  Filled 2013-05-25 (×2): qty 2

## 2013-05-25 MED ORDER — PANTOPRAZOLE SODIUM 40 MG PO TBEC
40.0000 mg | DELAYED_RELEASE_TABLET | Freq: Every day | ORAL | Status: DC
  Administered 2013-05-26 – 2013-05-29 (×4): 40 mg via ORAL
  Filled 2013-05-25 (×4): qty 1

## 2013-05-25 MED ORDER — LETROZOLE 2.5 MG PO TABS
2.5000 mg | ORAL_TABLET | Freq: Every day | ORAL | Status: DC
  Administered 2013-05-25 – 2013-05-27 (×3): 2.5 mg via ORAL
  Filled 2013-05-25 (×7): qty 1

## 2013-05-25 NOTE — H&P (Signed)
[facility] History and Physical  Jaclyn [name] EUM:[id number] DOB: [birth year] DOA: 05/25/2013  Referring physician: ED physician PCP: [name], MD   Chief Complaint: Diarrhea   HPI:  Patient is 73 yo female with history of ovarian cancer but now in remission, who presented to [facility] ED with main concern of several days duration of progressively worsening watery and non bloody diarrhea, 10 - 15 episodes per day, associated with generalized epigastric discomfort, nausea, on episode of non bloody vomiting earlier today and several days duration of poor oral intake. Pt denies any fevers but reports chills, no known sick contacts or exposures, no recent use of ABX. Her symptoms have been also associated with lightheadedness and dizziness and as a result she has been mostly in bed. She denies any specific urinary concerns, no specific focal neurological symptoms.   In ED, pt is hemodynamically stable, noted to have diarrhea. BMP reveled new onset acute renal failure and bump in Cr from baseline of ~1 to now > 2. TR[facility] asked to admit for further evaluation.   Assessment and Plan: Active Problems:   Diarrhea - unclear etiology at this time, possibly viral - will admit to medical floor - obtain stool analysis, culture, O&P, stool GI panel  - provide supportive care with IVF, analgesia and antiemetics as needed    Acute renal failure - likely pre renal in etiology secondary to diarrhea - will provide IVF and repeat BMP in AM   Metabolic acidosis - most likely from poor oral intake and dehydration  - IVF as noted above and repeat BMP In AM   Anemia of chronic disease  - no signs of active bleeding - Hg and Hct at pt's baseline - repeat CBC in AM  Radiological Exams on Admission: Ct Abdomen Pelvis Wo Contrast   05/25/2013   1. Mild dilation of the distal small bowel consistent with ileus or early partial obstruction.  2. Fluid stool throughout the colon suggests diarrhea.  3. No  evidence of peritoneal metastatic recurrence.  4. Small amount free fluid in the pelvis.     Code Status: Full Family Communication: Pt at bedside Disposition Plan: Admit for further evaluation     Review of Systems:  Constitutional: Negative for diaphoresis.  HENT: Negative for hearing loss, ear pain, nosebleeds, congestion, sore throat, neck pain, tinnitus and ear discharge.   Eyes: Negative for blurred vision, double vision, photophobia, pain, discharge and redness.  Respiratory: Negative for cough, hemoptysis, sputum production, shortness of breath, wheezing and stridor.   Cardiovascular: Negative for chest pain, palpitations, orthopnea, claudication and leg swelling.  Gastrointestinal: Negative for heartburn, constipation, blood in stool and melena.  Genitourinary: Negative for dysuria, urgency, frequency, hematuria and flank pain.  Musculoskeletal: Negative for myalgias, back pain, joint pain and falls.  Skin: Negative for itching and rash.  Neurological: Negative for tingling, tremors, sensory change, speech change, focal weakness, loss of consciousness and headaches.  Endo/Heme/Allergies: Negative for environmental allergies and polydipsia. Does not bruise/bleed easily.  Psychiatric/Behavioral: Negative for suicidal ideas. The patient is not nervous/anxious.      Past Medical History  Diagnosis Date  . Anxiety   . Depression   . Diabetes mellitus     BORDERLINE  . Hypertension   . History of irritable bowel syndrome   . Hx of colonoscopy 0/2010    BY DR. MANN  . Ovarian cancer 04/02/2009    IIIC - tumor is small intestine  . Gastric reflux   . Dyslipidemia  Past Surgical History  Procedure Laterality Date  . Vaginal hysterectomy  1980  . Tubal ligation Bilateral 1963  . Cholecystectomy  ~ 2007  . Transthoracic echocardiogram  05/2012    EF 60-65%, mod conc hypertrophy, grade 1 diastolic dysfunction; mild MR    Social History:  reports that she has never  smoked. She has never used smokeless tobacco. She reports that she does not drink alcohol or use illicit drugs.  Allergies  Allergen Reactions  . Fish Allergy     With mercury   . Lorazepam     " INTOLERANT-2 DAUGHTERS"  . Sulfa Antibiotics     Pt unable to remember reaction    Family History  Problem Relation Age of Onset  . Breast cancer Cousin   . Heart disease Mother   . Stroke Father   . Diabetes Sister   . Lupus Sister   . Heart disease Child     Prior to Admission medications   Medication Sig Start Date End Date Taking? Authorizing Provider  aspirin 81 MG tablet Take 81 mg by mouth daily.     Yes Historical Provider, MD  ezetimibe-simvastatin (VYTORIN) 10-40 MG per tablet Take 1 tablet by mouth daily. 03/02/13  Yes Pixie Casino, MD  ferrous gluconate (FERGON) 240 (27 FE) MG tablet Take 2 tablets by mouth every day on an empty stomach with orange juice 04/27/13  Yes Lennis Marion Downer, MD  letrozole (FEMARA) 2.5 MG tablet Take 1 tablet (2.5 mg total) by mouth daily. 04/27/13  Yes Lennis Marion Downer, MD  lidocaine-prilocaine (EMLA) cream Apply 1 application topically as needed. PRIOR TO PORTA-CATH ACCESS 06/21/12  Yes Lennis Marion Downer, MD  losartan-hydrochlorothiazide (HYZAAR) 100-12.5 MG per tablet Take 0.5 tablets by mouth. Mon, Wed, Fri only 04/05/12  Yes Lucianne Lei, MD  lurasidone (LATUDA) 40 MG TABS tablet Take 30 mg by mouth daily with breakfast.   Yes Historical Provider, MD  NAMENDA XR 28 MG CP24 Take 1 tablet by mouth daily. 05/04/13  Yes Historical Provider, MD  omeprazole (PRILOSEC) 20 MG capsule Take 20 mg by mouth 2 (two) times daily before a meal.   Yes Historical Provider, MD  ondansetron (ZOFRAN) 8 MG tablet 8 mg tablets, take 1-2 tablets every 12 hours as needed for nausea 02/26/12  Yes Lennis P Livesay, MD  potassium chloride SA (K-DUR,KLOR-CON) 20 MEQ tablet Take 20 mEq by mouth 2 (two) times daily.  10/23/10  Yes Historical Provider, MD  sertraline (ZOLOFT) 25 MG  tablet Take 25 mg by mouth at bedtime.  12/07/08  Yes Ricard Dillon, MD    Physical Exam: Filed Vitals:   05/25/13 1019 05/25/13 1156 05/25/13 1157 05/25/13 1157  BP: 140/79 135/70 135/77 148/70  Pulse: 97 84 91 94  Temp: 97.9 F (36.6 C)     TempSrc: Oral     Resp: 18     SpO2: 99%       Physical Exam  Constitutional: Appears well-developed and well-nourished. No distress.  HENT: Normocephalic. External right and left ear normal. Dry MM Eyes: Conjunctivae and EOM are normal. PERRLA, no scleral icterus.  Neck: Normal ROM. Neck supple. No JVD. No tracheal deviation. No thyromegaly.  CVS: RRR, S1/S2 +, no murmurs, no gallops, no carotid bruit.  Pulmonary: Effort and breath sounds normal, no stridor, rhonchi, wheezes, rales.  Abdominal: Soft. BS +,  no distension, tenderness in epigastric area, no rebound or guarding.  Musculoskeletal: Normal range of motion. No edema and no tenderness.  Lymphadenopathy: No lymphadenopathy noted, cervical, inguinal. Neuro: Alert. Normal reflexes, muscle tone coordination. No cranial nerve deficit. Skin: Skin is warm and dry. No rash noted. Not diaphoretic. No erythema. No pallor.  Psychiatric: Normal mood and affect. Behavior, judgment, thought content normal.   Labs on Admission:  Basic Metabolic Panel:  Recent Labs Lab 05/25/13 1127  NA 132*  K 4.2  CL 95*  CO2 17*  GLUCOSE 93  BUN 33*  CREATININE 2.56*  CALCIUM 10.0   Liver Function Tests:  Recent Labs Lab 05/25/13 1127  AST 13  ALT 6  ALKPHOS 83  BILITOT 0.3  PROT 8.7*  ALBUMIN 4.0   CBC:  Recent Labs Lab 05/25/13 1127  WBC 10.3  NEUTROABS 7.0  HGB 10.8*  HCT 32.8*  MCV 85.0  PLT 310   EKG: Normal sinus rhythm, no ST/T wave changes  Faye Ramsay, MD  [facility] Pager [phone]  If 7PM-7AM, please contact night-coverage [email or web address] Password [facility] 05/25/2013, 2:39 PM

## 2013-05-25 NOTE — ED Notes (Signed)
Bed: QS12 Expected date:  Expected time:  Means of arrival:  Comments: EMS- CA Pt, diarrhea x several days

## 2013-05-25 NOTE — Progress Notes (Signed)
Daughter took patients medications home with her.

## 2013-05-25 NOTE — ED Provider Notes (Signed)
CSN: [id number]     Arrival date & time 05/25/13  1010 History   First MD Initiated Contact with Patient 05/25/13 1018     Chief Complaint  Patient presents with  . Diarrhea     (Consider location/radiation/quality/duration/timing/severity/associated sxs/prior Treatment) HPI Patient reports for the past couple of days she has been having diarrhea that she describes as watery and loose. She states she's having over 15 episodes a day. She denies any abdominal pain. She reports at 3 AM this morning she had an episode of vomiting. She states she took 2 nausea pills and her nausea is now gone. She denies fever but states she feels weak. She denies feeling dizzy or lightheaded. She denies being on any antibiotics recently she denies any change in her food or being around anybody else is ill. She states she had problems with diarrhea before in November 2013 when she had recurrence of her ovarian cancer. Patient presents to the emergency department via EMS with her suitcase.  PCP Dr Clayburn [facility] Dr Marko Plume  Past Medical History  Diagnosis Date  . Anxiety   . Depression   . Diabetes mellitus     BORDERLINE  . Hypertension   . History of irritable bowel syndrome   . Hx of colonoscopy 0/2010    BY DR. MANN  . Ovarian cancer 04/02/2009    [facility] - tumor is small intestine  . Gastric reflux   . Dyslipidemia    Past Surgical History  Procedure Laterality Date  . Vaginal hysterectomy  1980  . Tubal ligation Bilateral 1963  . Cholecystectomy  ~ 2007  . Transthoracic echocardiogram  05/2012    EF 60-65%, mod conc hypertrophy, grade 1 diastolic dysfunction; mild MR   Family History  Problem Relation Age of Onset  . Breast cancer Cousin   . Heart disease Mother   . Stroke Father   . Diabetes Sister   . Lupus Sister   . Heart disease Child    History  Substance Use Topics  . Smoking status: Never Smoker   . Smokeless tobacco: Never Used  . Alcohol Use: No   States her 2 grown  daughters live with her   OB History   Grav Para Term Preterm Abortions TAB SAB Ect Mult Living                 Review of Systems  All other systems reviewed and are negative.      Allergies  Fish allergy; Lorazepam; and Sulfa antibiotics  Home Medications   Current Outpatient Rx  Name  Route  Sig  Dispense  Refill  . aspirin 81 MG tablet   Oral   Take 81 mg by mouth daily.           Marland Kitchen ezetimibe-simvastatin (VYTORIN) 10-40 MG per tablet   Oral   Take 1 tablet by mouth daily.   28 tablet   0   . ferrous gluconate (FERGON) 240 (27 FE) MG tablet      Take 2 tablets by mouth every day on an empty stomach with orange juice   100 tablet   2   . letrozole (FEMARA) 2.5 MG tablet   Oral   Take 1 tablet (2.5 mg total) by mouth daily.   30 tablet   2   . lidocaine-prilocaine (EMLA) cream   Topical   Apply 1 application topically as needed. PRIOR TO PORTA-CATH ACCESS   1 each   1   . losartan-hydrochlorothiazide (  HYZAAR) 100-12.5 MG per tablet   Oral   Take 0.5 tablets by mouth daily.          . Lurasidone HCl (LATUDA) 60 MG TABS   Oral   Take 30 mg by mouth daily after supper.         Marland Kitchen omeprazole (PRILOSEC) 20 MG capsule      TAKE 2 CAPSULES DAILY   180 capsule   2   . ondansetron (ZOFRAN) 8 MG tablet      8 mg tablets, take 1-2 tablets every 12 hours as needed for nausea   30 tablet   2   . potassium chloride SA (K-DUR,KLOR-CON) 20 MEQ tablet   Oral   Take 20 mEq by mouth 2 (two) times daily.          . sertraline (ZOLOFT) 25 MG tablet   Oral   Take 25 mg by mouth daily.            BP 140/79  Pulse 97  Temp(Src) 97.9 F (36.6 C) (Oral)  Resp 18  SpO2 99%  Vital signs normal   Physical Exam  Nursing note and vitals reviewed. Constitutional: She is oriented to person, place, and time. She appears well-developed and well-nourished.  Non-toxic appearance. She does not appear ill. No distress.  HENT:  Head: Normocephalic and  atraumatic.  Right Ear: External ear normal.  Left Ear: External ear normal.  Nose: Nose normal. No mucosal edema or rhinorrhea.  Mouth/Throat: Mucous membranes are normal. No dental abscesses or uvula swelling.  Tongue dry  Eyes: Conjunctivae and EOM are normal. Pupils are equal, round, and reactive to light.  Neck: Normal range of motion and full passive range of motion without pain. Neck supple.  Cardiovascular: Normal rate, regular rhythm and normal heart sounds.  Exam reveals no gallop and no friction rub.   No murmur heard. Pulmonary/Chest: Effort normal and breath sounds normal. No respiratory distress. She has no wheezes. She has no rhonchi. She has no rales. She exhibits no tenderness and no crepitus.  Abdominal: Soft. Normal appearance and bowel sounds are normal. She exhibits no distension. There is no tenderness. There is no rebound and no guarding.  Musculoskeletal: Normal range of motion. She exhibits no edema and no tenderness.  Moves all extremities well.   Neurological: She is alert and oriented to person, place, and time. She has normal strength. No cranial nerve deficit.  Skin: Skin is warm, dry and intact. No rash noted. No erythema. No pallor.  Psychiatric: She has a normal mood and affect. Her speech is normal and behavior is normal. Her mood appears not anxious.    ED Course  Procedures (including critical care time)  Medications  0.9 %  sodium chloride infusion ( Intravenous New Bag/Given 05/25/13 1133)  sodium chloride 0.9 % bolus 1,000 mL (not administered)  diazepam (VALIUM) injection 2.5 mg (not administered)  iohexol (OMNIPAQUE) 300 MG/ML solution 50 mL (50 mLs Oral Contrast Given 05/25/13 1045)   12:45 patient given her lab results. We are waiting for her CT abdomen/pelvis to be read. She reports her last couple days she started to get some cramping in her legs. We discussed her metabolic acidosis/alkalosis with new acute renal insufficiency. Her acid base  imbalance is most likely the reason for her leg cramping. She was given low-dose Valium for that. She was started on IV fluids for her dehydration and acute renal failure. We discussed she would need admission once I got the  CT results.  1330 Dr Garwin Brothers admit to [place] Review Results for orders placed during the hospital encounter of 05/25/13  CBC WITH DIFFERENTIAL      Result Value Ref Range   WBC 10.3  4.0 - 10.5 K/uL   RBC 3.86 (*) 3.87 - 5.11 MIL/uL   Hemoglobin 10.8 (*) 12.0 - 15.0 g/dL   HCT 32.8 (*) 36.0 - 46.0 %   MCV 85.0  78.0 - 100.0 fL   MCH 28.0  26.0 - 34.0 pg   MCHC 32.9  30.0 - 36.0 g/dL   RDW 15.0  11.5 - 15.5 %   Platelets 310  150 - 400 K/uL   Neutrophils Relative % 68  43 - 77 %   Lymphocytes Relative 22  12 - 46 %   Monocytes Relative 8  3 - 12 %   Eosinophils Relative 2  0 - 5 %   Basophils Relative 0  0 - 1 %   Neutro Abs 7.0  1.7 - 7.7 K/uL   Lymphs Abs 2.3  0.7 - 4.0 K/uL   Monocytes Absolute 0.8  0.1 - 1.0 K/uL   Eosinophils Absolute 0.2  0.0 - 0.7 K/uL   Basophils Absolute 0.0  0.0 - 0.1 K/uL   Smear Review MORPHOLOGY UNREMARKABLE    COMPREHENSIVE METABOLIC PANEL      Result Value Ref Range   Sodium 132 (*) 137 - 147 mEq/L   Potassium 4.2  3.7 - 5.3 mEq/L   Chloride 95 (*) 96 - 112 mEq/L   CO2 17 (*) 19 - 32 mEq/L   Glucose, Bld 93  70 - 99 mg/dL   BUN 33 (*) 6 - 23 mg/dL   Creatinine, Ser 2.56 (*) 0.50 - 1.10 mg/dL   Calcium 10.0  8.4 - 10.5 mg/dL   Total Protein 8.7 (*) 6.0 - 8.3 g/dL   Albumin 4.0  3.5 - 5.2 g/dL   AST 13  0 - 37 U/L   ALT 6  0 - 35 U/L   Alkaline Phosphatase 83  39 - 117 U/L   Total Bilirubin 0.3  0.3 - 1.2 mg/dL   GFR calc non Af Amer 17 (*) >90 mL/min   GFR calc Af Amer 20 (*) >90 mL/min  POC OCCULT BLOOD, ED      Result Value Ref Range   Fecal Occult Bld NEGATIVE  NEGATIVE   Laboratory interpretation all normal except metabolic acidosis with anion gap of 27, also new acute renal failure compared to results  she had in January, stable mild anemia    Imaging Review Ct Abdomen Pelvis Wo Contrast  05/25/2013   CLINICAL DATA:  Lower abdominal pain, nausea and vomiting. History of ovarian carcinoma.  EXAM: CT ABDOMEN AND PELVIS WITHOUT CONTRAST  TECHNIQUE: Multidetector CT imaging of the abdomen and pelvis was performed following the standard protocol without intravenous contrast.  COMPARISON:  CT ABD/PELVIS W CM dated 07/26/2012  FINDINGS: Lung bases are clear.  No pericardial fluid.  No focal hepatic lesion on this noncontrast exam. Post cholecystectomy. The gallbladder, pancreas, spleen, adrenal glands, kidneys are normal.  The stomach and proximal small bowel are normal. Distal small bowel is minimally dilated to 3.5 cm. No obstructing lesion is identified. Contrast reaches the terminal ileum. There is fluid stool in the entire colon.  Abdominal aorta is normal caliber. No retroperitoneal periportal lymphadenopathy. Multiple calcified periaortic lymph nodes and peritoneal nodules consistent treated metastasis  No free fluid the scrotum smaller  free fluid the pelvis. No pelvic lymphadenopathy. No pelvic lymphadenopathy. No pelvic or peritoneal implants noted.  IMPRESSION: 1. Mild dilation of the distal small bowel consistent with ileus or early partial obstruction. 2. Fluid stool throughout the colon suggests diarrhea. 3. No evidence of peritoneal metastatic recurrence. 4. Small amount free fluid in the pelvis.   Electronically Signed   By: Suzy Bouchard M.D.   On: 05/25/2013 13:16     EKG Interpretation None      MDM   Final diagnoses:  Diarrhea  Nausea and vomiting  Metabolic acidosis  Acute renal failure  Dehydration    Plan admission   Rolland Porter, MD, Alanson Aly, MD 05/25/13 1335

## 2013-05-25 NOTE — Progress Notes (Signed)
Patient has meds from home that need to be locked up in Pharmacy

## 2013-05-25 NOTE — ED Notes (Signed)
Per EMS, pt w/ hx of ovarian cancer. Pt has had diarrhea for last 2 days. Pt denies fever, abdominal pain. Pt vomited and felt nausea at 3AM. Pt took zofran and states she no longer feels nausea and has not vomited since. CBG 104. BP 140/74, HR 90, RR 16, 99% on RA for EMS.

## 2013-05-25 NOTE — ED Notes (Signed)
Attempted to get stool culture and urinalysis. Both specimens contaminated. Will attempt again when pt voids next.

## 2013-05-26 DIAGNOSIS — D638 Anemia in other chronic diseases classified elsewhere: Secondary | ICD-10-CM

## 2013-05-26 DIAGNOSIS — E860 Dehydration: Secondary | ICD-10-CM

## 2013-05-26 LAB — OVA AND PARASITE EXAMINATION: Ova and parasites: NONE SEEN

## 2013-05-26 LAB — BASIC METABOLIC PANEL
BUN: 26 mg/dL — ABNORMAL HIGH (ref 6–23)
CHLORIDE: 101 meq/L (ref 96–112)
CO2: 19 meq/L (ref 19–32)
Calcium: 9.2 mg/dL (ref 8.4–10.5)
Creatinine, Ser: 1.32 mg/dL — ABNORMAL HIGH (ref 0.50–1.10)
GFR calc Af Amer: 45 mL/min — ABNORMAL LOW (ref >90)
GFR calc non Af Amer: 38 mL/min — ABNORMAL LOW (ref >90)
GLUCOSE: 94 mg/dL (ref 70–99)
POTASSIUM: 4 meq/L (ref 3.7–5.3)
SODIUM: 136 meq/L — AB (ref 137–147)

## 2013-05-26 LAB — GI PATHOGEN PANEL BY PCR, STOOL
C DIFFICILE TOXIN A/B: NEGATIVE
Campylobacter by PCR: NEGATIVE
Cryptosporidium by PCR: NEGATIVE
E coli (ETEC) LT/ST: NEGATIVE
E coli (STEC): NEGATIVE
E coli 0157 by PCR: NEGATIVE
G LAMBLIA BY PCR: NEGATIVE
Norovirus GI/GII: NEGATIVE
ROTAVIRUS A BY PCR: NEGATIVE
SALMONELLA BY PCR: NEGATIVE
Shigella by PCR: NEGATIVE

## 2013-05-26 LAB — CBC
HEMATOCRIT: 30.4 % — AB (ref 36.0–46.0)
Hemoglobin: 10.2 g/dL — ABNORMAL LOW (ref 12.0–15.0)
MCH: 28.2 pg (ref 26.0–34.0)
MCHC: 33.6 g/dL (ref 30.0–36.0)
MCV: 84 fL (ref 78.0–100.0)
Platelets: 290 10*3/uL (ref 150–400)
RBC: 3.62 MIL/uL — AB (ref 3.87–5.11)
RDW: 14.6 % (ref 11.5–15.5)
WBC: 9.5 10*3/uL (ref 4.0–10.5)

## 2013-05-26 LAB — CLOSTRIDIUM DIFFICILE BY PCR: Toxigenic C. Difficile by PCR: NEGATIVE

## 2013-05-26 LAB — TSH: TSH: 0.944 u[IU]/mL (ref 0.350–4.500)

## 2013-05-26 MED ORDER — ONDANSETRON HCL 4 MG PO TABS: 4.0000 mg | ORAL_TABLET | ORAL | Status: DC | PRN

## 2013-05-26 MED ORDER — LURASIDONE HCL 20 MG PO TABS
30.0000 mg | ORAL_TABLET | Freq: Every day | ORAL | Status: DC
  Administered 2013-05-27 – 2013-05-28 (×2): 30 mg via ORAL
  Filled 2013-05-26 (×4): qty 2

## 2013-05-26 MED ORDER — FERROUS GLUCONATE 324 (38 FE) MG PO TABS
324.0000 mg | ORAL_TABLET | Freq: Two times a day (BID) | ORAL | Status: DC
  Administered 2013-05-28 (×2): 324 mg via ORAL
  Filled 2013-05-26 (×8): qty 1

## 2013-05-26 MED ORDER — ONDANSETRON HCL 4 MG/2ML IJ SOLN
4.0000 mg | INTRAMUSCULAR | Status: DC | PRN
  Administered 2013-05-26 – 2013-05-27 (×7): 4 mg via INTRAVENOUS
  Filled 2013-05-26 (×7): qty 2

## 2013-05-26 NOTE — Care Management Note (Signed)
   CARE MANAGEMENT NOTE 05/26/2013  Patient:  MARKEDA, NARVAEZ   Account Number:  [id number]  Date Initiated:  05/26/2013  Documentation initiated by:  Ferdie Bakken  Subjective/Objective Assessment:   73 yo female amditted with diarrhea.PCP: [name], MD     Action/Plan:   Home when stable   Anticipated DC Date:     Anticipated DC Plan:        [place]  CM consult      Choice offered to / List presented to:  NA   DME arranged  NA      DME agency  NA     [place] arranged  NA      [place] agency  NA   Status of service:  In process, will continue to follow Medicare Important Message given?   (If response is "NO", the following Medicare IM given date fields will be blank) Date Medicare IM given:   Date Additional Medicare IM given:    Discharge Disposition:    Per UR Regulation:  Reviewed for med. necessity/level of care/duration of stay  If discussed at [place] of Stay Meetings, dates discussed:    Comments:  05/26/13 [place] Chart reviewed for utilization of services. No needs assessed at this time.

## 2013-05-26 NOTE — Progress Notes (Signed)
[facility] PROGRESS NOTE  Jaclyn [name] WFU:[id number] DOB: 1940/08/03 DOA: 05/25/2013 PCP: [name], MD  Brief narrative: 73 yo female with history of ovarian cancer but now in remission, who presented to [facility] ED 05/25/2013 with main concern of several days duration of progressively worsening watery and non bloody diarrhea, 10 - 15 episodes per day, associated with generalized epigastric discomfort, nausea, on episode of non bloody vomiting earlier today and several days duration of poor oral intake.  In ED, pt is hemodynamically stable, noted to have diarrhea. BMP reveled new onset acute renal failure and bump in Cr from baseline of ~1 to now > 2.  Assessment/Plan:  Active Problems:  Diarrhea, nasuea and vomiting - possibly viral or ileus versus obstruction - symptoms improved with antiemetics and IV fluids - C.diff negative Acute renal failure  - likely pre renal in etiology secondary to diarrhea  - improving with IV fluids  Metabolic acidosis  - most likely from poor oral intake and dehydration  - resolved with IV fluids  Anemia of chronic disease  - no signs of active bleeding  - Hg and Hct at pt's baseline, 10.2 - no indications for transfusion  Code Status: Full  Family Communication: family not at the bedside  Disposition Plan: home when stable  Leisa Lenz, MD  [facility] Pager [phone]  If 7PM-7AM, please contact night-coverage [email or web address] Password [email or web address] 05/26/2013, 1:54 PM   LOS: 1 day   Consultants:  None   Procedures:  None   Antibiotics:  None   HPI/Subjective: No vomiting this am. Diarrhea last time around 3 am.   Objective: Filed Vitals:   05/25/13 1444 05/25/13 1544 05/25/13 2052 05/26/13 0521  BP: 145/93 114/67 129/65 122/56  Pulse: 95 90 86 88  Temp:  97.3 F (36.3 C) 98.3 F (36.8 C) 98 F (36.7 C)  TempSrc:   Oral Oral  Resp:   16 20  Height:    5\' 4"  (1.626 m)  SpO2: 100% 97% 97% 100%    Intake/Output  Summary (Last 24 hours) at 05/26/13 1354 Last data filed at 05/26/13 0347  Gross per 24 hour  Intake    360 ml  Output    650 ml  Net   -290 ml    Exam:   General:  Pt is alert, follows commands appropriately, not in acute distress  Cardiovascular: Regular rate and rhythm, S1/S2 appreciated   Respiratory: Clear to auscultation bilaterally, no wheezing, no crackles, no rhonchi  Abdomen: Soft, non tender, non distended, bowel sounds present, no guarding  Extremities: No edema, pulses DP and PT palpable bilaterally  Neuro: Grossly nonfocal  Data Reviewed: Basic Metabolic Panel:  Recent Labs Lab 05/25/13 1127 05/26/13 0530  NA 132* 136*  K 4.2 4.0  CL 95* 101  CO2 17* 19  GLUCOSE 93 94  BUN 33* 26*  CREATININE 2.56* 1.32*  CALCIUM 10.0 9.2   Liver Function Tests:  Recent Labs Lab 05/25/13 1127  AST 13  ALT 6  ALKPHOS 83  BILITOT 0.3  PROT 8.7*  ALBUMIN 4.0   No results found for this basename: LIPASE, AMYLASE,  in the last 168 hours No results found for this basename: AMMONIA,  in the last 168 hours CBC:  Recent Labs Lab 05/25/13 1127 05/26/13 0530  WBC 10.3 9.5  NEUTROABS 7.0  --   HGB 10.8* 10.2*  HCT 32.8* 30.4*  MCV 85.0 84.0  PLT 310 290   Cardiac Enzymes: No results found for this  basename: CKTOTAL, CKMB, CKMBINDEX, TROPONINI,  in the last 168 hours BNP: No components found with this basename: POCBNP,  CBG: No results found for this basename: GLUCAP,  in the last 168 hours  Recent Results (from the past 240 hour(s))  CLOSTRIDIUM DIFFICILE BY PCR     Status: None   Collection Time    05/25/13  7:40 PM      Result Value Ref Range Status   C difficile by pcr NEGATIVE  NEGATIVE Final   Comment: Performed at [facility]     Studies: Ct Abdomen Pelvis Wo Contrast  05/25/2013   CLINICAL DATA:  Lower abdominal pain, nausea and vomiting. History of ovarian carcinoma.  EXAM: CT ABDOMEN AND PELVIS WITHOUT CONTRAST  TECHNIQUE:  Multidetector CT imaging of the abdomen and pelvis was performed following the standard protocol without intravenous contrast.  COMPARISON:  CT ABD/PELVIS W CM dated 07/26/2012  FINDINGS: Lung bases are clear.  No pericardial fluid.  No focal hepatic lesion on this noncontrast exam. Post cholecystectomy. The gallbladder, pancreas, spleen, adrenal glands, kidneys are normal.  The stomach and proximal small bowel are normal. Distal small bowel is minimally dilated to 3.5 cm. No obstructing lesion is identified. Contrast reaches the terminal ileum. There is fluid stool in the entire colon.  Abdominal aorta is normal caliber. No retroperitoneal periportal lymphadenopathy. Multiple calcified periaortic lymph nodes and peritoneal nodules consistent treated metastasis  No free fluid the scrotum smaller free fluid the pelvis. No pelvic lymphadenopathy. No pelvic lymphadenopathy. No pelvic or peritoneal implants noted.  IMPRESSION: 1. Mild dilation of the distal small bowel consistent with ileus or early partial obstruction. 2. Fluid stool throughout the colon suggests diarrhea. 3. No evidence of peritoneal metastatic recurrence. 4. Small amount free fluid in the pelvis.   Electronically Signed   By: Suzy Bouchard M.D.   On: 05/25/2013 13:16    Scheduled Meds: . aspirin  81 mg Oral Daily  . enoxaparin (LOVENOX) injection  30 mg Subcutaneous Q24H  . ezetimibe-simvastatin  1 tablet Oral Daily  . ferrous gluconate  240 mg Oral BID WC  . losartan  50 mg Oral Daily   And  . hydrochlorothiazide  6.25 mg Oral Daily  . letrozole  2.5 mg Oral Daily  . Lurasidone HCl  30 mg Oral Daily  . Memantine HCl ER  28 mg Oral Daily  . pantoprazole  40 mg Oral Daily  . potassium chloride SA  20 mEq Oral BID WC  . sertraline  25 mg Oral QHS   Continuous Infusions: . sodium chloride 75 mL/hr (05/26/13 0140)

## 2013-05-27 MED ORDER — LOPERAMIDE HCL 1 MG/5ML PO LIQD
1.0000 mg | ORAL | Status: DC | PRN
  Administered 2013-05-27: 1 mg via ORAL
  Filled 2013-05-27: qty 5

## 2013-05-27 NOTE — Progress Notes (Addendum)
[facility] PROGRESS NOTE  Jaclyn [name] MVH:[id number] DOB: Oct 23, 1940 DOA: 05/25/2013 PCP: [name], MD  Brief narrative: 73 yo female with history of ovarian cancer but now in remission, who presented to [facility] ED 05/25/2013 with main concern of several days duration of progressively worsening watery and non bloody diarrhea, 10 - 15 episodes per day, associated with generalized epigastric discomfort, nausea, on episode of non bloody vomiting earlier today and several days duration of poor oral intake.  In ED, pt is hemodynamically stable, noted to have diarrhea. BMP reveled new onset acute renal failure and bump in Cr from baseline of ~1 to > 2 on admission.  Assessment/Plan:   Active Problems:  Diarrhea, nasuea and vomiting  - possibly viral or ileus versus obstruction  - symptoms improving with antiemetics and IV fluids  - she still has diarrhea but C.diff neg, stool culture negative; follow up norovirus - may use imodium PRN  Hypertension - continue losartan and Hctz Acute renal failure  - likely pre renal in etiology secondary to diarrhea  - improving with IV fluids  - BMP in am Metabolic acidosis  - most likely from poor oral intake and dehydration  - resolved with IV fluids  Anemia of chronic disease  - no signs of active bleeding  - Hg and Hct at pt's baseline, 10.2  - no indications for transfusion   Code Status: Full  Family Communication: family not at the bedside  Disposition Plan: home when stable   Consultants:  None  Procedures:  None  Antibiotics:  None    Leisa Lenz, MD  [facility] Pager [phone]  If 7PM-7AM, please contact night-coverage [email or web address] Password [facility] 05/27/2013, 5:42 PM   LOS: 2 days   HPI/Subjective: No acute overnight events.   Objective: Filed Vitals:   05/26/13 2107 05/27/13 0500 05/27/13 1104 05/27/13 1354  BP: 129/70 130/67 136/68 122/56  Pulse: 87 78 82 90  Temp: 98.1 F (36.7 C) 98.1 F (36.7 C)   98.6 F (37 C)  TempSrc: Oral Oral  Oral  Resp: 16 16  20   Height:      Weight:      SpO2: 97% 96%  97%    Intake/Output Summary (Last 24 hours) at 05/27/13 1742 Last data filed at 05/27/13 1553  Gross per 24 hour  Intake   1160 ml  Output    550 ml  Net    610 ml    Exam:   General:  Pt is alert, follows commands appropriately, not in acute distress  Cardiovascular: Regular rate and rhythm, S1/S2 appreciated   Respiratory: Clear to auscultation bilaterally, no wheezing, no crackles, no rhonchi  Abdomen: Soft, non tender, non distended, bowel sounds present, no guarding  Extremities: No edema, pulses DP and PT palpable bilaterally  Neuro: Grossly nonfocal  Data Reviewed: Basic Metabolic Panel:  Recent Labs Lab 05/25/13 1127 05/26/13 0530  NA 132* 136*  K 4.2 4.0  CL 95* 101  CO2 17* 19  GLUCOSE 93 94  BUN 33* 26*  CREATININE 2.56* 1.32*  CALCIUM 10.0 9.2   Liver Function Tests:  Recent Labs Lab 05/25/13 1127  AST 13  ALT 6  ALKPHOS 83  BILITOT 0.3  PROT 8.7*  ALBUMIN 4.0   No results found for this basename: LIPASE, AMYLASE,  in the last 168 hours No results found for this basename: AMMONIA,  in the last 168 hours CBC:  Recent Labs Lab 05/25/13 1127 05/26/13 0530  WBC 10.3 9.5  NEUTROABS 7.0  --   HGB 10.8* 10.2*  HCT 32.8* 30.4*  MCV 85.0 84.0  PLT 310 290   Cardiac Enzymes: No results found for this basename: CKTOTAL, CKMB, CKMBINDEX, TROPONINI,  in the last 168 hours BNP: No components found with this basename: POCBNP,  CBG: No results found for this basename: GLUCAP,  in the last 168 hours  Recent Results (from the past 240 hour(s))  STOOL CULTURE     Status: None   Collection Time    05/25/13  7:40 PM      Result Value Ref Range Status   Specimen Description STOOL   Final   Special Requests NONE   Final   Culture     Final   Value: NO SUSPICIOUS COLONIES, CONTINUING TO HOLD     Performed at [facility]   Report  Status PENDING   Incomplete  URINE CULTURE     Status: None   Collection Time    05/25/13  7:40 PM      Result Value Ref Range Status   Specimen Description URINE, CLEAN CATCH   Final   Special Requests NONE   Final   Culture  Setup Time     Final   Value: 05/26/2013 01:19     Performed at [place]     Final   Value: >=100,000 COLONIES/ML     Performed at [facility]   Culture     Final   Value: [place]     Performed at [facility]   Report Status PENDING   Incomplete  OVA AND PARASITE EXAMINATION     Status: None   Collection Time    05/25/13  7:40 PM      Result Value Ref Range Status   Specimen Description STOOL   Final   Special Requests NONE   Final   Ova and parasites     Final   Value: NO OVA OR PARASITES SEEN     Performed at [facility]   Report Status 05/26/2013 FINAL   Final  CLOSTRIDIUM DIFFICILE BY PCR     Status: None   Collection Time    05/25/13  7:40 PM      Result Value Ref Range Status   C difficile by pcr NEGATIVE  NEGATIVE Final   Comment: Performed at [facility]     Studies: No results found.  Scheduled Meds: . aspirin  81 mg Oral Daily  . enoxaparin (LOVENOX)   30 mg Subcutaneous Q24H  . ezetimibe-simvastatin  1 tablet Oral Daily  . ferrous gluconate  324 mg Oral BID WC  . losartan  50 mg Oral Daily   And  . hydrochlorothiazide  6.25 mg Oral Daily  . letrozole  2.5 mg Oral Daily  . Memantine HCl ER  28 mg Oral Daily  . pantoprazole  40 mg Oral Daily  . potassium chloride SA  20 mEq Oral BID WC  . sertraline  25 mg Oral QHS   Continuous Infusions: . sodium chloride 75 mL/hr at 05/27/13 1539

## 2013-05-28 DIAGNOSIS — C569 Malignant neoplasm of unspecified ovary: Secondary | ICD-10-CM

## 2013-05-28 LAB — URINE CULTURE: Colony Count: >=100000

## 2013-05-28 LAB — BASIC METABOLIC PANEL
BUN: 19 mg/dL (ref 6–23)
CHLORIDE: 104 meq/L (ref 96–112)
CO2: 22 mEq/L (ref 19–32)
Calcium: 9.3 mg/dL (ref 8.4–10.5)
Creatinine, Ser: 0.84 mg/dL (ref 0.50–1.10)
GFR calc Af Amer: 77 mL/min — ABNORMAL LOW (ref >90)
GFR, EST NON AFRICAN AMERICAN: 66 mL/min — AB (ref >90)
Glucose, Bld: 115 mg/dL — ABNORMAL HIGH (ref 70–99)
Potassium: 3.2 mEq/L — ABNORMAL LOW (ref 3.7–5.3)
SODIUM: 140 meq/L (ref 137–147)

## 2013-05-28 MED ORDER — DEXTROSE 5 % IV SOLN
1.0000 g | INTRAVENOUS | Status: DC
  Administered 2013-05-28: 1 g via INTRAVENOUS
  Filled 2013-05-28 (×2): qty 10

## 2013-05-28 MED ORDER — DIPHENOXYLATE-ATROPINE 2.5-0.025 MG PO TABS
2.0000 | ORAL_TABLET | Freq: Four times a day (QID) | ORAL | Status: DC
  Administered 2013-05-28 – 2013-05-29 (×5): 2 via ORAL
  Filled 2013-05-28: qty 1
  Filled 2013-05-28 (×2): qty 2
  Filled 2013-05-28: qty 1
  Filled 2013-05-28 (×2): qty 2

## 2013-05-28 MED ORDER — ENOXAPARIN SODIUM 40 MG/0.4ML ~~LOC~~ SOLN
40.0000 mg | SUBCUTANEOUS | Status: DC
  Administered 2013-05-28: 40 mg via SUBCUTANEOUS
  Filled 2013-05-28 (×2): qty 0.4

## 2013-05-28 NOTE — Progress Notes (Addendum)
[facility] PROGRESS NOTE  Jaclyn [name] JWJ:[id number] DOB: 07/22/1940 DOA: 05/25/2013 PCP: Jaclyn Peers, MD  Brief narrative: 73 yo female with history of ovarian cancer but now in remission, who presented to [facility] ED 05/25/2013 with main concern of several days duration of progressively worsening watery and non bloody diarrhea, 10 - 15 episodes per day, associated with generalized epigastric discomfort, nausea, on episode of non bloody vomiting earlier today and several days duration of poor oral intake.  In ED, pt is hemodynamically stable, noted to have diarrhea. BMP reveled new onset acute renal failure and bump in Cr from baseline of ~1 to > 2 on admission. Hospital course complicated due to ongoing diarrhea wven with antidiarrheal medication but all stool studies are so far negative.   Assessment/Plan:   Active Problems:  Diarrhea, nasuea and vomiting  - possibly viral gastroenteritis - symptoms improving slowly improving. No nausea or vomiting but persistent diarrhea  - all stool studies negative - use imodium and added lomotil today   Hypertension  - continue losartan and Hctz  Acute renal failure  - likely pre renal in etiology secondary to diarrhea  - resolved with IV fluids  Metabolic acidosis  - most likely from poor oral intake and dehydration  - resolved with IV fluids  Anemia of chronic disease  - no signs of active bleeding  - Hg and Hct at pt's baseline, 10.2  - no indications for transfusion  UTI - gram negative rods; identified now as Klebsiella and Enterobacter - sens to rocephin so will start rocephin 1 gm daily IV  Code Status: Full  Family Communication: family not at the bedside  Disposition Plan: home when stable   Consultants:  None  Procedures:  None  Antibiotics:  None   Leisa Lenz, MD  [facility] Pager [phone]  If 7PM-7AM, please contact night-coverage [email or web address] Password [facility] 05/28/2013, 10:32 AM   LOS: 3 days     HPI/Subjective: Still has diarrhea this am.  Objective: Filed Vitals:   05/27/13 1104 05/27/13 1354 05/27/13 2059 05/28/13 0450  BP: 136/68 122/56 142/56 140/65  Pulse: 82 90 89 90  Temp:  98.6 F (37 C) 99.2 F (37.3 C) 98.4 F (36.9 C)  TempSrc:  Oral Oral Oral  Resp:  20 20 18   Height:      Weight:      SpO2:  97% 98% 99%    Intake/Output Summary (Last 24 hours) at 05/28/13 1032 Last data filed at 05/28/13 0451  Gross per 24 hour  Intake    960 ml  Output    500 ml  Net    460 ml    Exam:  General: Pt is alert, follows commands appropriately, not in acute distress  Cardiovascular: Regular rate and rhythm, S1/S2 appreciated  Respiratory: Clear to auscultation bilaterally, no wheezing, no crackles, no rhonchi  Abdomen: Soft, non tender, non distended, bowel sounds present, no guarding  Extremities: No edema, pulses DP and PT palpable bilaterally  Neuro: Grossly nonfocal   Data Reviewed: Basic Metabolic Panel:  Recent Labs Lab 05/25/13 1127 05/26/13 0530 05/28/13 0515  NA 132* 136* 140  K 4.2 4.0 3.2*  CL 95* 101 104  CO2 17* 19 22  GLUCOSE 93 94 115*  BUN 33* 26* 19  CREATININE 2.56* 1.32* 0.84  CALCIUM 10.0 9.2 9.3   Liver Function Tests:  Recent Labs Lab 05/25/13 1127  AST 13  ALT 6  ALKPHOS 83  BILITOT 0.3  PROT 8.7*  ALBUMIN 4.0   No results found for this basename: LIPASE, AMYLASE,  in the last 168 hours No results found for this basename: AMMONIA,  in the last 168 hours CBC:  Recent Labs Lab 05/25/13 1127 05/26/13 0530  WBC 10.3 9.5  NEUTROABS 7.0  --   HGB 10.8* 10.2*  HCT 32.8* 30.4*  MCV 85.0 84.0  PLT 310 290   Cardiac Enzymes: No results found for this basename: CKTOTAL, CKMB, CKMBINDEX, TROPONINI,  in the last 168 hours BNP: No components found with this basename: POCBNP,  CBG: No results found for this basename: GLUCAP,  in the last 168 hours  STOOL CULTURE     Status: None   Collection Time    05/25/13  7:40  PM      Result Value Ref Range Status   Specimen Description STOOL   Final   Special Requests NONE   Final   Culture     Final   Value: NO SUSPICIOUS COLONIES, CONTINUING TO HOLD     Performed at [facility]   Report Status PENDING   Incomplete  URINE CULTURE     Status: None   Collection Time    05/25/13  7:40 PM      Result Value Ref Range Status   Specimen Description URINE, CLEAN CATCH   Final   Value: GRAM NEGATIVE RODS     Performed at [facility]   Report Status PENDING   Incomplete  OVA AND PARASITE EXAMINATION     Status: None   Collection Time    05/25/13  7:40 PM      Result Value Ref Range Status   Specimen Description STOOL   Final   Special Requests NONE   Final   Ova and parasites     Final   Value: NO OVA OR PARASITES SEEN     Performed at [facility]   Report Status 05/26/2013 FINAL   Final  CLOSTRIDIUM DIFFICILE BY PCR     Status: None   Collection Time    05/25/13  7:40 PM      Result Value Ref Range Status   C difficile by pcr NEGATIVE  NEGATIVE Final   Comment: Performed at [facility]     Studies: No results found.  Scheduled Meds: . aspirin  81 mg Oral Daily  . diphenoxylate-atropine  2 tablet Oral QID  . enoxaparin (LOVENOX) i  30 mg Subcutaneous Q24H  . ezetimibe-simvastatin  1 tablet Oral Daily  . ferrous gluconate  324 mg Oral BID WC  . losartan  50 mg Oral Daily  . hydrochlorothiazide  6.25 mg Oral Daily  . letrozole  2.5 mg Oral Daily  . Lurasidone HCl  30 mg Oral Q supper  . Memantine HCl ER  28 mg Oral Daily  . pantoprazole  40 mg Oral Daily  . potassium chloride SA  20 mEq Oral BID WC  . sertraline  25 mg Oral QHS   Continuous Infusions: . sodium chloride 75 mL/hr at 05/27/13 1539

## 2013-05-29 LAB — STOOL CULTURE

## 2013-05-29 LAB — BASIC METABOLIC PANEL
BUN: 13 mg/dL (ref 6–23)
CO2: 22 mEq/L (ref 19–32)
Calcium: 8.5 mg/dL (ref 8.4–10.5)
Chloride: 103 mEq/L (ref 96–112)
Creatinine, Ser: 0.73 mg/dL (ref 0.50–1.10)
GFR calc Af Amer: >90 mL/min (ref >90)
GFR, EST NON AFRICAN AMERICAN: 82 mL/min — AB (ref >90)
Glucose, Bld: 98 mg/dL (ref 70–99)
Potassium: 3.1 mEq/L — ABNORMAL LOW (ref 3.7–5.3)
SODIUM: 138 meq/L (ref 137–147)

## 2013-05-29 MED ORDER — DIPHENOXYLATE-ATROPINE 2.5-0.025 MG PO TABS: 2.0000 | ORAL_TABLET | Freq: Four times a day (QID) | ORAL | Status: DC

## 2013-05-29 MED ORDER — LOPERAMIDE HCL 1 MG/5ML PO LIQD: 1.0000 mg | ORAL | Status: DC | PRN

## 2013-05-29 MED ORDER — LEVOFLOXACIN 750 MG PO TABS
750.0000 mg | ORAL_TABLET | Freq: Every day | ORAL | Status: DC
Start: 2013-05-29 — End: 2013-06-24

## 2013-05-29 MED ORDER — EZETIMIBE-SIMVASTATIN 10-40 MG PO TABS: 1.0000 | ORAL_TABLET | Freq: Every day | ORAL | Status: AC

## 2013-05-29 MED ORDER — HYDROCODONE-ACETAMINOPHEN 5-325 MG PO TABS: 1.0000 | ORAL_TABLET | ORAL | Status: DC | PRN

## 2013-05-29 MED ORDER — HEPARIN SOD (PORK) LOCK FLUSH 100 UNIT/ML IV SOLN
500.0000 [IU] | INTRAVENOUS | Status: DC | PRN
  Filled 2013-05-29: qty 5

## 2013-05-29 NOTE — Progress Notes (Signed)
Pt. Told nurse tech that she would be unable to get any food this evening after being discharged.  Pt. Was further questioned pt. Stated that she had no problem obtaining food regularly and that she had a sufficient amount of food at home but that she did not like any of the food she had at home.  Will continue to monitor.

## 2013-05-29 NOTE — Evaluation (Signed)
Physical Therapy Evaluation Patient Details Name: Jaclyn [name] MRN: [id number] DOB: [birth year] Today's Date: 05/29/2013 Time: 3267-1245 PT Time Calculation (min): 18 min  PT Assessment / Plan / Recommendation History of Present Illness  73 yo female admitted with diarrhea  Clinical Impression  On eval, pt required Min guard assist for mobility-able to ambulate ~200 feet walker. Recommend HHPT and walker use at home until pt returns to baseline.     PT Assessment  Patient needs continued PT services    Follow Up Recommendations  Home health PT    Does the patient have the potential to tolerate intense rehabilitation      Barriers to Discharge        Equipment Recommendations   (pt states she already has walker)    Recommendations for Other Services     Frequency Min 3X/week    Precautions / Restrictions Precautions Precautions: Fall Restrictions Weight Bearing Restrictions: No   Pertinent Vitals/Pain No c/o pain      Mobility  Bed Mobility Overal bed mobility: Modified Independent Transfers Overall transfer level: Needs assistance Transfers: Sit to/from Stand Sit to Stand: Supervision General transfer comment: safety Ambulation/Gait Ambulation/Gait assistance: Min guard Ambulation Distance (Feet): 200 Feet Assistive device: Rolling walker (2 wheeled) Gait Pattern/deviations: Step-through pattern;Decreased stride length General Gait Details: close guard for safety    Exercises     PT Diagnosis: Difficulty walking  PT Problem List: Decreased strength;Decreased mobility;Decreased balance PT Treatment Interventions: DME instruction;Gait training;Functional mobility training;Therapeutic activities;Therapeutic exercise;Patient/family education;Balance training     PT Goals(Current goals can be found in the care plan section) Acute Rehab PT Goals Patient Stated Goal: home today PT Goal Formulation: With patient Time For Goal Achievement:  06/12/13 Potential to Achieve Goals: Good  Visit Information  Last PT Received On: 05/29/13 Assistance Needed: +1 History of Present Illness: 73 yo female admitted with diarrhea       Prior [place] expects to be discharged to:: Private residence Living Arrangements: Children Type of Home: [place] Access: Stairs to enter Technical brewer of Steps: 2 Home Layout: One [place]: Environmental consultant - 2 wheels Prior Function Level of Independence: Independent Communication Communication: No difficulties    Cognition  Cognition Arousal/Alertness: Awake/alert Behavior During Therapy: WFL for tasks assessed/performed Overall Cognitive Status: Within Functional Limits for tasks assessed    Extremity/Trunk Assessment Upper Extremity Assessment Upper Extremity Assessment: Overall WFL for tasks assessed Lower Extremity Assessment Lower Extremity Assessment: Generalized weakness Cervical / Trunk Assessment Cervical / Trunk Assessment: Normal   Balance    End of Session PT - End of Session Equipment Utilized During Treatment: Gait belt Activity Tolerance: Patient tolerated treatment well Patient left: in chair;with call bell/phone within reach  GP     Weston Anna, MPT Pager: [phone]

## 2013-05-29 NOTE — Progress Notes (Signed)
Order for pt. Discharge were placed.  Pt. And daughter were informed, pt. Daughter stated she would be able to transport her mother home after she got off of work around 1730. Pt. Was given her discharge instructions, prescriptions, and all questions were answered.

## 2013-05-29 NOTE — Discharge Summary (Signed)
Physician Discharge Summary  Jaclyn [name] [id number] DOB: 08-03-1940 DOA: 05/25/2013  PCP: [name], MD  Admit date: 05/25/2013 Discharge date: 05/29/2013  Recommendations for Outpatient Follow-up:  1. Please followup with primary care physician in one week after discharge to make sure symptoms are controlled. 2. Please have your PCP recheck potassium during your next visit. Potassium is 3.1 prior to discharge but you can continue your potassium supplements as before. 3. Kidney function is within normal limits prior to discharge 4. You may continue Lomotil or Imodium as needed to control diarrhea. When you followup with your primary care physician let him now if your diarrhea has improved. 5. Please continue Levaquin for 7 days for urinary tract infection  Discharge Diagnoses:  Active Problems:   Diarrhea    Discharge Condition: Medically stable for discharge home today  Diet recommendation: As tolerated  History of present illness:  73 yo female with history of ovarian cancer but now in remission, who presented to [facility] ED 05/25/2013 with main concern of several days duration of progressively worsening watery and non bloody diarrhea, 10 - 15 episodes per day, associated with generalized epigastric discomfort, nausea, on episode of non bloody vomiting earlier today and several days duration of poor oral intake.  In ED, pt is hemodynamically stable, noted to have diarrhea. BMP reveled new onset acute renal failure and bump in Cr from baseline of ~1 to > 2 on admission. Hospital course complicated due to ongoing diarrhea wven with antidiarrheal medication but all stool studies are so far negative. Urine culture was positive for Klebsiella and enterococcus species.  Assessment/Plan:   Active Problems:  Diarrhea, nasuea and vomiting  - possibly viral gastroenteritis  - symptoms improving slowly improving.  - all stool studies negative  - use imodium and Lomotil as  needed Hypertension  - continue losartan and Hctz  Acute renal failure  - likely pre renal in etiology secondary to diarrhea  - resolved with IV fluids  Metabolic acidosis  - most likely from poor oral intake and dehydration  - resolved with IV fluids  Anemia of chronic disease  - no signs of active bleeding  - Hg and Hct at pt's baseline, 10.2  - no indications for transfusion  UTI  - gram negative rods; identified now as Klebsiella and Enterobacter  - sens to rocephin which was started while patient was in hospital. On discharge, recommendation is for Levaquin for 7 days on discharge  Code Status: Full  Family Communication: family not at the bedside    Consultants:  None  Procedures:  None  Antibiotics:  None    Hospital Course:  Active Problems:   Diarrhea    Signed:  Leisa Lenz, MD  [facility] 05/29/2013, 12:00 PM  Pager #: [phone]   Discharge Exam: Filed Vitals:   05/29/13 0628  BP: 122/59  Pulse: 82  Temp: 98.3 F (36.8 C)  Resp: 16   Filed Vitals:   05/28/13 0450 05/28/13 1415 05/28/13 2320 05/29/13 0628  BP: 140/65 131/53 128/50 122/59  Pulse: 90 94 90 82  Temp: 98.4 F (36.9 C) 99.5 F (37.5 C) 99.1 F (37.3 C) 98.3 F (36.8 C)  TempSrc: Oral Oral Oral Oral  Resp: 18 18 20 16   Height:      Weight:      SpO2: 99% 97% 100% 98%    General: Pt is alert, follows commands appropriately, not in acute distress Cardiovascular: Regular rate and rhythm, S1/S2 +, no murmurs, no rubs,  no gallops Respiratory: Clear to auscultation bilaterally, no wheezing, no crackles, no rhonchi Abdominal: Soft, non tender, non distended, bowel sounds +, no guarding Extremities: no edema, no cyanosis, pulses palpable bilaterally DP and PT Neuro: Grossly nonfocal  Discharge Instructions  Discharge Orders   Future Appointments Provider Department Dept Phone   06/05/2013 11:30 AM [facility]  [phone]   06/05/2013 12:00 PM [facility] [phone]   07/10/2013 1:45 PM Wallene Huh, [place] [facility] [place]   07/13/2013 10:30 AM Janie Morning, MD [facility] [phone]   07/17/2013 12:00 PM [facility] [phone]   07/17/2013 12:30 PM [facility] [phone]   07/17/2013 1:00 PM Gordy Levan, [facility] [phone]   09/08/2013 1:15 PM [facility] [phone]   09/08/2013 1:30 PM [facility] Nurse [facility] [phone]   09/08/2013 2:00 PM Gordy Levan, MD [facility] [phone]   Future Orders Complete By Expires   Call MD for:  difficulty breathing, headache or visual disturbances  As directed    Call MD for:  persistant dizziness or light-headedness  As directed    Call MD for:  persistant nausea and vomiting  As directed    Call MD for:  severe uncontrolled pain  As directed    Diet - low sodium heart healthy  As directed    Discharge instructions  As directed    Comments:     1. Please followup with primary care physician in one week after discharge to make sure symptoms are controlled. 2. Please have your PCP recheck potassium during your next visit. Potassium is 3.1 prior to discharge but you can continue your potassium supplements as before. 3. Kidney function is within normal limits prior to discharge 4. You may continue Lomotil or Imodium as needed to control diarrhea. When you followup with your primary care physician let him now if your diarrhea has improved. 5. Please continue Levaquin for 7 days for urinary tract infection   Increase activity slowly  As directed        Medication  List         aspirin 81 MG tablet  Take 81 mg by mouth daily.     diphenoxylate-atropine 2.5-0.025 MG per tablet  Commonly known as:  LOMOTIL  Take 2 tablets by mouth 4 (four) times daily.     ezetimibe-simvastatin 10-40 MG per tablet  Commonly known as:  VYTORIN  Take 1 tablet by mouth daily.     ferrous gluconate 240 (27 FE) MG tablet  Commonly known as:  FERGON  Take 2 tablets by mouth every day on an empty stomach with orange juice     HYDROcodone-acetaminophen 5-325 MG per tablet  Commonly known as:  NORCO/VICODIN  Take 1-2 tablets by mouth every 4 (four) hours as needed for moderate pain.     LATUDA 40 MG Tabs tablet  Generic drug:  lurasidone  Take 30 mg by mouth daily with breakfast.     letrozole 2.5 MG tablet  Commonly known as:  FEMARA  Take 1 tablet (2.5 mg total) by mouth daily.     levofloxacin 750 MG tablet  Commonly known as:  LEVAQUIN  Take 1 tablet (  750 mg total) by mouth daily.     lidocaine-prilocaine cream  Commonly known as:  EMLA  Apply 1 application topically as needed. PRIOR TO PORTA-CATH ACCESS     loperamide 1 MG/5ML solution  Commonly known as:  IMODIUM  Take 5 mLs (1 mg total) by mouth as needed for diarrhea or loose stools.     losartan-hydrochlorothiazide 100-12.5 MG per tablet  Commonly known as:  HYZAAR  Take 0.5 tablets by mouth. Mon, Wed, Fri only     NAMENDA XR 28 MG Cp24  Generic drug:  Memantine HCl ER  Take 1 tablet by mouth daily.     omeprazole 20 MG capsule  Commonly known as:  PRILOSEC  Take 20 mg by mouth 2 (two) times daily before a meal.     ondansetron 8 MG tablet  Commonly known as:  ZOFRAN  8 mg tablets, take 1-2 tablets every 12 hours as needed for nausea     potassium chloride SA 20 MEQ tablet  Commonly known as:  K-DUR,KLOR-CON  Take 20 mEq by mouth 2 (two) times daily.     sertraline 25 MG tablet  Commonly known as:  ZOLOFT  Take 25 mg by mouth at bedtime.           Follow-up Information    Follow up with [name], MD. Schedule an appointment as soon as possible for a visit in 1 week.   Specialty:  Family Medicine   Contact information:   [address] [place] [id number] [phone]        The results of significant diagnostics from this hospitalization (including imaging, microbiology, ancillary and laboratory) are listed below for reference.    Significant Diagnostic Studies: Ct Abdomen Pelvis Wo Contrast  05/25/2013   CLINICAL DATA:  Lower abdominal pain, nausea and vomiting. History of ovarian carcinoma.  EXAM: CT ABDOMEN AND PELVIS WITHOUT CONTRAST  TECHNIQUE: Multidetector CT imaging of the abdomen and pelvis was performed following the standard protocol without intravenous contrast.  COMPARISON:  CT ABD/PELVIS W CM dated 07/26/2012  FINDINGS: Lung bases are clear.  No pericardial fluid.  No focal hepatic lesion on this noncontrast exam. Post cholecystectomy. The gallbladder, pancreas, spleen, adrenal glands, kidneys are normal.  The stomach and proximal small bowel are normal. Distal small bowel is minimally dilated to 3.5 cm. No obstructing lesion is identified. Contrast reaches the terminal ileum. There is fluid stool in the entire colon.  Abdominal aorta is normal caliber. No retroperitoneal periportal lymphadenopathy. Multiple calcified periaortic lymph nodes and peritoneal nodules consistent treated metastasis  No free fluid the scrotum smaller free fluid the pelvis. No pelvic lymphadenopathy. No pelvic lymphadenopathy. No pelvic or peritoneal implants noted.  IMPRESSION: 1. Mild dilation of the distal small bowel consistent with ileus or early partial obstruction. 2. Fluid stool throughout the colon suggests diarrhea. 3. No evidence of peritoneal metastatic recurrence. 4. Small amount free fluid in the pelvis.   Electronically Signed   By: Suzy Bouchard M.D.   On: 05/25/2013 13:16    Microbiology: Recent Results (from the past 240 hour(s))  STOOL  CULTURE     Status: None   Collection Time    05/25/13  7:40 PM      Result Value Ref Range Status   Specimen Description STOOL   Final   Special Requests NONE   Final   Culture     Final   Value: NO SALMONELLA, SHIGELLA, CAMPYLOBACTER, YERSINIA, OR E.COLI 0157:H7 ISOLATED  Performed at [facility]   Report Status 05/29/2013 FINAL   Final  URINE CULTURE     Status: None   Collection Time    05/25/13  7:40 PM      Result Value Ref Range Status   Specimen Description URINE, CLEAN CATCH   Final   Special Requests NONE   Final   Culture  Setup Time     Final   Value: 05/26/2013 01:19     Performed at [place]     Final   Value: >=100,000 COLONIES/ML     Performed at [facility]   Culture     Final   Value: KLEBSIELLA ORNITHINOLYTICA     ENTEROBACTER CLOACAE     Performed at [facility]   Report Status 05/28/2013 FINAL   Final   Organism ID, Bacteria KLEBSIELLA ORNITHINOLYTICA   Final   Organism ID, Bacteria ENTEROBACTER CLOACAE   Final  OVA AND PARASITE EXAMINATION     Status: None   Collection Time    05/25/13  7:40 PM      Result Value Ref Range Status   Specimen Description STOOL   Final   Special Requests NONE   Final   Ova and parasites     Final   Value: NO OVA OR PARASITES SEEN     Performed at [facility]   Report Status 05/26/2013 FINAL   Final  CLOSTRIDIUM DIFFICILE BY PCR     Status: None   Collection Time    05/25/13  7:40 PM      Result Value Ref Range Status   C difficile by pcr NEGATIVE  NEGATIVE Final   Comment: Performed at [place]: Basic Metabolic Panel:  Recent Labs Lab 05/25/13 1127 05/26/13 0530 05/28/13 0515 05/29/13 1055  NA 132* 136* 140 138  K 4.2 4.0 3.2* 3.1*  CL 95* 101 104 103  CO2 17* 19 22 22   GLUCOSE 93 94 115* 98  BUN 33* 26* 19 13  CREATININE 2.56* 1.32* 0.84 0.73  CALCIUM 10.0 9.2 9.3 8.5   Liver Function Tests:  Recent Labs Lab  05/25/13 1127  AST 13  ALT 6  ALKPHOS 83  BILITOT 0.3  PROT 8.7*  ALBUMIN 4.0   No results found for this basename: LIPASE, AMYLASE,  in the last 168 hours No results found for this basename: AMMONIA,  in the last 168 hours CBC:  Recent Labs Lab 05/25/13 1127 05/26/13 0530  WBC 10.3 9.5  NEUTROABS 7.0  --   HGB 10.8* 10.2*  HCT 32.8* 30.4*  MCV 85.0 84.0  PLT 310 290   Cardiac Enzymes: No results found for this basename: CKTOTAL, CKMB, CKMBINDEX, TROPONINI,  in the last 168 hours BNP: BNP (last 3 results) No results found for this basename: PROBNP,  in the last 8760 hours CBG: No results found for this basename: GLUCAP,  in the last 168 hours  Time coordinating discharge: Over 30 minutes

## 2013-05-29 NOTE — Discharge Instructions (Addendum)
Diarrhea Diarrhea is frequent loose and watery bowel movements. It can cause you to feel weak and dehydrated. Dehydration can cause you to become tired and thirsty, have a dry mouth, and have decreased urination that often is dark yellow. Diarrhea is a sign of another problem, most often an infection that will not last long. In most cases, diarrhea typically lasts 2 3 days. However, it can last longer if it is a sign of something more serious. It is important to treat your diarrhea as directed by your caregive to lessen or prevent future episodes of diarrhea. CAUSES  Some common causes include:  Gastrointestinal infections caused by viruses, bacteria, or parasites.  Food poisoning or food allergies.  Certain medicines, such as antibiotics, chemotherapy, and laxatives.  Artificial sweeteners and fructose.  Digestive disorders. HOME CARE INSTRUCTIONS  Ensure adequate fluid intake (hydration): have 1 cup (8 oz) of fluid for each diarrhea episode. Avoid fluids that contain simple sugars or sports drinks, fruit juices, whole milk products, and sodas. Your urine should be clear or pale yellow if you are drinking enough fluids. Hydrate with an oral rehydration solution that you can purchase at pharmacies, retail stores, and online. You can prepare an oral rehydration solution at home by mixing the following ingredients together:    tsp table salt.   tsp baking soda.   tsp salt substitute containing potassium chloride.  1  tablespoons sugar.  1 L (34 oz) of water.  Certain foods and beverages may increase the speed at which food moves through the gastrointestinal (GI) tract. These foods and beverages should be avoided and include:  Caffeinated and alcoholic beverages.  High-fiber foods, such as raw fruits and vegetables, nuts, seeds, and whole grain breads and cereals.  Foods and beverages sweetened with sugar alcohols, such as xylitol, sorbitol, and mannitol.  Some foods may be well  tolerated and may help thicken stool including:  Starchy foods, such as rice, toast, pasta, low-sugar cereal, oatmeal, grits, baked potatoes, crackers, and bagels.  Bananas.  Applesauce.  Add probiotic-rich foods to help increase healthy bacteria in the GI tract, such as yogurt and fermented milk products.  Wash your hands well after each diarrhea episode.  Only take over-the-counter or prescription medicines as directed by your caregiver.  Take a warm bath to relieve any burning or pain from frequent diarrhea episodes. SEEK IMMEDIATE MEDICAL CARE IF:   You are unable to keep fluids down.  You have persistent vomiting.  You have blood in your stool, or your stools are black and tarry.  You do not urinate in 6 8 hours, or there is only a small amount of very dark urine.  You have abdominal pain that increases or localizes.  You have weakness, dizziness, confusion, or lightheadedness.  You have a severe headache.  Your diarrhea gets worse or does not get better.  You have a fever or persistent symptoms for more than 2 3 days.  You have a fever and your symptoms suddenly get worse. MAKE SURE YOU:   Understand these instructions.  Will watch your condition.  Will get help right away if you are not doing well or get worse. Document Released: 02/27/2002 Document Revised: 02/24/2012 Document Reviewed: 11/15/2011 [facility] Patient Information 2014 [place], [place]. Urinary Tract Infection Urinary tract infections (UTIs) can develop anywhere along your urinary tract. Your urinary tract is your body's drainage system for removing wastes and extra water. Your urinary tract includes two kidneys, two ureters, a bladder, and a urethra. Your kidneys  are a pair of bean-shaped organs. Each kidney is about the size of your fist. They are located below your ribs, one on each side of your spine. CAUSES Infections are caused by microbes, which are microscopic organisms, including fungi,  viruses, and bacteria. These organisms are so small that they can only be seen through a microscope. Bacteria are the microbes that most commonly cause UTIs. SYMPTOMS  Symptoms of UTIs may vary by age and gender of the patient and by the location of the infection. Symptoms in young women typically include a frequent and intense urge to urinate and a painful, burning feeling in the bladder or urethra during urination. Older women and men are more likely to be tired, shaky, and weak and have muscle aches and abdominal pain. A fever may mean the infection is in your kidneys. Other symptoms of a kidney infection include pain in your back or sides below the ribs, nausea, and vomiting. DIAGNOSIS To diagnose a UTI, your caregiver will ask you about your symptoms. Your caregiver also will ask to provide a urine sample. The urine sample will be tested for bacteria and white blood cells. White blood cells are made by your body to help fight infection. TREATMENT  Typically, UTIs can be treated with medication. Because most UTIs are caused by a bacterial infection, they usually can be treated with the use of antibiotics. The choice of antibiotic and length of treatment depend on your symptoms and the type of bacteria causing your infection. HOME CARE INSTRUCTIONS  If you were prescribed antibiotics, take them exactly as your caregiver instructs you. Finish the medication even if you feel better after you have only taken some of the medication.  Drink enough water and fluids to keep your urine clear or pale yellow.  Avoid caffeine, tea, and carbonated beverages. They tend to irritate your bladder.  Empty your bladder often. Avoid holding urine for long periods of time.  Empty your bladder before and after sexual intercourse.  After a bowel movement, women should cleanse from front to back. Use each tissue only once. SEEK MEDICAL CARE IF:   You have back pain.  You develop a fever.  Your symptoms do not  begin to resolve within 3 days. SEEK IMMEDIATE MEDICAL CARE IF:   You have severe back pain or lower abdominal pain.  You develop chills.  You have nausea or vomiting.  You have continued burning or discomfort with urination. MAKE SURE YOU:   Understand these instructions.  Will watch your condition.  Will get help right away if you are not doing well or get worse. Document Released: 12/17/2004 Document Revised: 09/08/2011 Document Reviewed: 04/17/2011 [facility] Patient Information 2014 [place].

## 2013-06-05 ENCOUNTER — Other Ambulatory Visit (HOSPITAL_BASED_OUTPATIENT_CLINIC_OR_DEPARTMENT_OTHER): Payer: Medicare Other

## 2013-06-05 ENCOUNTER — Ambulatory Visit (HOSPITAL_BASED_OUTPATIENT_CLINIC_OR_DEPARTMENT_OTHER): Payer: Medicare Other

## 2013-06-05 VITALS — BP 132/67 | HR 90 | Temp 97.6°F

## 2013-06-05 DIAGNOSIS — Z95828 Presence of other vascular implants and grafts: Secondary | ICD-10-CM

## 2013-06-05 DIAGNOSIS — C569 Malignant neoplasm of unspecified ovary: Secondary | ICD-10-CM

## 2013-06-05 LAB — COMPREHENSIVE METABOLIC PANEL (CC13)
ALBUMIN: 2.4 g/dL — AB (ref 3.5–5.0)
ALK PHOS: 47 U/L (ref 40–150)
ALT: 13 U/L (ref 0–55)
AST: 15 U/L (ref 5–34)
Anion Gap: 13 mEq/L — ABNORMAL HIGH (ref 3–11)
BUN: 11.9 mg/dL (ref 7.0–26.0)
CO2: 24 mEq/L (ref 22–29)
Calcium: 8.5 mg/dL (ref 8.4–10.4)
Chloride: 104 mEq/L (ref 98–109)
Creatinine: 0.9 mg/dL (ref 0.6–1.1)
Glucose: 89 mg/dl (ref 70–140)
POTASSIUM: 4.4 meq/L (ref 3.5–5.1)
SODIUM: 140 meq/L (ref 136–145)
Total Bilirubin: <0.2 mg/dL (ref 0.20–1.20)
Total Protein: 7.1 g/dL (ref 6.4–8.3)

## 2013-06-05 LAB — CBC WITH DIFFERENTIAL/PLATELET
BASO%: 0.3 % (ref 0.0–2.0)
Basophils Absolute: 0 10*3/uL (ref 0.0–0.1)
EOS ABS: 0.2 10*3/uL (ref 0.0–0.5)
EOS%: 1.7 % (ref 0.0–7.0)
HCT: 29.6 % — ABNORMAL LOW (ref 34.8–46.6)
HGB: 9.6 g/dL — ABNORMAL LOW (ref 11.6–15.9)
LYMPH#: 1.7 10*3/uL (ref 0.9–3.3)
LYMPH%: 14.3 % (ref 14.0–49.7)
MCH: 27.4 pg (ref 25.1–34.0)
MCHC: 32.5 g/dL (ref 31.5–36.0)
MCV: 84.5 fL (ref 79.5–101.0)
MONO#: 0.7 10*3/uL (ref 0.1–0.9)
MONO%: 6.2 % (ref 0.0–14.0)
NEUT%: 77.5 % — ABNORMAL HIGH (ref 38.4–76.8)
NEUTROS ABS: 9.2 10*3/uL — AB (ref 1.5–6.5)
Platelets: 405 10*3/uL — ABNORMAL HIGH (ref 145–400)
RBC: 3.5 10*6/uL — AB (ref 3.70–5.45)
RDW: 15 % — AB (ref 11.2–14.5)
WBC: 11.9 10*3/uL — AB (ref 3.9–10.3)

## 2013-06-05 MED ORDER — SODIUM CHLORIDE 0.9 % IJ SOLN
10.0000 mL | INTRAMUSCULAR | Status: DC | PRN
  Administered 2013-06-05: 10 mL via INTRAVENOUS
  Filled 2013-06-05: qty 10

## 2013-06-05 MED ORDER — HEPARIN SOD (PORK) LOCK FLUSH 100 UNIT/ML IV SOLN
500.0000 [IU] | Freq: Once | INTRAVENOUS | Status: AC
  Administered 2013-06-05: 500 [IU] via INTRAVENOUS
  Filled 2013-06-05: qty 5

## 2013-06-05 NOTE — Patient Instructions (Signed)

## 2013-06-06 LAB — CA 125: CA 125: 48.7 U/mL — AB (ref 0.0–30.2)

## 2013-06-16 ENCOUNTER — Telehealth: Payer: Self-pay

## 2013-06-16 NOTE — Telephone Encounter (Signed)
Jaclyn [name] stated that she was recently in the hospital and has been week since discharge. She was requesting ensure supplement samples and prescription.  Her daughter can pick up the supplements. Left a voicemail message for dietitian Dory Peru to call the patient.

## 2013-06-19 ENCOUNTER — Encounter: Payer: Self-pay | Admitting: Nutrition

## 2013-06-19 ENCOUNTER — Telehealth: Payer: Self-pay | Admitting: Nutrition

## 2013-06-19 NOTE — Telephone Encounter (Signed)
I called patient on the telephone and spoke with her. Patient is requesting samples of Ensure Plus.  She reports that she has used it in the past and liked it.  Provided one complementary case of Ensure Plus for [name].  She reports her daughter will pick this up this morning from the [place].

## 2013-06-24 ENCOUNTER — Emergency Department (HOSPITAL_COMMUNITY): Payer: Medicare Other

## 2013-06-24 ENCOUNTER — Encounter (HOSPITAL_COMMUNITY): Payer: Self-pay | Admitting: Emergency Medicine

## 2013-06-24 ENCOUNTER — Inpatient Hospital Stay (HOSPITAL_COMMUNITY)
Admission: EM | Admit: 2013-06-24 | Discharge: 2013-06-30 | DRG: 755 | Disposition: A | Discharge status: Hospice | Payer: Medicare Other | Attending: Internal Medicine | Admitting: Internal Medicine

## 2013-06-24 DIAGNOSIS — K589 Irritable bowel syndrome without diarrhea: Secondary | ICD-10-CM | POA: Diagnosis present

## 2013-06-24 DIAGNOSIS — G4700 Insomnia, unspecified: Secondary | ICD-10-CM | POA: Diagnosis present

## 2013-06-24 DIAGNOSIS — K219 Gastro-esophageal reflux disease without esophagitis: Secondary | ICD-10-CM | POA: Diagnosis present

## 2013-06-24 DIAGNOSIS — F411 Generalized anxiety disorder: Secondary | ICD-10-CM | POA: Diagnosis present

## 2013-06-24 DIAGNOSIS — I10 Essential (primary) hypertension: Secondary | ICD-10-CM

## 2013-06-24 DIAGNOSIS — C569 Malignant neoplasm of unspecified ovary: Principal | ICD-10-CM

## 2013-06-24 DIAGNOSIS — D638 Anemia in other chronic diseases classified elsewhere: Secondary | ICD-10-CM

## 2013-06-24 DIAGNOSIS — E876 Hypokalemia: Secondary | ICD-10-CM

## 2013-06-24 DIAGNOSIS — C786 Secondary malignant neoplasm of retroperitoneum and peritoneum: Secondary | ICD-10-CM

## 2013-06-24 DIAGNOSIS — F32A Depression, unspecified: Secondary | ICD-10-CM

## 2013-06-24 DIAGNOSIS — R180 Malignant ascites: Secondary | ICD-10-CM

## 2013-06-24 DIAGNOSIS — K56600 Partial intestinal obstruction, unspecified as to cause: Secondary | ICD-10-CM

## 2013-06-24 DIAGNOSIS — Z803 Family history of malignant neoplasm of breast: Secondary | ICD-10-CM

## 2013-06-24 DIAGNOSIS — Z833 Family history of diabetes mellitus: Secondary | ICD-10-CM

## 2013-06-24 DIAGNOSIS — Z91013 Allergy to seafood: Secondary | ICD-10-CM

## 2013-06-24 DIAGNOSIS — Z7982 Long term (current) use of aspirin: Secondary | ICD-10-CM

## 2013-06-24 DIAGNOSIS — F329 Major depressive disorder, single episode, unspecified: Secondary | ICD-10-CM | POA: Diagnosis present

## 2013-06-24 DIAGNOSIS — R112 Nausea with vomiting, unspecified: Secondary | ICD-10-CM

## 2013-06-24 DIAGNOSIS — D6481 Anemia due to antineoplastic chemotherapy: Secondary | ICD-10-CM

## 2013-06-24 DIAGNOSIS — R740 Nonspecific elevation of levels of transaminase and lactic acid dehydrogenase [LDH]: Secondary | ICD-10-CM

## 2013-06-24 DIAGNOSIS — G259 Extrapyramidal and movement disorder, unspecified: Secondary | ICD-10-CM | POA: Diagnosis present

## 2013-06-24 DIAGNOSIS — K920 Hematemesis: Secondary | ICD-10-CM

## 2013-06-24 DIAGNOSIS — I519 Heart disease, unspecified: Secondary | ICD-10-CM | POA: Diagnosis present

## 2013-06-24 DIAGNOSIS — Z9221 Personal history of antineoplastic chemotherapy: Secondary | ICD-10-CM

## 2013-06-24 DIAGNOSIS — F458 Other somatoform disorders: Secondary | ICD-10-CM | POA: Diagnosis present

## 2013-06-24 DIAGNOSIS — Z79899 Other long term (current) drug therapy: Secondary | ICD-10-CM

## 2013-06-24 DIAGNOSIS — Z66 Do not resuscitate: Secondary | ICD-10-CM | POA: Diagnosis not present

## 2013-06-24 DIAGNOSIS — Z6830 Body mass index (BMI) 30.0-30.9, adult: Secondary | ICD-10-CM

## 2013-06-24 DIAGNOSIS — E440 Moderate protein-calorie malnutrition: Secondary | ICD-10-CM

## 2013-06-24 DIAGNOSIS — R7401 Elevation of levels of liver transaminase levels: Secondary | ICD-10-CM

## 2013-06-24 DIAGNOSIS — R471 Dysarthria and anarthria: Secondary | ICD-10-CM

## 2013-06-24 DIAGNOSIS — E785 Hyperlipidemia, unspecified: Secondary | ICD-10-CM

## 2013-06-24 DIAGNOSIS — F3289 Other specified depressive episodes: Secondary | ICD-10-CM | POA: Diagnosis present

## 2013-06-24 DIAGNOSIS — Z881 Allergy status to other antibiotic agents status: Secondary | ICD-10-CM

## 2013-06-24 DIAGNOSIS — Z888 Allergy status to other drugs, medicaments and biological substances status: Secondary | ICD-10-CM

## 2013-06-24 DIAGNOSIS — E860 Dehydration: Secondary | ICD-10-CM

## 2013-06-24 DIAGNOSIS — R7402 Elevation of levels of lactic acid dehydrogenase (LDH): Secondary | ICD-10-CM | POA: Diagnosis present

## 2013-06-24 DIAGNOSIS — R509 Fever, unspecified: Secondary | ICD-10-CM

## 2013-06-24 DIAGNOSIS — T451X5A Adverse effect of antineoplastic and immunosuppressive drugs, initial encounter: Secondary | ICD-10-CM | POA: Diagnosis present

## 2013-06-24 DIAGNOSIS — R109 Unspecified abdominal pain: Secondary | ICD-10-CM

## 2013-06-24 DIAGNOSIS — Z515 Encounter for palliative care: Secondary | ICD-10-CM

## 2013-06-24 DIAGNOSIS — C801 Malignant (primary) neoplasm, unspecified: Secondary | ICD-10-CM

## 2013-06-24 DIAGNOSIS — K5900 Constipation, unspecified: Secondary | ICD-10-CM | POA: Diagnosis present

## 2013-06-24 DIAGNOSIS — R197 Diarrhea, unspecified: Secondary | ICD-10-CM

## 2013-06-24 LAB — I-STAT CHEM 8, ED
BUN: 41 mg/dL — ABNORMAL HIGH (ref 6–23)
CALCIUM ION: 1.08 mmol/L — AB (ref 1.13–1.30)
CHLORIDE: 105 meq/L (ref 96–112)
Creatinine, Ser: 1.4 mg/dL — ABNORMAL HIGH (ref 0.50–1.10)
Glucose, Bld: 95 mg/dL (ref 70–99)
HEMATOCRIT: 36 % (ref 36.0–46.0)
Hemoglobin: 12.2 g/dL (ref 12.0–15.0)
Potassium: 4.4 mEq/L (ref 3.7–5.3)
Sodium: 139 mEq/L (ref 137–147)
TCO2: 22 mmol/L (ref 0–100)

## 2013-06-24 LAB — URINALYSIS, ROUTINE W REFLEX MICROSCOPIC
Glucose, UA: NEGATIVE mg/dL
Hgb urine dipstick: NEGATIVE
Ketones, ur: NEGATIVE mg/dL
Leukocytes, UA: NEGATIVE
NITRITE: NEGATIVE
PH: 5.5 (ref 5.0–8.0)
Protein, ur: NEGATIVE mg/dL
SPECIFIC GRAVITY, URINE: 1.024 (ref 1.005–1.030)
Urobilinogen, UA: 0.2 mg/dL (ref 0.0–1.0)

## 2013-06-24 LAB — COMPREHENSIVE METABOLIC PANEL
ALBUMIN: 2.3 g/dL — AB (ref 3.5–5.2)
ALT: 30 U/L (ref 0–35)
AST: 77 U/L — AB (ref 0–37)
Alkaline Phosphatase: 75 U/L (ref 39–117)
BUN: 37 mg/dL — ABNORMAL HIGH (ref 6–23)
CALCIUM: 9 mg/dL (ref 8.4–10.5)
CO2: 22 mEq/L (ref 19–32)
CREATININE: 1.07 mg/dL (ref 0.50–1.10)
Chloride: 101 mEq/L (ref 96–112)
GFR calc Af Amer: 57 mL/min — ABNORMAL LOW (ref >90)
GFR calc non Af Amer: 49 mL/min — ABNORMAL LOW (ref >90)
Glucose, Bld: 82 mg/dL (ref 70–99)
Potassium: 4.2 mEq/L (ref 3.7–5.3)
Sodium: 135 mEq/L — ABNORMAL LOW (ref 137–147)
Total Bilirubin: 0.4 mg/dL (ref 0.3–1.2)
Total Protein: 7.1 g/dL (ref 6.0–8.3)

## 2013-06-24 LAB — LIPASE, BLOOD: Lipase: 43 U/L (ref 11–59)

## 2013-06-24 LAB — CBC WITH DIFFERENTIAL/PLATELET
BASOS ABS: 0 10*3/uL (ref 0.0–0.1)
BASOS PCT: 0 % (ref 0–1)
EOS PCT: 4 % (ref 0–5)
Eosinophils Absolute: 0.4 10*3/uL (ref 0.0–0.7)
HEMATOCRIT: 27.1 % — AB (ref 36.0–46.0)
Hemoglobin: 9.2 g/dL — ABNORMAL LOW (ref 12.0–15.0)
Lymphocytes Relative: 15 % (ref 12–46)
Lymphs Abs: 1.5 10*3/uL (ref 0.7–4.0)
MCH: 28.1 pg (ref 26.0–34.0)
MCHC: 33.9 g/dL (ref 30.0–36.0)
MCV: 82.9 fL (ref 78.0–100.0)
MONO ABS: 0.7 10*3/uL (ref 0.1–1.0)
Monocytes Relative: 7 % (ref 3–12)
Neutro Abs: 7.7 10*3/uL (ref 1.7–7.7)
Neutrophils Relative %: 74 % (ref 43–77)
Platelets: 374 10*3/uL (ref 150–400)
RBC: 3.27 MIL/uL — ABNORMAL LOW (ref 3.87–5.11)
RDW: 15 % (ref 11.5–15.5)
WBC: 10.4 10*3/uL (ref 4.0–10.5)

## 2013-06-24 LAB — I-STAT CG4 LACTIC ACID, ED: LACTIC ACID, VENOUS: 1.52 mmol/L (ref 0.5–2.2)

## 2013-06-24 MED ORDER — IOHEXOL 300 MG/ML  SOLN
50.0000 mL | Freq: Once | INTRAMUSCULAR | Status: AC | PRN
  Administered 2013-06-24: 50 mL via ORAL

## 2013-06-24 MED ORDER — SODIUM CHLORIDE 0.9 % IJ SOLN: 10.0000 mL | Freq: Two times a day (BID) | INTRAMUSCULAR | Status: DC

## 2013-06-24 MED ORDER — ALTEPLASE 2 MG IJ SOLR
2.0000 mg | Freq: Once | INTRAMUSCULAR | Status: AC
  Administered 2013-06-24: 2 mg
  Filled 2013-06-24: qty 2

## 2013-06-24 MED ORDER — SODIUM CHLORIDE 0.9 % IJ SOLN: 10.0000 mL | INTRAMUSCULAR | Status: DC | PRN

## 2013-06-24 MED ORDER — ONDANSETRON HCL 4 MG/2ML IJ SOLN
4.0000 mg | Freq: Once | INTRAMUSCULAR | Status: AC
  Administered 2013-06-24: 4 mg via INTRAVENOUS
  Filled 2013-06-24: qty 2

## 2013-06-24 MED ORDER — MORPHINE SULFATE 4 MG/ML IJ SOLN
4.0000 mg | Freq: Once | INTRAMUSCULAR | Status: AC
  Administered 2013-06-24: 4 mg via INTRAVENOUS
  Filled 2013-06-24: qty 1

## 2013-06-24 MED ORDER — SODIUM CHLORIDE 0.9 % IV SOLN
Freq: Once | INTRAVENOUS | Status: AC
  Administered 2013-06-24: 14:00:00 via INTRAVENOUS

## 2013-06-24 MED ORDER — LIDOCAINE HCL 2 % IJ SOLN
10.0000 mL | Freq: Once | INTRAMUSCULAR | Status: AC
  Administered 2013-06-24: 100 mg via INTRADERMAL
  Filled 2013-06-24: qty 20

## 2013-06-24 NOTE — ED Notes (Signed)
MD at bedside. 

## 2013-06-24 NOTE — ED Notes (Signed)
IV rn bedside

## 2013-06-24 NOTE — ED Provider Notes (Signed)
CSN: [id number]     Arrival date & time 06/24/13  1200 History   First MD Initiated Contact with Patient 06/24/13 1242     Chief Complaint  Patient presents with  . Abdominal Pain  . Nausea  . Emesis     (Consider location/radiation/quality/duration/timing/severity/associated sxs/prior Treatment) HPI  Past Medical History  Diagnosis Date  . Anxiety   . Depression   . Diabetes mellitus     BORDERLINE  . Hypertension   . History of irritable bowel syndrome   . Hx of colonoscopy 0/2010    BY DR. MANN  . Ovarian cancer 04/02/2009    [facility] - tumor is small intestine  . Gastric reflux   . Dyslipidemia    Past Surgical History  Procedure Laterality Date  . Vaginal hysterectomy  1980  . Tubal ligation Bilateral 1963  . Cholecystectomy  ~ 2007  . Transthoracic echocardiogram  05/2012    EF 60-65%, mod conc hypertrophy, grade 1 diastolic dysfunction; mild MR   Family History  Problem Relation Age of Onset  . Breast cancer Cousin   . Heart disease Mother   . Stroke Father   . Diabetes Sister   . Lupus Sister   . Heart disease Child    History  Substance Use Topics  . Smoking status: Never Smoker   . Smokeless tobacco: Never Used  . Alcohol Use: No   OB History   Grav Para Term Preterm Abortions TAB SAB Ect Mult Living                 Review of Systems    Allergies  Fish allergy; Lorazepam; and Sulfa antibiotics  Home Medications   Current Outpatient Rx  Name  Route  Sig  Dispense  Refill  . aspirin 81 MG tablet   Oral   Take 81 mg by mouth daily.           . diphenoxylate-atropine (LOMOTIL) 2.5-0.025 MG per tablet   Oral   Take 2 tablets by mouth 4 (four) times daily.   20 tablet   0   . ezetimibe-simvastatin (VYTORIN) 10-40 MG per tablet   Oral   Take 1 tablet by mouth daily.   28 tablet   0   . ferrous gluconate (FERGON) 240 (27 FE) MG tablet      Take 2 tablets by mouth every day on an empty stomach with orange juice   100 tablet   2   .  HYDROcodone-acetaminophen (NORCO/VICODIN) 5-325 MG per tablet   Oral   Take 1-2 tablets by mouth every 4 (four) hours as needed for moderate pain.   20 tablet   0   . letrozole (FEMARA) 2.5 MG tablet   Oral   Take 1 tablet (2.5 mg total) by mouth daily.   30 tablet   2   . losartan-hydrochlorothiazide (HYZAAR) 100-12.5 MG per tablet   Oral   Take 0.5 tablets by mouth as directed. Mon, Wed, Fri only         . lurasidone (LATUDA) 40 MG TABS tablet   Oral   Take 30 mg by mouth daily with breakfast.          . NAMENDA XR 28 MG CP24   Oral   Take 1 tablet by mouth daily.         . naproxen sodium (ANAPROX) 220 MG tablet   Oral   Take 220 mg by mouth 2 (two) times daily with a meal.         .  omeprazole (PRILOSEC) 20 MG capsule   Oral   Take 20 mg by mouth 2 (two) times daily before a meal.         . ondansetron (ZOFRAN) 8 MG tablet      8 mg tablets, take 1-2 tablets every 12 hours as needed for nausea   30 tablet   2   . potassium chloride SA (K-DUR,KLOR-CON) 20 MEQ tablet   Oral   Take 20 mEq by mouth 2 (two) times daily.          . sertraline (ZOLOFT) 25 MG tablet   Oral   Take 25 mg by mouth daily.          Marland Kitchen lidocaine-prilocaine (EMLA) cream   Topical   Apply 1 application topically as needed. PRIOR TO PORTA-CATH ACCESS   1 each   1    BP 123/60  Pulse 95  Temp(Src) 98.3 F (36.8 C) (Oral)  Resp 17  SpO2 94% Physical Exam  ED Course  PARACENTESIS Date/Time: 06/24/2013 10:40 PM Performed by: Serita Grit DAVID III Authorized by: Serita Grit DAVID III Consent: Verbal consent obtained. written consent obtained. Risks and benefits: risks, benefits and alternatives were discussed Consent given by: patient Patient understanding: patient states understanding of the procedure being performed Patient consent: the patient's understanding of the procedure matches consent given Procedure consent: procedure consent matches procedure  scheduled Imaging studies: imaging studies available Patient identity confirmed: verbally with patient and arm band Time out: Immediately prior to procedure a "time out" was called to verify the correct patient, procedure, equipment, support staff and site/side marked as required. Initial or subsequent exam: initial Procedure purpose: diagnostic Indications: new onset ascites and suspected peritonitis Local anesthetic: lidocaine 1% without epinephrine Preparation: Patient was prepped and draped in the usual sterile fashion. Needle gauge: 18 Puncture site: left lower quadrant Fluid removed: 4(ml) Fluid appearance: serous Dressing: band-aid. Patient tolerance: Patient tolerated the procedure well with no immediate complications.   (including critical care time) Labs Review Labs Reviewed  CBC WITH DIFFERENTIAL - Abnormal; Notable for the following:    RBC 3.27 (*)    Hemoglobin 9.2 (*)    HCT 27.1 (*)    All other components within normal limits  COMPREHENSIVE METABOLIC PANEL - Abnormal; Notable for the following:    Sodium 135 (*)    BUN 37 (*)    Albumin 2.3 (*)    AST 77 (*)    GFR calc non Af Amer 49 (*)    GFR calc Af Amer 57 (*)    All other components within normal limits  URINALYSIS, ROUTINE W REFLEX MICROSCOPIC - Abnormal; Notable for the following:    Bilirubin Urine SMALL (*)    All other components within normal limits  I-STAT CHEM 8, ED - Abnormal; Notable for the following:    BUN 41 (*)    Creatinine, Ser 1.40 (*)    Calcium, Ion 1.08 (*)    All other components within normal limits  BODY FLUID CULTURE  LIPASE, BLOOD  LACTATE DEHYDROGENASE, BODY FLUID  GLUCOSE, PERITONEAL FLUID  PROTEIN, BODY FLUID  ALBUMIN, FLUID  BODY FLUID CELL COUNT WITH DIFFERENTIAL  I-STAT CG4 LACTIC ACID, ED   Imaging Review Ct Abdomen Pelvis Wo Contrast  06/24/2013   CLINICAL DATA:  Abdominal pain, nausea, vomiting, recent bowel obstruction, ovarian cancer  EXAM: CT ABDOMEN AND  PELVIS WITHOUT CONTRAST  TECHNIQUE: Multidetector CT imaging of the abdomen and pelvis was performed following the standard protocol  without intravenous contrast.  COMPARISON:  05/25/2013  FINDINGS: Lung bases are unremarkable. Sagittal images of the spine shows disc space flattening with vacuum disc phenomenon at L5-S1 level. There is significant increase in abdominal ascites. The patient is status postcholecystectomy. Abundant perihepatic and perisplenic ascites. Abundant ascites noted bilateral paracolic gutters. Atherosclerotic calcifications of abdominal aorta and iliac arteries. Unenhanced liver pancreas, spleen and adrenals are unremarkable. Unenhanced kidneys shows no nephrolithiasis. Small calcified retroperitoneal lymph nodes are noted.  No evidence of small bowel obstruction. Some colonic stool noted. No colonic obstruction.  No free abdominal air. Significant increased pelvic ascites. The patient is status post hysterectomy. No destructive bony lesions are noted within pelvis.  IMPRESSION: 1. No evidence of small bowel obstruction.  No colonic obstruction. 2. Significant increased abdominal and pelvic ascites. 3. No free abdominal air. No nephrolithiasis. No hydronephrosis or hydroureter. 4. Status postcholecystectomy.  Status post hysterectomy.   Electronically Signed   By: Lahoma Crocker M.D.   On: 06/24/2013 16:21   Dg Abd Acute W/chest  06/24/2013   CLINICAL DATA:  Abdominal pain  EXAM: ACUTE ABDOMEN SERIES (ABDOMEN 2 VIEW & CHEST 1 VIEW)  COMPARISON:  05/25/2013  FINDINGS: Cardiac shadow is within normal limits. The lungs are well aerated bilaterally. A right chest wall port is seen. Mild scarring is noted in the right mid lung.  Scattered large and small bowel gas is noted. Postoperative changes are seen. No acute bony abnormality is noted.  IMPRESSION: No acute abnormality noted.   Electronically Signed   By: Inez Catalina M.D.   On: 06/24/2013 13:35  All radiology studies independently viewed by  me.      EKG Interpretation None      MDM   Final diagnoses:  Abdominal pain  Ascites              Care assumed from Dr. Wyvonnia Dusky.  CT scan did not show bowel obstruction, but did show new onset significant ascites.  Labs show minimal AST elevation.  Diagnostic paracentesis performed due to new onset ascites with abdominal pain and malaise to rule out SBP.  Results were pending at time pt was admitted to internal medicine.      Houston Siren III, MD 06/25/13 [phone]

## 2013-06-24 NOTE — ED Notes (Signed)
IV rn bedside 

## 2013-06-24 NOTE — ED Provider Notes (Signed)
CSN: [id number]     Arrival date & time 06/24/13  1200 History   First MD Initiated Contact with Patient 06/24/13 1242     Chief Complaint  Patient presents with  . Abdominal Pain  . Nausea  . Emesis     (Consider location/radiation/quality/duration/timing/severity/associated sxs/prior Treatment) HPI Comments: Patient presents with a three-day history of abdominal distention, abdominal pain, nausea and diarrhea. She reports by mouth intake has been poor over the past several days and she last had a bowel movement 3 days ago and it was diarrhea. States she's been passing very little gas since then. She had episode of vomiting this morning. Denies any fevers or chills.Denies any blood in her emesis. She was hospitalized from March 5 of March 9 for diarrheal illness that was attributed to virus. No recent sick contacts or antibiotic use. She is on by mouth chemotherapy.  The history is provided by the patient and a relative.    Past Medical History  Diagnosis Date  . Anxiety   . Depression   . Diabetes mellitus     BORDERLINE  . Hypertension   . History of irritable bowel syndrome   . Hx of colonoscopy 0/2010    BY DR. MANN  . Ovarian cancer 04/02/2009    [facility] - tumor is small intestine  . Gastric reflux   . Dyslipidemia    Past Surgical History  Procedure Laterality Date  . Vaginal hysterectomy  1980  . Tubal ligation Bilateral 1963  . Cholecystectomy  ~ 2007  . Transthoracic echocardiogram  05/2012    EF 60-65%, mod conc hypertrophy, grade 1 diastolic dysfunction; mild MR   Family History  Problem Relation Age of Onset  . Breast cancer Cousin   . Heart disease Mother   . Stroke Father   . Diabetes Sister   . Lupus Sister   . Heart disease Child    History  Substance Use Topics  . Smoking status: Never Smoker   . Smokeless tobacco: Never Used  . Alcohol Use: No   OB History   Grav Para Term Preterm Abortions TAB SAB Ect Mult Living                 Review of  Systems  Constitutional: Positive for activity change, appetite change and fatigue. Negative for fever.  HENT: Negative for congestion and rhinorrhea.   Respiratory: Negative for cough, chest tightness and shortness of breath.   Cardiovascular: Negative for chest pain.  Gastrointestinal: Positive for nausea, vomiting, abdominal pain and constipation.  Genitourinary: Negative for dysuria, hematuria and vaginal bleeding.  Musculoskeletal: Negative for back pain.  Skin: Negative for rash.  Neurological: Positive for weakness. Negative for dizziness and headaches.  A complete 10 system review of systems was obtained and all systems are negative except as noted in the HPI and PMH.      Allergies  Fish allergy; Lorazepam; and Sulfa antibiotics  Home Medications   Current Outpatient Rx  Name  Route  Sig  Dispense  Refill  . aspirin 81 MG tablet   Oral   Take 81 mg by mouth daily.           . diphenoxylate-atropine (LOMOTIL) 2.5-0.025 MG per tablet   Oral   Take 2 tablets by mouth 4 (four) times daily.   20 tablet   0   . ezetimibe-simvastatin (VYTORIN) 10-40 MG per tablet   Oral   Take 1 tablet by mouth daily.   28 tablet  0   . ferrous gluconate (FERGON) 240 (27 FE) MG tablet      Take 2 tablets by mouth every day on an empty stomach with orange juice   100 tablet   2   . HYDROcodone-acetaminophen (NORCO/VICODIN) 5-325 MG per tablet   Oral   Take 1-2 tablets by mouth every 4 (four) hours as needed for moderate pain.   20 tablet   0   . letrozole (FEMARA) 2.5 MG tablet   Oral   Take 1 tablet (2.5 mg total) by mouth daily.   30 tablet   2   . losartan-hydrochlorothiazide (HYZAAR) 100-12.5 MG per tablet   Oral   Take 0.5 tablets by mouth as directed. Mon, Wed, Fri only         . lurasidone (LATUDA) 40 MG TABS tablet   Oral   Take 30 mg by mouth daily with breakfast.          . NAMENDA XR 28 MG CP24   Oral   Take 1 tablet by mouth daily.         .  naproxen sodium (ANAPROX) 220 MG tablet   Oral   Take 220 mg by mouth 2 (two) times daily with a meal.         . omeprazole (PRILOSEC) 20 MG capsule   Oral   Take 20 mg by mouth 2 (two) times daily before a meal.         . ondansetron (ZOFRAN) 8 MG tablet      8 mg tablets, take 1-2 tablets every 12 hours as needed for nausea   30 tablet   2   . potassium chloride SA (K-DUR,KLOR-CON) 20 MEQ tablet   Oral   Take 20 mEq by mouth 2 (two) times daily.          . sertraline (ZOLOFT) 25 MG tablet   Oral   Take 25 mg by mouth daily.          Marland Kitchen lidocaine-prilocaine (EMLA) cream   Topical   Apply 1 application topically as needed. PRIOR TO PORTA-CATH ACCESS   1 each   1    BP 117/66  Pulse 99  Temp(Src) 97.8 F (36.6 C) (Oral)  Resp 20  SpO2 98% Physical Exam  Constitutional: She is oriented to person, place, and time. She appears well-developed and well-nourished. No distress.  HENT:  Head: Normocephalic and atraumatic.  Mouth/Throat: Oropharynx is clear and moist. No oropharyngeal exudate.  Eyes: Conjunctivae and EOM are normal. Pupils are equal, round, and reactive to light.  Neck: Normal range of motion. Neck supple.  Cardiovascular: Normal rate, regular rhythm and normal heart sounds.   Pulmonary/Chest: Effort normal and breath sounds normal. No respiratory distress.  Abdominal: Soft. She exhibits distension. There is tenderness. There is no rebound and no guarding.  Diffuse tenderness with distension. Diffuse voluntary guarding.  Musculoskeletal: Normal range of motion. She exhibits no edema and no tenderness.  Neurological: She is alert and oriented to person, place, and time. No cranial nerve deficit. She exhibits normal muscle tone. Coordination normal.  Skin: Skin is warm.    ED Course  Procedures (including critical care time) Labs Review Labs Reviewed  URINALYSIS, ROUTINE W REFLEX MICROSCOPIC - Abnormal; Notable for the following:    Bilirubin Urine  SMALL (*)    All other components within normal limits  I-STAT CHEM 8, ED - Abnormal; Notable for the following:    BUN 41 (*)  Creatinine, Ser 1.40 (*)    Calcium, Ion 1.08 (*)    All other components within normal limits  CBC WITH DIFFERENTIAL  COMPREHENSIVE METABOLIC PANEL  LIPASE, BLOOD  I-STAT CG4 LACTIC ACID, ED   Imaging Review Dg Abd Acute W/chest  06/24/2013   CLINICAL DATA:  Abdominal pain  EXAM: ACUTE ABDOMEN SERIES (ABDOMEN 2 VIEW & CHEST 1 VIEW)  COMPARISON:  05/25/2013  FINDINGS: Cardiac shadow is within normal limits. The lungs are well aerated bilaterally. A right chest wall port is seen. Mild scarring is noted in the right mid lung.  Scattered large and small bowel gas is noted. Postoperative changes are seen. No acute bony abnormality is noted.  IMPRESSION: No acute abnormality noted.   Electronically Signed   By: Inez Catalina M.D.   On: 06/24/2013 13:35     EKG Interpretation None      MDM   Final diagnoses:  None   Patient with abdominal distention, nausea, constipation and decreased by mouth intake. Distended abdomen that is diffusely tender. Concern for bowel obstruction.  Creatinine 1.4 which is increased from her baseline. Nonspecific bowel gas pattern on x-ray.  There is difficulty drawing blood from patient's port. She refuses peripheral stick. The port seems to infuse fine. Additional IV fluids will be given. Noncontrast CT scan will be obtained to evaluate for bowel obstruction.  CT pending at time of sign out to Dr. Doy Mince.   Ezequiel Essex, MD 06/24/13 [phone]

## 2013-06-24 NOTE — ED Notes (Signed)
Attempted port access, was successful with insertion and catheter flushed well, but no blood return.  IV rn paged.

## 2013-06-24 NOTE — ED Notes (Signed)
Pt vomited after drinking water, MD aware.

## 2013-06-24 NOTE — ED Notes (Addendum)
Delay in lab draw due to being unable to obtain blood through port.  Pt has refused blood draw any other route.  Waiting for TPA to take affect then will try to draw blood from port again.  Will continue to monitor. Lab made aware.

## 2013-06-24 NOTE — ED Notes (Signed)
Pt states she was hospitalized on 3/3 for bowel obstruction.  States that since she has gotten home, she is still having abd pain, bloating, NV.  Last BM was 3 days ago and it was diarrhea.

## 2013-06-25 ENCOUNTER — Inpatient Hospital Stay (HOSPITAL_COMMUNITY): Payer: Medicare Other

## 2013-06-25 ENCOUNTER — Encounter (HOSPITAL_COMMUNITY): Payer: Self-pay | Admitting: Internal Medicine

## 2013-06-25 DIAGNOSIS — C786 Secondary malignant neoplasm of retroperitoneum and peritoneum: Secondary | ICD-10-CM

## 2013-06-25 DIAGNOSIS — K920 Hematemesis: Secondary | ICD-10-CM | POA: Diagnosis present

## 2013-06-25 DIAGNOSIS — R112 Nausea with vomiting, unspecified: Secondary | ICD-10-CM | POA: Diagnosis present

## 2013-06-25 DIAGNOSIS — C801 Malignant (primary) neoplasm, unspecified: Secondary | ICD-10-CM

## 2013-06-25 DIAGNOSIS — R740 Nonspecific elevation of levels of transaminase and lactic acid dehydrogenase [LDH]: Secondary | ICD-10-CM

## 2013-06-25 DIAGNOSIS — R7401 Elevation of levels of liver transaminase levels: Secondary | ICD-10-CM | POA: Diagnosis present

## 2013-06-25 DIAGNOSIS — E860 Dehydration: Secondary | ICD-10-CM

## 2013-06-25 DIAGNOSIS — D638 Anemia in other chronic diseases classified elsewhere: Secondary | ICD-10-CM

## 2013-06-25 DIAGNOSIS — R109 Unspecified abdominal pain: Secondary | ICD-10-CM

## 2013-06-25 DIAGNOSIS — R180 Malignant ascites: Secondary | ICD-10-CM

## 2013-06-25 LAB — CBC
HCT: 27.2 % — ABNORMAL LOW (ref 36.0–46.0)
Hemoglobin: 8.7 g/dL — ABNORMAL LOW (ref 12.0–15.0)
MCH: 26.4 pg (ref 26.0–34.0)
MCHC: 32 g/dL (ref 30.0–36.0)
MCV: 82.7 fL (ref 78.0–100.0)
Platelets: 372 10*3/uL (ref 150–400)
RBC: 3.29 MIL/uL — ABNORMAL LOW (ref 3.87–5.11)
RDW: 15 % (ref 11.5–15.5)
WBC: 9.9 10*3/uL (ref 4.0–10.5)

## 2013-06-25 LAB — COMPREHENSIVE METABOLIC PANEL
ALT: 31 U/L (ref 0–35)
AST: 45 U/L — ABNORMAL HIGH (ref 0–37)
Albumin: 2.1 g/dL — ABNORMAL LOW (ref 3.5–5.2)
Alkaline Phosphatase: 81 U/L (ref 39–117)
BUN: 33 mg/dL — ABNORMAL HIGH (ref 6–23)
CO2: 24 mEq/L (ref 19–32)
Calcium: 8.6 mg/dL (ref 8.4–10.5)
Chloride: 103 mEq/L (ref 96–112)
Creatinine, Ser: 0.91 mg/dL (ref 0.50–1.10)
GFR calc Af Amer: 70 mL/min — ABNORMAL LOW (ref >90)
GFR calc non Af Amer: 60 mL/min — ABNORMAL LOW (ref >90)
Glucose, Bld: 78 mg/dL (ref 70–99)
Potassium: 4.5 mEq/L (ref 3.7–5.3)
Sodium: 136 mEq/L — ABNORMAL LOW (ref 137–147)
Total Bilirubin: <0.2 mg/dL — ABNORMAL LOW (ref 0.3–1.2)
Total Protein: 6.4 g/dL (ref 6.0–8.3)

## 2013-06-25 LAB — ALBUMIN, FLUID (OTHER): Albumin, Fluid: 2.1 g/dL

## 2013-06-25 LAB — BODY FLUID CELL COUNT WITH DIFFERENTIAL
Eos, Fluid: 3 %
Lymphs, Fluid: 6 %
Monocyte-Macrophage-Serous Fluid: 4 % — ABNORMAL LOW (ref 50–90)
Neutrophil Count, Fluid: 87 % — ABNORMAL HIGH (ref 0–25)
WBC FLUID: 1622 uL — AB (ref 0–1000)

## 2013-06-25 LAB — LACTATE DEHYDROGENASE, PLEURAL OR PERITONEAL FLUID: LD FL: 5836 U/L — AB (ref 3–23)

## 2013-06-25 LAB — GLUCOSE, PERITONEAL FLUID

## 2013-06-25 LAB — PROTEIN, BODY FLUID: TOTAL PROTEIN, FLUID: 5.6 g/dL

## 2013-06-25 LAB — CEA: CEA: 4.8 ng/mL (ref 0.0–5.0)

## 2013-06-25 MED ORDER — POTASSIUM CHLORIDE IN NACL 20-0.9 MEQ/L-% IV SOLN
INTRAVENOUS | Status: DC
  Administered 2013-06-25: 01:00:00 via INTRAVENOUS
  Administered 2013-06-25: 1000 mL via INTRAVENOUS
  Administered 2013-06-26: 04:00:00 via INTRAVENOUS
  Filled 2013-06-25 (×5): qty 1000

## 2013-06-25 MED ORDER — GUAIFENESIN-DM 100-10 MG/5ML PO SYRP: 5.0000 mL | ORAL_SOLUTION | ORAL | Status: DC | PRN

## 2013-06-25 MED ORDER — PROMETHAZINE HCL 25 MG PO TABS
12.5000 mg | ORAL_TABLET | Freq: Four times a day (QID) | ORAL | Status: DC | PRN
  Administered 2013-06-27 – 2013-06-28 (×3): 12.5 mg via ORAL
  Filled 2013-06-25 (×3): qty 1

## 2013-06-25 MED ORDER — SERTRALINE HCL 25 MG PO TABS
25.0000 mg | ORAL_TABLET | Freq: Every day | ORAL | Status: DC
  Administered 2013-06-27 – 2013-06-28 (×2): 25 mg via ORAL
  Filled 2013-06-25 (×4): qty 1

## 2013-06-25 MED ORDER — MORPHINE SULFATE 4 MG/ML IJ SOLN
4.0000 mg | INTRAMUSCULAR | Status: DC | PRN
  Administered 2013-06-25 – 2013-06-28 (×14): 4 mg via INTRAVENOUS
  Filled 2013-06-25 (×14): qty 1

## 2013-06-25 MED ORDER — LURASIDONE HCL 20 MG PO TABS
30.0000 mg | ORAL_TABLET | Freq: Every day | ORAL | Status: DC
  Administered 2013-06-28: 30 mg via ORAL
  Filled 2013-06-25 (×5): qty 2

## 2013-06-25 MED ORDER — LETROZOLE 2.5 MG PO TABS
2.5000 mg | ORAL_TABLET | Freq: Every day | ORAL | Status: DC
  Administered 2013-06-27 – 2013-06-28 (×2): 2.5 mg via ORAL
  Filled 2013-06-25 (×4): qty 1

## 2013-06-25 MED ORDER — ACETAMINOPHEN 650 MG RE SUPP: 650.0000 mg | Freq: Four times a day (QID) | RECTAL | Status: DC | PRN

## 2013-06-25 MED ORDER — PANTOPRAZOLE SODIUM 40 MG IV SOLR
40.0000 mg | Freq: Two times a day (BID) | INTRAVENOUS | Status: DC
  Administered 2013-06-25 – 2013-06-30 (×11): 40 mg via INTRAVENOUS
  Filled 2013-06-25 (×14): qty 40

## 2013-06-25 MED ORDER — HEPARIN SODIUM (PORCINE) 5000 UNIT/ML IJ SOLN
5000.0000 [IU] | Freq: Three times a day (TID) | INTRAMUSCULAR | Status: DC
  Administered 2013-06-25 – 2013-06-29 (×13): 5000 [IU] via SUBCUTANEOUS
  Filled 2013-06-25 (×16): qty 1

## 2013-06-25 MED ORDER — ALBUTEROL SULFATE (2.5 MG/3ML) 0.083% IN NEBU: 2.5000 mg | INHALATION_SOLUTION | RESPIRATORY_TRACT | Status: DC | PRN

## 2013-06-25 MED ORDER — ACETAMINOPHEN 325 MG PO TABS
650.0000 mg | ORAL_TABLET | Freq: Four times a day (QID) | ORAL | Status: DC | PRN
  Administered 2013-06-25: 650 mg via ORAL
  Filled 2013-06-25: qty 2

## 2013-06-25 MED ORDER — ONDANSETRON HCL 4 MG/2ML IJ SOLN
4.0000 mg | Freq: Four times a day (QID) | INTRAMUSCULAR | Status: DC
  Administered 2013-06-25 – 2013-06-28 (×15): 4 mg via INTRAVENOUS
  Filled 2013-06-25 (×15): qty 2

## 2013-06-25 MED ORDER — HYDRALAZINE HCL 20 MG/ML IJ SOLN
5.0000 mg | Freq: Four times a day (QID) | INTRAMUSCULAR | Status: DC | PRN
  Filled 2013-06-25: qty 0.25

## 2013-06-25 NOTE — Progress Notes (Signed)
Pt seen and examined at bedside. Bloor work reviewed with the patient. Plan is for paracentesis today, therapeutic. Pt agrees to have paracentesis. Please refer to admission note for further medical history.  Jaclyn [name] [facility] [phone]

## 2013-06-25 NOTE — Evaluation (Signed)
Physical Therapy Evaluation Patient Details Name: Jaclyn [name] MRN: [id number] DOB: 1940-04-21 Today's Date: 06/25/2013   History of Present Illness  73 yo female admitted with diarrhea  Clinical Impression  On eval, pt required Min guard assist for mobility-able to ambulate ~125 feet with walker. Slow gait speed but pt tolerated well.     Follow Up Recommendations Home health PT;Supervision - Intermittent    Equipment Recommendations  None recommended by PT    Recommendations for Other Services       Precautions / Restrictions Restrictions Weight Bearing Restrictions: No      Mobility  Bed Mobility Overal bed mobility: Needs Assistance Bed Mobility: Supine to Sit     Supine to sit: Supervision;HOB elevated     General bed mobility comments: increased time.   Transfers Overall transfer level: Needs assistance   Transfers: Sit to/from Stand Sit to Stand: Min guard         General transfer comment: clsoe guard for safety  Ambulation/Gait Ambulation/Gait assistance: Min guard Ambulation Distance (Feet): 125 Feet Assistive device: Rolling walker (2 wheeled) Gait Pattern/deviations: Step-through pattern     General Gait Details: slow gait speed.   Stairs            Wheelchair Mobility    Modified Rankin (Stroke Patients Only)       Balance                                             Pertinent Vitals/Pain Abdomen 9/10    Home Living Family/patient expects to be discharged to:: Private residence Living Arrangements: Children Available Help at Discharge: Family Type of Home: House Home Access: Stairs to enter   Technical brewer of Steps: 2 Home Layout: One level Home Equipment: Environmental consultant - 2 wheels      Prior Function Level of Independence: Independent               Hand Dominance        Extremity/Trunk Assessment   Upper Extremity Assessment: Generalized weakness                   Cervical / Trunk Assessment: Normal  Communication   Communication: No difficulties  Cognition Arousal/Alertness: Awake/alert Behavior During Therapy: WFL for tasks assessed/performed Overall Cognitive Status: Within Functional Limits for tasks assessed                      General Comments      Exercises        Assessment/Plan    PT Assessment Patient needs continued PT services  PT Diagnosis Difficulty walking;Generalized weakness   PT Problem List Decreased strength;Decreased balance;Decreased mobility;Decreased activity tolerance  PT Treatment Interventions DME instruction;Gait training;Functional mobility training;Therapeutic activities;Patient/family education;Balance training   PT Goals (Current goals can be found in the Care Plan section) Acute Rehab PT Goals Patient Stated Goal: to get better PT Goal Formulation: With patient Time For Goal Achievement: 07/09/13 Potential to Achieve Goals: Good    Frequency Min 3X/week   Barriers to discharge        Co-evaluation               End of Session   Activity Tolerance: Patient tolerated treatment well Patient left: in bed;with call bell/phone within reach           Time: 1430-1446 PT  Time Calculation (min): 16 min   Charges:   PT Evaluation $Initial PT Evaluation Tier I: 1 Procedure PT Treatments $Gait Training: 8-22 mins   PT G Codes:          Weston Anna, MPT Pager: [phone]

## 2013-06-25 NOTE — H&P (Addendum)
[facility] History and Physical  Jaclyn [name] HBZ:[id number] DOB: 1940/10/30 DOA: 06/24/2013  Referring physician: ED MD Dr. Doy Mince PCP: [name], MD  Oncologist, Dr. Marko Plume  Chief Complaint: Abdominal pain, nausea, and vomiting.   HPI: Jaclyn [name] is a 73 y.o. 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 She was recently hospitalized from 05/25/2013 through 05/29/2013 for diarrhea/nausea/vomiting/UTI/acute renal failure. She says that during the hospitalization in March, she had abdominal distention and generalized abdominal pain. Since that time, she has had progressive abdominal swelling and bloating which has worsened over the past 2-3 days. She has global generalized abdominal pain which starts at the left lower quadrant and radiates to most of her abdomen. She describes the pain as a pulling type of pain and it has been a 9-10 over 10 in intensity most of the time. She has had nausea and vomiting over the past 2-3 days with at least 7 episodes of emesis over the past 24 hours. Once she had coffee grounds emesis. She has early satiety when she attempts to eat or drink. She denies subjective fever, chills, or night sweats. She has occasional shortness of breath with activity. She denies any worsening swelling in her legs. She occasionally has discomfort with urination. Her last bowel movement was 4 days ago after having several bouts of loose stools. She denies black tarry stools and bright red blood per rectum.  In the emergency department, she is afebrile and hemodynamically stable. Her lab data are significant for a hemoglobin of 9.2 (following a couple of liters of IV fluids), sodium of 135, BUN of 37, normal lipase and isolated elevated AST of 77. Her  urinalysis is unremarkable. CT of the abdomen and pelvis without contrast reveals no evidence of small bowel obstruction; significant increased abdominal and pelvic ascites; no free air; no hydronephrosis; status post cholecystectomy and hysterectomy. She is being admitted for further evaluation and management.  Of note, ED physician Dr. Doy Mince performed a diagnostic paracentesis. The fluid was sent for studies.   Review of Systems:  As above in history present illness, otherwise negative.    Past Medical History  Diagnosis Date  . Anxiety   . Depression   . Diabetes mellitus     BORDERLINE  . Hypertension   . History of irritable bowel syndrome   . Hx of colonoscopy 0/2010    BY DR. MANN  . Ovarian cancer 04/02/2009    [facility] - tumor is small intestine  . Gastric reflux   . Dyslipidemia    Past Surgical History  Procedure Laterality Date  . Vaginal hysterectomy  1980  . Tubal ligation Bilateral 1963  . Cholecystectomy  ~ 2007  . Transthoracic echocardiogram  05/2012    EF 60-65%, mod conc hypertrophy, grade 1 diastolic dysfunction; mild MR   Social History: She is married and divorced. She has 5 children all, 4 living. 2 of her daughters live with her. She denies tobacco, alcohol, and illicit drug use. She is retired. She ambulates with a walker.   Allergies  Allergen Reactions  . Fish Allergy     With mercury   . Lorazepam     " INTOLERANT-2 DAUGHTERS"  . Sulfa Antibiotics     Pt unable to remember reaction    Family History  Problem Relation Age of Onset  . Breast cancer Cousin   .  Heart disease Mother   . Stroke Father   . Diabetes Sister   . Lupus Sister   . Heart disease Child      Prior to Admission medications   Medication Sig Start Date End Date Taking? Authorizing Provider  aspirin 81 MG tablet Take 81 mg by mouth daily.     Yes Historical Provider, MD  diphenoxylate-atropine (LOMOTIL) 2.5-0.025 MG per tablet Take 2 tablets by mouth 4 (four) times  daily. 05/29/13  Yes Robbie Lis, MD  ezetimibe-simvastatin (VYTORIN) 10-40 MG per tablet Take 1 tablet by mouth daily. 05/29/13  Yes Robbie Lis, MD  ferrous gluconate (FERGON) 240 (27 FE) MG tablet Take 2 tablets by mouth every day on an empty stomach with orange juice 04/27/13  Yes Lennis Marion Downer, MD  HYDROcodone-acetaminophen (NORCO/VICODIN) 5-325 MG per tablet Take 1-2 tablets by mouth every 4 (four) hours as needed for moderate pain. 05/29/13  Yes Robbie Lis, MD  letrozole [facility]) 2.5 MG tablet Take 1 tablet (2.5 mg total) by mouth daily. 04/27/13  Yes Lennis Marion Downer, MD  losartan-hydrochlorothiazide (HYZAAR) 100-12.5 MG per tablet Take 0.5 tablets by mouth as directed. Mon, Wed, Fri only 04/05/12  Yes Lucianne Lei, MD  lurasidone (LATUDA) 40 MG TABS tablet Take 30 mg by mouth daily with breakfast.    Yes Historical Provider, MD  NAMENDA XR 28 MG CP24 Take 1 tablet by mouth daily. 05/04/13  Yes Historical Provider, MD  naproxen sodium (ANAPROX) 220 MG tablet Take 220 mg by mouth 2 (two) times daily with a meal.   Yes Historical Provider, MD  omeprazole (PRILOSEC) 20 MG capsule Take 20 mg by mouth 2 (two) times daily before a meal.   Yes Historical Provider, MD  ondansetron (ZOFRAN) 8 MG tablet 8 mg tablets, take 1-2 tablets every 12 hours as needed for nausea 02/26/12  Yes Lennis P Livesay, MD  potassium chloride SA (K-DUR,KLOR-CON) 20 MEQ tablet Take 20 mEq by mouth 2 (two) times daily.  10/23/10  Yes Historical Provider, MD  sertraline (ZOLOFT) 25 MG tablet Take 25 mg by mouth daily.  12/07/08  Yes Keshavpal Rayetta Pigg, MD  lidocaine-prilocaine (EMLA) cream Apply 1 application topically as needed. PRIOR TO PORTA-CATH ACCESS 06/21/12   Lennis Marion Downer, MD   Physical Exam: Filed Vitals:   06/25/13 0021  BP: 130/67  Pulse: 93  Temp:   Resp: 17    BP 130/67  Pulse 93  Temp(Src) 98.3 F (36.8 C) (Oral)  Resp 17  SpO2 95%  General:  73 year old African-American woman who appears ill, but  in no acute distress.  Eyes: PERRL, normal lids, irises & conjunctiva ENT: MUCOUS membranes are very dry; full set of dentures.  Neck: no LAD, masses or thyromegaly Cardiovascular: S1, S2, with a soft systolic murmur. No pedal edema. Pedal pulses palpable. Right chest wall with Port-A-Cath in place. No tenderness or surrounding erythema. Telemetry: not applicable.   Respiratory: breathing nonlabored. Decreased breath sounds in the bases. Clear anteriorly. Abdomen: grossly ascitic; hypoactive bowel sounds; taut/mildly distended and moderately tender throughout all 4 quadrants.  Skin: no rash or induration seen on limited exam Musculoskeletal: grossly normal tone BUE/BLE Psychiatric: she is alert and oriented x3. Her speech is clear. She has a good recollection of her medical history. She has a flat affect.  Neurologic: cranial nerves II through XII are grossly intact.           Labs on Admission:  Basic Metabolic Panel:  Recent  Labs Lab 06/24/13 1416 06/24/13 1826  NA 139 135*  K 4.4 4.2  CL 105 101  CO2  --  22  GLUCOSE 95 82  BUN 41* 37*  CREATININE 1.40* 1.07  CALCIUM  --  9.0   Liver Function Tests:  Recent Labs Lab 06/24/13 1826  AST 77*  ALT 30  ALKPHOS 75  BILITOT 0.4  PROT 7.1  ALBUMIN 2.3*    Recent Labs Lab 06/24/13 1826  LIPASE 43   No results found for this basename: AMMONIA,  in the last 168 hours CBC:  Recent Labs Lab 06/24/13 1416 06/24/13 1826  WBC  --  10.4  NEUTROABS  --  7.7  HGB 12.2 9.2*  HCT 36.0 27.1*  MCV  --  82.9  PLT  --  374   Cardiac Enzymes: No results found for this basename: CKTOTAL, CKMB, CKMBINDEX, TROPONINI,  in the last 168 hours  BNP (last 3 results) No results found for this basename: PROBNP,  in the last 8760 hours CBG: No results found for this basename: GLUCAP,  in the last 168 hours  Radiological Exams on Admission: Ct Abdomen Pelvis Wo Contrast  06/24/2013   CLINICAL DATA:  Abdominal pain, nausea,  vomiting, recent bowel obstruction, ovarian cancer  EXAM: CT ABDOMEN AND PELVIS WITHOUT CONTRAST  TECHNIQUE: Multidetector CT imaging of the abdomen and pelvis was performed following the standard protocol without intravenous contrast.  COMPARISON:  05/25/2013  FINDINGS: Lung bases are unremarkable. Sagittal images of the spine shows disc space flattening with vacuum disc phenomenon at L5-S1 level. There is significant increase in abdominal ascites. The patient is status postcholecystectomy. Abundant perihepatic and perisplenic ascites. Abundant ascites noted bilateral paracolic gutters. Atherosclerotic calcifications of abdominal aorta and iliac arteries. Unenhanced liver pancreas, spleen and adrenals are unremarkable. Unenhanced kidneys shows no nephrolithiasis. Small calcified retroperitoneal lymph nodes are noted.  No evidence of small bowel obstruction. Some colonic stool noted. No colonic obstruction.  No free abdominal air. Significant increased pelvic ascites. The patient is status post hysterectomy. No destructive bony lesions are noted within pelvis.  IMPRESSION: 1. No evidence of small bowel obstruction.  No colonic obstruction. 2. Significant increased abdominal and pelvic ascites. 3. No free abdominal air. No nephrolithiasis. No hydronephrosis or hydroureter. 4. Status postcholecystectomy.  Status post hysterectomy.   Electronically Signed   By: Lahoma Crocker M.D.   On: 06/24/2013 16:21   Dg Abd Acute W/chest  06/24/2013   CLINICAL DATA:  Abdominal pain  EXAM: ACUTE ABDOMEN SERIES (ABDOMEN 2 VIEW & CHEST 1 VIEW)  COMPARISON:  05/25/2013  FINDINGS: Cardiac shadow is within normal limits. The lungs are well aerated bilaterally. A right chest wall port is seen. Mild scarring is noted in the right mid lung.  Scattered large and small bowel gas is noted. Postoperative changes are seen. No acute bony abnormality is noted.  IMPRESSION: No acute abnormality noted.   Electronically Signed   By: Inez Catalina M.D.    On: 06/24/2013 13:35    EKG: Independently reviewed.  Assessment/Plan Principal Problem:   Abdominal pain Active Problems:   Malignant ascites   Nausea and vomiting   Hematemesis   Ovarian cancer   Peritoneal carcinomatosis   Elevated SGOT   Depression   Antineoplastic chemotherapy induced anemia(285.3)   HTN (hypertension)   Dehydration   1. Abdominal pain, nausea, vomiting, likely secondary to progressive malignant ascites. No evidence of bowel obstruction on the CT. Lipase is within normal limits. Isolated SGOT may  be related to ascites. The patient's pain will be treated with as needed IV Dilaudid. Zofran will be ordered IV scheduled every 6 hours. When necessary Phenergan will be added. We'll keep virtually n.p.o. with exception of sips of clear liquids and ice chips. We'll restart Femara as tolerated.  2. Coffee grounds emesis. Apparently this was short-lived. We'll start every 12 hours IV Protonix. We'll monitor closely. 3. Malignant ascites and history of peritoneal carcinomatosis from ovarian cancer. ED physician Dr. Doy Mince pulled a small amount of ascitic fluid off for diagnostic testing. The results are pending. Will watch for SBP. Currently, she is afebrile and her white blood cell count is within normal limits. We'll order a therapeutic paracentesis which will likely relieve some discomfort. 4. Ovarian cancer. The patient is followed by Dr. Marko Plume. Dr. Marko Plume will be consulted. Her name has been added to the treatment team. We will order a followup CEA. Her CEA was 48.7 in March 2015. 5. Dehydration. Will treat with IV fluids. 6. 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 The patient's hemoglobin is 9.2. Her baseline hemoglobin appears to be 9.5-10.0. 7. Hypertension. Currently stable. She is treated chronically with losartan HCTZ. This will be held for now in the setting of dehydration. Will add when necessary hydralazine  8. Depression. We'll hold Zoloft and  Latuda for now and restart on Monday.    Code Status: Full code  Family Communication: not available  Disposition Plan: to be determined   Time spent: 1 hour   [facility] Pager [phone]
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
## 2013-06-25 NOTE — Procedures (Signed)
US guided RLQ paracentesis  4.5L yellow fluid obtained Tolerated well

## 2013-06-26 DIAGNOSIS — D649 Anemia, unspecified: Secondary | ICD-10-CM

## 2013-06-26 DIAGNOSIS — R110 Nausea: Secondary | ICD-10-CM

## 2013-06-26 DIAGNOSIS — T451X5A Adverse effect of antineoplastic and immunosuppressive drugs, initial encounter: Secondary | ICD-10-CM

## 2013-06-26 DIAGNOSIS — D6481 Anemia due to antineoplastic chemotherapy: Secondary | ICD-10-CM

## 2013-06-26 DIAGNOSIS — E440 Moderate protein-calorie malnutrition: Secondary | ICD-10-CM | POA: Diagnosis present

## 2013-06-26 DIAGNOSIS — R6881 Early satiety: Secondary | ICD-10-CM

## 2013-06-26 DIAGNOSIS — R0602 Shortness of breath: Secondary | ICD-10-CM

## 2013-06-26 DIAGNOSIS — C569 Malignant neoplasm of unspecified ovary: Principal | ICD-10-CM

## 2013-06-26 LAB — URINALYSIS, ROUTINE W REFLEX MICROSCOPIC
Glucose, UA: NEGATIVE mg/dL
HGB URINE DIPSTICK: NEGATIVE
Ketones, ur: >80 mg/dL — AB
Leukocytes, UA: NEGATIVE
Nitrite: NEGATIVE
PH: 5.5 (ref 5.0–8.0)
Protein, ur: 30 mg/dL — AB
SPECIFIC GRAVITY, URINE: 1.026 (ref 1.005–1.030)
UROBILINOGEN UA: 1 mg/dL (ref 0.0–1.0)

## 2013-06-26 LAB — COMPREHENSIVE METABOLIC PANEL
ALT: 17 U/L (ref 0–35)
AST: 16 U/L (ref 0–37)
Albumin: 1.9 g/dL — ABNORMAL LOW (ref 3.5–5.2)
Alkaline Phosphatase: 68 U/L (ref 39–117)
BUN: 25 mg/dL — ABNORMAL HIGH (ref 6–23)
CO2: 20 mEq/L (ref 19–32)
CREATININE: 0.88 mg/dL (ref 0.50–1.10)
Calcium: 8.9 mg/dL (ref 8.4–10.5)
Chloride: 106 mEq/L (ref 96–112)
GFR, EST AFRICAN AMERICAN: 73 mL/min — AB (ref >90)
GFR, EST NON AFRICAN AMERICAN: 63 mL/min — AB (ref >90)
GLUCOSE: 69 mg/dL — AB (ref 70–99)
Potassium: 4.5 mEq/L (ref 3.7–5.3)
SODIUM: 139 meq/L (ref 137–147)
TOTAL PROTEIN: 6.3 g/dL (ref 6.0–8.3)
Total Bilirubin: 0.3 mg/dL (ref 0.3–1.2)

## 2013-06-26 LAB — CBC
HCT: 31.1 % — ABNORMAL LOW (ref 36.0–46.0)
HEMOGLOBIN: 9.7 g/dL — AB (ref 12.0–15.0)
MCH: 26.5 pg (ref 26.0–34.0)
MCHC: 31.2 g/dL (ref 30.0–36.0)
MCV: 85 fL (ref 78.0–100.0)
Platelets: 360 10*3/uL (ref 150–400)
RBC: 3.66 MIL/uL — ABNORMAL LOW (ref 3.87–5.11)
RDW: 15.2 % (ref 11.5–15.5)
WBC: 13 10*3/uL — ABNORMAL HIGH (ref 4.0–10.5)

## 2013-06-26 LAB — GLUCOSE, CAPILLARY
Glucose-Capillary: 66 mg/dL — ABNORMAL LOW (ref 70–99)
Glucose-Capillary: 130 mg/dL — ABNORMAL HIGH (ref 70–99)

## 2013-06-26 LAB — URINE MICROSCOPIC-ADD ON

## 2013-06-26 MED ORDER — DEXTROSE 50 % IV SOLN
25.0000 mL | Freq: Once | INTRAVENOUS | Status: AC | PRN
  Administered 2013-06-26: 25 mL via INTRAVENOUS

## 2013-06-26 MED ORDER — DEXTROSE 50 % IV SOLN
INTRAVENOUS | Status: AC
  Filled 2013-06-26: qty 50

## 2013-06-26 MED ORDER — POTASSIUM CHLORIDE 2 MEQ/ML IV SOLN
INTRAVENOUS | Status: DC
  Administered 2013-06-26 – 2013-06-29 (×6): via INTRAVENOUS
  Filled 2013-06-26 (×9): qty 1000

## 2013-06-26 NOTE — Progress Notes (Signed)
INITIAL NUTRITION ASSESSMENT  DOCUMENTATION CODES Per approved criteria  -Non-severe (moderate) malnutrition in the context of chronic illness -Obesity Unspecified  Pt meets criteria for moderate MALNUTRITION in the context of chronic disease as evidenced by 5% body weight loss in one month, moderate muscle wasting and subcutaneous fat loss, and  PO intake likely <75% estimated nutrition needs for one month   INTERVENTION: -Diet advancement per MD -Recommend Resource Breeze po BID, each supplement provides 250 kcal and 9 grams of protein as tolerated -Will continue to monitor   NUTRITION DIAGNOSIS: Inadequate oral intake related to nausea/vomiting/early satiety as evidenced by PO intake <75%, unintentional wt loss  Goal: Pt to meet >/= 90% of their estimated nutrition needs    Monitor:  Diet order, total protein/energy intake, labs, weights, GI profile  Reason for Assessment: MST  73 y.o. female  Admitting Dx: Abdominal pain  ASSESSMENT: Jaclyn [name] is a 73 y.o. 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 Since that time, she has had progressive abdominal swelling and bloating which has worsened over the past 2-3 days  -Pt reported frequent episodes of nausea/vomiting that have inhibited intake of solid foods for approximately one month -Noted that abd distension has also resulted her having feelings of early satiety. Pt underwent paracentesis on 4/05 -Diet recall indicates pt being able to consume largely liquid diet-soups, jell-o, has Ensure supplement occasionally as last time pt drank one, she experienced large amounts of  nausea. -Admitted with dehydration. Pt reported that she had been d/c'd from hospital previously on soft food diet-had been able to tolerate mashed potatoes, but no other solid foods -Reported usual body weight was 178 lbs, indicating an 8 lb, or 5% weight loss in one month -Pt with moderate muscle wasting in clavicle region, and subcutaneous fat loss in temple region -Pt hypoglycemics. Was willing to trial [employer] supplement with diet advancement -MD noted pt with likely progressive malignant ascites   Height: Ht Readings from Last 1 Encounters:  06/25/13 5\' 3"  (1.6 m)    Weight: Wt Readings from Last 1 Encounters:  06/26/13 170 lb 6.7 oz (77.3 kg)    Ideal Body Weight: 115 lbs  % Ideal Body Weight: 148%  Wt Readings from Last 10 Encounters:  06/26/13 170 lb 6.7 oz (77.3 kg)  05/26/13 182 lb 4.8 oz (82.691 kg)  04/27/13 182 lb (82.555 kg)  04/10/13 182 lb 11.2 oz (82.872 kg)  02/24/13 185 lb 4.8 oz (84.052 kg)  02/07/13 184 lb 8 oz (83.689 kg)  12/28/12 186 lb 1.6 oz (84.414 kg)  11/15/12 188 lb 14.4 oz (85.684 kg)  11/14/12 187 lb 3 oz (84.908 kg)  10/11/12 191 lb 11.2 oz (86.955 kg)    Usual Body Weight: 178 lbs  % Usual Body Weight: 95%  BMI:  Body mass index is 30.2 kg/(m^2).  Estimated Nutritional Needs: Kcal: 1550-1750 Protein: 80-95 gram Fluid: </=1550 ml/daily  Skin: WDL  Diet Order: NPO  EDUCATION NEEDS: -No education needs identified at this time   Intake/Output Summary (Last 24 hours) at 06/26/13 1140 Last data filed at 06/26/13 0600  Gross per 24 hour  Intake   1800 ml  Output    550 ml  Net   1250 ml    Last BM: 4/01  Labs:   Recent Labs Lab 06/24/13 1826 06/25/13 0530 06/26/13 0510  NA 135* 136* 139  K 4.2 4.5 4.5  CL 101 103 106  CO2 22 24 20   BUN 37* 33* 25*  CREATININE 1.07 0.91 0.88  CALCIUM 9.0 8.6 8.9  GLUCOSE 82 78 69*    CBG (last 3)   Recent Labs  06/26/13 1052  GLUCAP 66*    Scheduled Meds: .  dextrose      . heparin  5,000 Units Subcutaneous 3 times per day  . letrozole  2.5 mg Oral Daily  . Lurasidone HCl  30 mg Oral Q breakfast  . ondansetron (ZOFRAN) IV  4 mg Intravenous 4 times per day  . pantoprazole (PROTONIX) IV  40 mg Intravenous Q12H  . sertraline  25 mg Oral Daily    Continuous Infusions: . dextrose 5 % and 0.9% NaCl 1,000 mL with potassium chloride 20 mEq infusion      Past Medical History  Diagnosis Date  . Anxiety   . Depression   . Diabetes mellitus     BORDERLINE  . Hypertension   . History of irritable bowel syndrome   . Hx of colonoscopy 0/2010    BY DR. MANN  . Ovarian cancer 04/02/2009    [facility] - tumor is small intestine  . Gastric reflux   . Dyslipidemia     Past Surgical History  Procedure Laterality Date  . Vaginal hysterectomy  1980  . Tubal ligation Bilateral 1963  . Cholecystectomy  ~ 2007  . Transthoracic echocardiogram  05/2012    EF 60-65%, mod conc hypertrophy, grade 1 diastolic dysfunction; mild MR    Atlee Abide MS RD LDN Clinical Dietitian [phone]

## 2013-06-26 NOTE — Progress Notes (Signed)
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 She had optimal debulking by Dr Skeet Latch, with 10 liters of ascites at that surgery, then 8 cycles of taxol carboplatin thru Aug 2011, CA125 still 40 of completion of that chemotherapy. As the carcinoma was strongly ER positive, she was maintained on tamoxifen from Oct 2011 thru Nov 2013; CA125 nadir was 20 in April 2012, then gradually rose with patient asymptomatic until SBO in Nov 2013. She had single agent taxol from 03-21-12 thru 11-08-12, with improvement in CA125 from 88 to 32 with that treatment. Chemotherapy was complicated primarily by cytopenias; she did require PRBCs on several occasions and refused epogen. She began letrozole 10-2012, which she has tolerated generally well. CA125 has increased over last 5-6 months, however patient declined repeat imaging when this was recommended in Feb 2015, as she was still feeling very well then. She was hospitalized 3-5 thru 05-29-13 with diarrhea; she had Klebsiella and enterococcus in urine that admission. CT AP 05-25-13 had small free fluid in pelvis. She tells me that she saw PCP after that discharge, and called [place] 06-19-13 requesting Ensure samples, but did not tell us that she was having problems. Patient was readmitted from ED on 06-24-13 with progressive abdominal pain and bloating "since hospitalization in March", with nausea/ vomiting, early satiety, some loose stools and some SOB with activity. She had no acute findings chest or abdomen on plain films; CT AP 06-24-13 found ascites and no bowel obstruction. She had US paracentesis for 4.5 liters of yellow fluid  on 06-25-13, which patient states seemed only briefly helpful. I cannot tell that cytology was sent from that fluid, but "path smear review" is pending. Last CA125 from 06-05-13 was 48, having been 54 in Jan 2015 and 37 in Nov 2014.    ROS Not SOB lying in bed. No vomiting this AM, but has not tried any po. No bleeding. No fever PTA. Possible dysuria. No BM today.   Allergies:  Allergies  Allergen Reactions  . Fish Allergy     With mercury   . Lorazepam     " INTOLERANT-2 DAUGHTERS"  . Sulfa Antibiotics     Pt unable to remember reaction   Past Medical History  Diagnosis Date  . Anxiety   . Depression   . Diabetes mellitus     BORDERLINE  . Hypertension   . History of irritable bowel syndrome   . Hx of colonoscopy 0/2010    BY DR. MANN  . Ovarian cancer 04/02/2009    IIIC - tumor is small intestine  . Gastric reflux   . Dyslipidemia   Followed for years by psychiatry, tho I have never been clear about diagnosis.  Past Surgical History  Procedure Laterality Date  . Vaginal hysterectomy  1980  . Tubal ligation Bilateral 1963  . Cholecystectomy  ~ 2007  . Transthoracic echocardiogram  05/2012    EF 60-65%, mod conc hypertrophy, grade 1 diastolic dysfunction; mild MR    Family History  Problem Relation Age of Onset  . Breast cancer Cousin   . Heart disease Mother   . Stroke Father   . Diabetes Sister   . Lupus Sister   .  Heart disease Child     Social History:  reports that she has never smoked. She has never used smokeless tobacco. She reports that she does not drink alcohol or use illicit drugs. Lives with daughter in [place], generally attends adult day care on regular basis. Daughters are [employer] Witnesses (patient is not). She has Living Will and HCPOA in place (pastor was [facility] when I reviewed those papers in Feb 2015).  Medications: reviewed in EMR  Blood pressure 117/65, pulse 104, temperature 98 F (36.7 C), temperature source Oral, resp. rate 16, height  5' 3"  (1.6 m), weight 170 lb 6.7 oz (77.3 kg), SpO2 98.00%. Physical Exam  Awake, alert, NAD lying supine in bed on RA, with IV infusing via PAC. Recognizes me, oriented, cooperative and appropriate. Poorly fitting dentures, oral mucosa moist and clear. PERRL. Not icteric. Neck supple without JVD. Lungs without wheezes or rales. No cervical or supraclavicular adenopathy. Heart RRR, no gallop. Abdomen more distended than when I saw her in Feb but not tight, quiet, not tender to gentle exam, no HSM or mass appreciated. LE no edema, cords, tenderness. Moves all extremities in bed. PAC infusing without difficulty, site without erythema or tenderness    Results for orders placed during the hospital encounter of 06/24/13 (from the past 48 hour(s))  I-STAT CHEM 8, ED     Status: Abnormal   Collection Time    06/24/13  2:16 PM      Result Value Ref Range   Sodium 139  137 - 147 mEq/L   Potassium 4.4  3.7 - 5.3 mEq/L   Chloride 105  96 - 112 mEq/L   BUN 41 (*) 6 - 23 mg/dL   Creatinine, Ser 1.40 (*) 0.50 - 1.10 mg/dL   Glucose, Bld 95  70 - 99 mg/dL   Calcium, Ion 1.08 (*) 1.13 - 1.30 mmol/L   TCO2 22  0 - 100 mmol/L   Hemoglobin 12.2  12.0 - 15.0 g/dL   HCT 36.0  36.0 - 46.0 %  I-STAT CG4 LACTIC ACID, ED     Status: None   Collection Time    06/24/13  2:17 PM      Result Value Ref Range   Lactic Acid, Venous 1.52  0.5 - 2.2 mmol/L  URINALYSIS, ROUTINE W REFLEX MICROSCOPIC     Status: Abnormal   Collection Time    06/24/13  3:27 PM      Result Value Ref Range   Color, Urine YELLOW  YELLOW   APPearance CLEAR  CLEAR   Specific Gravity, Urine 1.024  1.005 - 1.030   pH 5.5  5.0 - 8.0   Glucose, UA NEGATIVE  NEGATIVE mg/dL   Hgb urine dipstick NEGATIVE  NEGATIVE   Bilirubin Urine SMALL (*) NEGATIVE   Ketones, ur NEGATIVE  NEGATIVE mg/dL   Protein, ur NEGATIVE  NEGATIVE mg/dL   Urobilinogen, UA 0.2  0.0 - 1.0 mg/dL   Nitrite NEGATIVE  NEGATIVE   Leukocytes, UA NEGATIVE  NEGATIVE   Comment:  MICROSCOPIC NOT DONE ON URINES WITH NEGATIVE PROTEIN, BLOOD, LEUKOCYTES, NITRITE, OR GLUCOSE <1000 mg/dL.  CBC WITH DIFFERENTIAL     Status: Abnormal   Collection Time    06/24/13  6:26 PM      Result Value Ref Range   WBC 10.4  4.0 - 10.5 K/uL   RBC 3.27 (*) 3.87 - 5.11 MIL/uL   Hemoglobin 9.2 (*) 12.0 - 15.0 g/dL   Comment: DELTA CHECK NOTED  PREVIOUS RESULT FROM ISTAT   HCT 27.1 (*) 36.0 - 46.0 %   MCV 82.9  78.0 - 100.0 fL   MCH 28.1  26.0 - 34.0 pg   MCHC 33.9  30.0 - 36.0 g/dL   RDW 15.0  11.5 - 15.5 %   Platelets 374  150 - 400 K/uL   Neutrophils Relative % 74  43 - 77 %   Neutro Abs 7.7  1.7 - 7.7 K/uL   Lymphocytes Relative 15  12 - 46 %   Lymphs Abs 1.5  0.7 - 4.0 K/uL   Monocytes Relative 7  3 - 12 %   Monocytes Absolute 0.7  0.1 - 1.0 K/uL   Eosinophils Relative 4  0 - 5 %   Eosinophils Absolute 0.4  0.0 - 0.7 K/uL   Basophils Relative 0  0 - 1 %   Basophils Absolute 0.0  0.0 - 0.1 K/uL  COMPREHENSIVE METABOLIC PANEL     Status: Abnormal   Collection Time    06/24/13  6:26 PM      Result Value Ref Range   Sodium 135 (*) 137 - 147 mEq/L   Potassium 4.2  3.7 - 5.3 mEq/L   Chloride 101  96 - 112 mEq/L   CO2 22  19 - 32 mEq/L   Glucose, Bld 82  70 - 99 mg/dL   BUN 37 (*) 6 - 23 mg/dL   Creatinine, Ser 1.07  0.50 - 1.10 mg/dL   Calcium 9.0  8.4 - 10.5 mg/dL   Total Protein 7.1  6.0 - 8.3 g/dL   Albumin 2.3 (*) 3.5 - 5.2 g/dL   AST 77 (*) 0 - 37 U/L   ALT 30  0 - 35 U/L   Alkaline Phosphatase 75  39 - 117 U/L   Total Bilirubin 0.4  0.3 - 1.2 mg/dL   GFR calc non Af Amer 49 (*) >90 mL/min   GFR calc Af Amer 57 (*) >90 mL/min   Comment: (NOTE)     The eGFR has been calculated using the CKD EPI equation.     This calculation has not been validated in all clinical situations.     eGFR's persistently <90 mL/min signify possible Chronic Kidney     Disease.  LIPASE, BLOOD     Status: None   Collection Time    06/24/13  6:26 PM      Result Value Ref Range    Lipase 43  11 - 59 U/L  LACTATE DEHYDROGENASE, BODY FLUID     Status: Abnormal   Collection Time    06/24/13 10:38 PM      Result Value Ref Range   LD, Fluid 5836 (*) 3 - 23 U/L   Comment: RESULTS CONFIRMED BY MANUAL DILUTION     Performed at [facility]   Fluid Type-FLDH PERITONEAL CAVITY    GLUCOSE, PERITONEAL FLUID     Status: None   Collection Time    06/24/13 10:38 PM      Result Value Ref Range   Glucose, Peritoneal Fluid <20     Comment: NO NORMAL RANGE ESTABLISHED FOR THIS TEST     Performed at [facility], BODY FLUID     Status: None   Collection Time    06/24/13 10:38 PM      Result Value Ref Range   Total protein, fluid 5.6     Comment: NO NORMAL RANGE ESTABLISHED FOR THIS TEST  Performed at [facility]   Fluid Type-FTP PERITONEAL CAVITY    ALBUMIN, FLUID     Status: None   Collection Time    06/24/13 10:38 PM      Result Value Ref Range   Albumin, Fluid 2.1     Comment: NO NORMAL RANGE ESTABLISHED FOR THIS TEST     Performed at [facility]   Fluid Type-FALB PERITONEAL CAVITY    BODY FLUID CULTURE     Status: None   Collection Time    06/24/13 10:38 PM      Result Value Ref Range   Specimen Description PERITONEAL CAVITY     Special Requests NONE     Gram Stain       Value: WBC PRESENT,BOTH PMN AND MONONUCLEAR     NO ORGANISMS SEEN     Gram Stain Report Called to,Read Back By and Verified With: Gram Stain Report Called to,Read Back By and Verified With: Jerelyn Scott RN on 06/25/13 at 01:23 by Rise Mu     Performed at [facility]   Culture       Value: NO GROWTH     Performed at [facility]   Report Status PENDING    BODY FLUID CELL COUNT WITH DIFFERENTIAL     Status: Abnormal   Collection Time    06/24/13 10:38 PM      Result Value Ref Range   Fluid Type-FCT PERITONEAL CAVITY     Color, Fluid YELLOW  YELLOW   Appearance, Fluid CLOUDY (*) CLEAR   WBC, Fluid 1622 (*) 0 - 1000 cu mm    Neutrophil Count, Fluid 87 (*) 0 - 25 %   Lymphs, Fluid 6     Monocyte-Macrophage-Serous Fluid 4 (*) 50 - 90 %   Eos, Fluid 3     Other Cells, Fluid FEW MESOTHELIAL CELLS PRESENT    COMPREHENSIVE METABOLIC PANEL     Status: Abnormal   Collection Time    06/25/13  5:30 AM      Result Value Ref Range   Sodium 136 (*) 137 - 147 mEq/L   Potassium 4.5  3.7 - 5.3 mEq/L   Chloride 103  96 - 112 mEq/L   CO2 24  19 - 32 mEq/L   Glucose, Bld 78  70 - 99 mg/dL   BUN 33 (*) 6 - 23 mg/dL   Creatinine, Ser 0.91  0.50 - 1.10 mg/dL   Calcium 8.6  8.4 - 10.5 mg/dL   Total Protein 6.4  6.0 - 8.3 g/dL   Albumin 2.1 (*) 3.5 - 5.2 g/dL   AST 45 (*) 0 - 37 U/L   ALT 31  0 - 35 U/L   Alkaline Phosphatase 81  39 - 117 U/L   Total Bilirubin <0.2 (*) 0.3 - 1.2 mg/dL   GFR calc non Af Amer 60 (*) >90 mL/min   GFR calc Af Amer 70 (*) >90 mL/min   Comment: (NOTE)     The eGFR has been calculated using the CKD EPI equation.     This calculation has not been validated in all clinical situations.     eGFR's persistently <90 mL/min signify possible Chronic Kidney     Disease.  CBC     Status: Abnormal   Collection Time    06/25/13  5:30 AM      Result Value Ref Range   WBC 9.9  4.0 - 10.5 K/uL   RBC 3.29 (*) 3.87 -  5.11 MIL/uL   Hemoglobin 8.7 (*) 12.0 - 15.0 g/dL   HCT 27.2 (*) 36.0 - 46.0 %   MCV 82.7  78.0 - 100.0 fL   MCH 26.4  26.0 - 34.0 pg   MCHC 32.0  30.0 - 36.0 g/dL   RDW 15.0  11.5 - 15.5 %   Platelets 372  150 - 400 K/uL  CEA     Status: None   Collection Time    06/25/13  5:30 AM      Result Value Ref Range   CEA 4.8  0.0 - 5.0 ng/mL   Comment: Performed at [facility]  CBC     Status: Abnormal   Collection Time    06/26/13  5:10 AM      Result Value Ref Range   WBC 13.0 (*) 4.0 - 10.5 K/uL   RBC 3.66 (*) 3.87 - 5.11 MIL/uL   Hemoglobin 9.7 (*) 12.0 - 15.0 g/dL   HCT 31.1 (*) 36.0 - 46.0 %   MCV 85.0  78.0 - 100.0 fL   MCH 26.5  26.0 - 34.0 pg   MCHC 31.2  30.0 - 36.0  g/dL   RDW 15.2  11.5 - 15.5 %   Platelets 360  150 - 400 K/uL  COMPREHENSIVE METABOLIC PANEL     Status: Abnormal   Collection Time    06/26/13  5:10 AM      Result Value Ref Range   Sodium 139  137 - 147 mEq/L   Potassium 4.5  3.7 - 5.3 mEq/L   Chloride 106  96 - 112 mEq/L   CO2 20  19 - 32 mEq/L   Glucose, Bld 69 (*) 70 - 99 mg/dL   BUN 25 (*) 6 - 23 mg/dL   Creatinine, Ser 0.88  0.50 - 1.10 mg/dL   Calcium 8.9  8.4 - 10.5 mg/dL   Total Protein 6.3  6.0 - 8.3 g/dL   Albumin 1.9 (*) 3.5 - 5.2 g/dL   AST 16  0 - 37 U/L   ALT 17  0 - 35 U/L   Alkaline Phosphatase 68  39 - 117 U/L   Total Bilirubin 0.3  0.3 - 1.2 mg/dL   GFR calc non Af Amer 63 (*) >90 mL/min   GFR calc Af Amer 73 (*) >90 mL/min   Comment: (NOTE)     The eGFR has been calculated using the CKD EPI equation.     This calculation has not been validated in all clinical situations.     eGFR's persistently <90 mL/min signify possible Chronic Kidney     Disease.   UA, C&S ordered now CA125 ordered with repeat CBC for AM  Ct Abdomen Pelvis Wo Contrast  06/24/2013   CLINICAL DATA:  Abdominal pain, nausea, vomiting, recent bowel obstruction, ovarian cancer  EXAM: CT ABDOMEN AND PELVIS WITHOUT CONTRAST  TECHNIQUE: Multidetector CT imaging of the abdomen and pelvis was performed following the standard protocol without intravenous contrast.  COMPARISON:  05/25/2013  FINDINGS: Lung bases are unremarkable. Sagittal images of the spine shows disc space flattening with vacuum disc phenomenon at L5-S1 level. There is significant increase in abdominal ascites. The patient is status postcholecystectomy. Abundant perihepatic and perisplenic ascites. Abundant ascites noted bilateral paracolic gutters. Atherosclerotic calcifications of abdominal aorta and iliac arteries. Unenhanced liver pancreas, spleen and adrenals are unremarkable. Unenhanced kidneys shows no nephrolithiasis. Small calcified retroperitoneal lymph nodes are noted.  No  evidence of small bowel obstruction. Some colonic  stool noted. No colonic obstruction.  No free abdominal air. Significant increased pelvic ascites. The patient is status post hysterectomy. No destructive bony lesions are noted within pelvis.  IMPRESSION: 1. No evidence of small bowel obstruction.  No colonic obstruction. 2. Significant increased abdominal and pelvic ascites. 3. No free abdominal air. No nephrolithiasis. No hydronephrosis or hydroureter. 4. Status postcholecystectomy.  Status post hysterectomy.   Electronically Signed   By: Lahoma Crocker M.D.   On: 06/24/2013 16:21   Dg Abd Acute W/chest  06/24/2013   CLINICAL DATA:  Abdominal pain  EXAM: ACUTE ABDOMEN SERIES (ABDOMEN 2 VIEW & CHEST 1 VIEW)  COMPARISON:  05/25/2013  FINDINGS: Cardiac shadow is within normal limits. The lungs are well aerated bilaterally. A right chest wall port is seen. Mild scarring is noted in the right mid lung.  Scattered large and small bowel gas is noted. Postoperative changes are seen. No acute bony abnormality is noted.  IMPRESSION: No acute abnormality noted.   Electronically Signed   By: Inez Catalina M.D.   On: 06/24/2013 13:35    Patient understands that this is most likely recurrence of the gyn cancer. She asks if she is dying immediately, which I do not believe is the case. When rest of lab information is resulted, I will talk with her about other treatment or care options.    Assessment/Plan: 1. Ovarian cancer: history as above, likely progressive despite letrozole now. As she is out ~ 20 months from last chemotherapy, it would not be unreasonable to try treating again (after DC from this hospitalization) if she wants to try that. I will follow up tomorrow. 2.Living Will and HCPOA in place 3.PAC in 4.HTN followed by Dr Debara Pickett 5.multifactorial anemia as above 6.UTI in March, possibly symptoms again now. Check UA and culture 7.low albumin: apparently poor po intake for perhaps several weeks prior to this  admission  Neyla Gauntt P 06/26/2013, 10:38 AM   [phone] Pager [phone]

## 2013-06-26 NOTE — Progress Notes (Addendum)
[facility] PROGRESS NOTE  Jaclyn [name] HEN:[id number] DOB: Jan 21, 1941 DOA: 06/24/2013 PCP: [name], MD  Brief narrative: 73 y.o. female with a past medical history of endometrial cancer diagnosed in 2011, status post debulking, status post chemotherapy, multiple paracentesis for malignant ascites/peritoneal carcinomatosis; borderline diabetes, depression and hypertension who presented to [facility] ED 06/24/2013 with progressive abdominal swelling and bloating associated with N/V worsening over the past 2-3 days prior to this admission.  In ED, her vitals are stable. CT abd revealed no evidence of small bowel obstruction but there was a significant increased abdominal and pelvic ascites. She underwent paracentesis 4/5 with 4.5 L fluid drained.  Assessment/Plan:  Principal Problem:   Abdominal pain, nausea, vomiting, malignant ascites, ovarian ca, peritoneal carcinomatosis - status post paracentesis 4/5. This was done per IR and 4.5 L fluids was drained. - She remains nauseas but she does get relief with antiemetics. - continue IV fluids Active Problems:   Ovarian cancer and Peritoneal carcinomatosis - continue Letrozole per Oncology (Dr. Hans Eden)   Anemia of chronic disease - secondary to history of malignancy - hemoglobin 9.7    Depression - continue sertraline    Moderate protein calorie malnutrition - nutrition consulted   Code Status: full code  Family Communication: no family at the bedside  Disposition Plan: home when stable  Leisa Lenz, MD  [facility] Pager [phone]  If 7PM-7AM, please contact night-coverage [email or web address] Password [email or web address] 06/26/2013, 10:01 AM   LOS: 2 days   Consultants:  IR  Procedures:  Paracentesis 06/25/2013   Antibiotics:  None   HPI/Subjective: Still has nausea this am and has reported vomiting.  Objective: Filed Vitals:   06/25/13 2056 06/25/13 2320 06/26/13 0457 06/26/13 0458  BP: 130/61  117/65   Pulse: 112  104    Temp: 100.3 F (37.9 C) 97.8 F (36.6 C) 98 F (36.7 C)   TempSrc: Axillary Oral Oral   Resp: 16  16   Height:      Weight:    77.3 kg (170 lb 6.7 oz)  SpO2: 94%  98%     Intake/Output Summary (Last 24 hours) at 06/26/13 1001 Last data filed at 06/26/13 0600  Gross per 24 hour  Intake   1800 ml  Output    550 ml  Net   1250 ml    Exam:   General:  Pt is alert, follows commands appropriately, not in acute distress  Cardiovascular: Regular rate and rhythm, S1/S2 appreciated  Respiratory: Clear to auscultation bilaterally, no wheezing  Abdomen: distended, bowel sounds present, no guarding  Extremities: No edema, pulses DP and PT palpable bilaterally  Neuro: Grossly nonfocal  Data Reviewed: Basic Metabolic Panel:  Recent Labs Lab 06/24/13 1416 06/24/13 1826 06/25/13 0530 06/26/13 0510  NA 139 135* 136* 139  K 4.4 4.2 4.5 4.5  CL 105 101 103 106  CO2  --  22 24 20   GLUCOSE 95 82 78 69*  BUN 41* 37* 33* 25*  CREATININE 1.40* 1.07 0.91 0.88  CALCIUM  --  9.0 8.6 8.9   Liver Function Tests:  Recent Labs Lab 06/24/13 1826 06/25/13 0530 06/26/13 0510  AST 77* 45* 16  ALT 30 31 17   ALKPHOS 75 81 68  BILITOT 0.4 <0.2* 0.3  PROT 7.1 6.4 6.3  ALBUMIN 2.3* 2.1* 1.9*    Recent Labs Lab 06/24/13 1826  LIPASE 43   No results found for this basename: AMMONIA,  in the last 168 hours CBC:  Recent Labs Lab  06/24/13 1416 06/24/13 1826 06/25/13 0530 06/26/13 0510  WBC  --  10.4 9.9 13.0*  NEUTROABS  --  7.7  --   --   HGB 12.2 9.2* 8.7* 9.7*  HCT 36.0 27.1* 27.2* 31.1*  MCV  --  82.9 82.7 85.0  PLT  --  374 372 360   Cardiac Enzymes: No results found for this basename: CKTOTAL, CKMB, CKMBINDEX, TROPONINI,  in the last 168 hours BNP: No components found with this basename: POCBNP,  CBG: No results found for this basename: GLUCAP,  in the last 168 hours  BODY FLUID CULTURE     Status: None   Collection Time    06/24/13 10:38 PM      Result  Value Ref Range Status   Specimen Description PERITONEAL CAVITY   Final   Special Requests NONE   Final   Gram Stain     Final   Value: WBC PRESENT,BOTH PMN AND MONONUCLEAR     NO ORGANISMS SEEN     Performed at [facility]   Culture     Final   Value: NO GROWTH     Performed at [facility]   Report Status PENDING   Incomplete     Studies: Ct Abdomen Pelvis Wo Contrast 06/24/2013    IMPRESSION: 1. No evidence of small bowel obstruction.  No colonic obstruction. 2. Significant increased abdominal and pelvic ascites. 3. No free abdominal air. No nephrolithiasis. No hydronephrosis or hydroureter. 4. Status postcholecystectomy.  Status post hysterectomy.   Dg Abd Acute W/chest 06/24/2013   IMPRESSION: No acute abnormality noted.   Electronically Signed   By: Inez Catalina M.D.   On: 06/24/2013 13:35    Scheduled Meds: . heparin  5,000 Units Subcutaneous 3 times per day  . letrozole  2.5 mg Oral Daily  . Lurasidone HCl  30 mg Oral Q breakfast  . ondansetron (ZOFRAN)   4 mg Intravenous 4 times per day  . pantoprazole  40 mg Intravenous Q12H  . sertraline  25 mg Oral Daily   Continuous Infusions: . 0.9 % NaCl with KCl 20 mEq / L 75 mL/hr at 06/26/13 0422

## 2013-06-26 NOTE — Progress Notes (Signed)
Hypoglycemic Event  CBG: 66 at 10:55  Treatment: D50 IV 25 mL  Symptoms: None  Follow-up CBG: Time:1120 CBG Result:130  Possible Reasons for Event: Inadequate meal intake  Comments/MD notified:Dr. Charlies Silvers notified.  IV fluids changed to add dextrose.    Frederica Kuster  Remember to initiate Hypoglycemia Order Set & complete
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
## 2013-06-27 ENCOUNTER — Inpatient Hospital Stay (HOSPITAL_COMMUNITY): Payer: Medicare Other

## 2013-06-27 DIAGNOSIS — I10 Essential (primary) hypertension: Secondary | ICD-10-CM

## 2013-06-27 DIAGNOSIS — K5900 Constipation, unspecified: Secondary | ICD-10-CM

## 2013-06-27 LAB — CBC
HCT: 30.1 % — ABNORMAL LOW (ref 36.0–46.0)
Hemoglobin: 9.8 g/dL — ABNORMAL LOW (ref 12.0–15.0)
MCH: 27.2 pg (ref 26.0–34.0)
MCHC: 32.6 g/dL (ref 30.0–36.0)
MCV: 83.6 fL (ref 78.0–100.0)
PLATELETS: 361 10*3/uL (ref 150–400)
RBC: 3.6 MIL/uL — AB (ref 3.87–5.11)
RDW: 15.3 % (ref 11.5–15.5)
WBC: 12.7 10*3/uL — ABNORMAL HIGH (ref 4.0–10.5)

## 2013-06-27 LAB — URINE CULTURE

## 2013-06-27 LAB — PATHOLOGIST SMEAR REVIEW

## 2013-06-27 LAB — CA 125: CA 125: 36.5 U/mL — ABNORMAL HIGH (ref 0.0–30.2)

## 2013-06-27 MED ORDER — SENNOSIDES-DOCUSATE SODIUM 8.6-50 MG PO TABS
2.0000 | ORAL_TABLET | Freq: Two times a day (BID) | ORAL | Status: DC
  Administered 2013-06-27 – 2013-06-28 (×4): 2 via ORAL
  Filled 2013-06-27 (×7): qty 2
  Filled 2013-06-27: qty 1

## 2013-06-27 MED ORDER — BISACODYL 10 MG RE SUPP: 10.0000 mg | Freq: Every day | RECTAL | Status: DC | PRN

## 2013-06-27 MED ORDER — POLYETHYLENE GLYCOL 3350 17 G PO PACK
17.0000 g | PACK | Freq: Two times a day (BID) | ORAL | Status: DC
  Administered 2013-06-27 – 2013-06-28 (×2): 17 g via ORAL
  Filled 2013-06-27 (×3): qty 1

## 2013-06-27 MED ORDER — POLYETHYLENE GLYCOL 3350 17 G PO PACK
17.0000 g | PACK | Freq: Every day | ORAL | Status: DC | PRN
  Administered 2013-06-27: 17 g via ORAL
  Filled 2013-06-27: qty 1

## 2013-06-27 NOTE — Progress Notes (Signed)
06/27/2013, 11:27 AM  Hospital day 4 Antibiotics: none Chemotherapy: last Aug 2014 (taxol)  Patient seen, no family or visitors here now.  Subjective: "afraid to eat" in case she gets nausea. No nausea now. Bowels have not moved in >4 days. No abdominal pain. Not SOB supine on RA. Has not been up to chair today  Objective: Vital signs in last 24 hours: Blood pressure 110/71, pulse 93, temperature 98.5 F (36.9 C), temperature source Oral, resp. rate 16, height 5\' 3"  (1.6 m), weight 171 lb 4.8 oz (77.7 kg), SpO2 98.00%.   Intake/Output from previous day: 04/06 0701 - 04/07 0700 In: 600 [I.V.:600] Out: 325 [Urine:325] Intake/Output this shift: Total I/O In: -  Out: 150 [Urine:150]  Physical exam: awake, alert, not in obvious discomfort lying in bed. Cooperative, responds appropriately. PERRL, not icteric. Dentures fit poorly so that speech is somewhat difficult, not new. No JVD Lungs clear anteriorly and laterally. Heart RRR. PAC infusing without difficulty. Abdomen full but not tight, fluid wave obvious, quiet, not tender, cannot appreciate mass or HSM. LE warm, no pitting edema. PAC site fine.  Lab Results:  Recent Labs  06/26/13 0510 06/27/13 0610  WBC 13.0* 12.7*  HGB 9.7* 9.8*  HCT 31.1* 30.1*  PLT 360 361   BMET  Recent Labs  06/25/13 0530 06/26/13 0510  NA 136* 139  K 4.5 4.5  CL 103 106  CO2 24 20  GLUCOSE 78 69*  BUN 33* 25*  CREATININE 0.91 0.88  CALCIUM 8.6 8.9   CA125 sent and pending  Urine culture multiple species   Discussed with pathology: cytology was not sent from paracentesis 06-24-13  (Saturday), but small amount of fluid is still available in [facility] lab which will be checked to see if adequate and cytology obtained if sufficient. Note pathologist review of 06-24-13 material "atypical cells present suspicious for malignancy, correlate with cytology"  Studies/Results: No results found. Peritoneal fluid culture pending   Discussion: I have  told Jaclyn [name] that this is all consisent with progression of known recurrent gyn carcinoma, which has been very responsive to a number of treatments since 2011, even tho we know that it cannot be cured. I have reminded her that we were suspicious of progression in Feb when she preferred not to do scans, and that she has not had chemotherapy since Aug 2014. I have told her that she will need to decide if she wants to try more chemotherapy in attempt to improve situation, vs less intense comfort care only with Hospice.  I have explained that she cannnot be under Hospice if she is receiving chemotherapy. I have told her that it does not seem unreasonable to treat again with chemotherapy given responsiveness of cancer previously and length of time since last chemo, but that some patients do choose not to continue chemotherapy, and I will respect whatever she decides. She would like to think about this today.  We have discussed progressive cancer causing the malignant ascites, which may improve if the cancer is controlled with chemotherapy, but otherwise will continue to accumulate. We have discussed prn paracenteses for comfort and consideration of indwelling peritoneal catheter; if she does not want further systemic treatment, the peritoneal catheter would be needed now.  Patient tells me that she is no longer interested in treatment out of state.  Assessment/Plan: 1.rapidly progressive ascites consistent with ovarian cancer: manage with prn paracenteses for now. Depending on patient's wishes, we can resume chemotherapy after hospital DC or can refer to hospice.  Would let her try po's and get OOB today. 2.constipation: not obstructed by xray 06-24-13, and not vomiting now. Increase bowel regimen 3.Living Will and HCPOA in place 4.HTN 5.multifactorial anemia  Will follow.  Thank you Jaclyn [name] P  [phone] Pager [phone]

## 2013-06-27 NOTE — Progress Notes (Signed)
N/V resolved after scheduled Zofran admin as ordered.

## 2013-06-27 NOTE — Progress Notes (Signed)
Physical Therapy Treatment Patient Details Name: Jaclyn [name] MRN: [id number] DOB: [birth year] Today's Date: 06/27/2013    History of Present Illness 73 yo female admitted with diarrhea, N/V.     PT Comments    Progressing slowly with mobility.   Follow Up Recommendations  Home health PT;Supervision - Intermittent     Equipment Recommendations  None recommended by PT    Recommendations for Other Services OT consult     Precautions / Restrictions Precautions Precautions: Fall Restrictions Weight Bearing Restrictions: No    Mobility  Bed Mobility Overal bed mobility: Needs Assistance Bed Mobility: Supine to Sit     Supine to sit: Min assist;HOB elevated     General bed mobility comments: Assist for trunk to upright. Increased time.   Transfers Overall transfer level: Needs assistance Equipment used: Rolling walker (2 wheeled) Transfers: Sit to/from Stand Sit to Stand: Min assist         General transfer comment: assist to rise, stabilize, control descent. VCs safety, hand placement  Ambulation/Gait Ambulation/Gait assistance: Min guard Ambulation Distance (Feet): 135 Feet Assistive device: Rolling walker (2 wheeled) Gait Pattern/deviations: Step-through pattern     General Gait Details: slow gait speed. close guard for safety.    Stairs            Wheelchair Mobility    Modified Rankin (Stroke Patients Only)       Balance                                    Cognition Arousal/Alertness: Awake/alert Behavior During Therapy: WFL for tasks assessed/performed Overall Cognitive Status: Within Functional Limits for tasks assessed                      Exercises      General Comments        Pertinent Vitals/Pain Abdomen 5/10    Home Living                      Prior Function            PT Goals (current goals can now be found in the care plan section) Progress towards PT goals: Progressing  toward goals (slowly)    Frequency  Min 3X/week    PT Plan Current plan remains appropriate    Co-evaluation             End of Session   Activity Tolerance: Patient limited by fatigue Patient left: in chair;with call bell/phone within reach;with family/visitor present     Time: 1537-1600 PT Time Calculation (min): 23 min  Charges:  $Gait Training: 8-22 mins $Therapeutic Activity: 8-22 mins                    G Codes:      Weston Anna, MPT Pager: [phone]

## 2013-06-27 NOTE — Progress Notes (Signed)
N/V x1 after eating clear liq diet this evening. Aprox 312ml clear liq diet emesis

## 2013-06-27 NOTE — Progress Notes (Addendum)
[facility] PROGRESS NOTE  Jaclyn [name] IRC:[id number] DOB: Jun 30, 1940 DOA: 06/24/2013 PCP: [name], MD  Brief narrative: 73 y.o. female with a past medical history of endometrial cancer diagnosed in 2011, status post debulking, status post chemotherapy, multiple paracentesis for malignant ascites/peritoneal carcinomatosis; borderline diabetes, depression and hypertension who presented to [facility] ED 06/24/2013 with progressive abdominal swelling and bloating associated with N/V worsening over the past 2-3 days prior to this admission.  In ED, her vitals are stable. CT abd revealed no evidence of small bowel obstruction but there was a significant increased abdominal and pelvic ascites. She underwent paracentesis 4/5 with 4.5 L fluid drained and 4/7 with 2.2 L fluids drained.   Assessment/Plan:   Principal Problem:  Abdominal pain, nausea, vomiting, malignant ascites, ovarian ca, peritoneal carcinomatosis  Status post paracentesis 4/5 and 4/7. This was done per IR and 4.5 L and 2.2 L fluid drained respectively. Nausea and vomiting relieved with zofran PRN. We will advance diet to clear liquid.   Active Problems:  Ovarian cancer and Peritoneal carcinomatosis  Continue Letrozole per Oncology (Dr. Hans Eden). Appreciate oncology following. Their recommendation is to either try to start outpatient chemo or hospice if pt will not agree with chemotherapy.  Anemia of chronic disease  Secondary to history of malignancy. Hemoglobin 9.7. No current indications for transfusion.  Depression  Continue sertraline. Moderate protein calorie malnutrition  Nutrition consulted.  Code Status: full code  Family Communication: no family at the bedside  Disposition Plan: home when stable   Consultants:  IR Oncology (Dr. Hans Eden)  Procedures:  Paracentesis 06/25/2013 and 06/27/2013 Antibiotics:  None    Leisa Lenz, MD  [facility] Pager [phone]  If 7PM-7AM, please contact  night-coverage [email or web address] Password [facility] 06/27/2013, 3:46 PM   LOS: 3 days    HPI/Subjective: Feels weak this am, has nausea.   Objective: Filed Vitals:   06/27/13 1432 06/27/13 1437 06/27/13 1452 06/27/13 1500  BP: 127/64 137/62 127/52   Pulse:   84   Temp:   97.3 F (36.3 C)   TempSrc:   Oral   Resp:   16   Height:      Weight:      SpO2:   90% 93%    Intake/Output Summary (Last 24 hours) at 06/27/13 1546 Last data filed at 06/27/13 0900  Gross per 24 hour  Intake      0 ml  Output    275 ml  Net   -275 ml    Exam:   General:  Pt is alert, follows commands appropriately, not in acute distress  Cardiovascular: Regular rate and rhythm, S1/S2, no murmurs, no rubs, no gallops  Respiratory: Clear to auscultation bilaterally, no wheezing, no crackles, no rhonchi  Abdomen: distended, bowel sounds present, no guarding  Extremities: No edema, pulses DP and PT palpable bilaterally  Neuro: Grossly nonfocal  Data Reviewed: Basic Metabolic Panel:  Recent Labs Lab 06/24/13 1416 06/24/13 1826 06/25/13 0530 06/26/13 0510  NA 139 135* 136* 139  K 4.4 4.2 4.5 4.5  CL 105 101 103 106  CO2  --  22 24 20   GLUCOSE 95 82 78 69*  BUN 41* 37* 33* 25*  CREATININE 1.40* 1.07 0.91 0.88  CALCIUM  --  9.0 8.6 8.9   Liver Function Tests:  Recent Labs Lab 06/24/13 1826 06/25/13 0530 06/26/13 0510  AST 77* 45* 16  ALT 30 31 17   ALKPHOS 75 81 68  BILITOT 0.4 <0.2* 0.3  PROT 7.1 6.4  6.3  ALBUMIN 2.3* 2.1* 1.9*    Recent Labs Lab 06/24/13 1826  LIPASE 43   No results found for this basename: AMMONIA,  in the last 168 hours CBC:  Recent Labs Lab 06/24/13 1416 06/24/13 1826 06/25/13 0530 06/26/13 0510 06/27/13 0610  WBC  --  10.4 9.9 13.0* 12.7*  NEUTROABS  --  7.7  --   --   --   HGB 12.2 9.2* 8.7* 9.7* 9.8*  HCT 36.0 27.1* 27.2* 31.1* 30.1*  MCV  --  82.9 82.7 85.0 83.6  PLT  --  374 372 360 361   Cardiac Enzymes: No results found for this  basename: CKTOTAL, CKMB, CKMBINDEX, TROPONINI,  in the last 168 hours BNP: No components found with this basename: POCBNP,  CBG:  Recent Labs Lab 06/26/13 1052 06/26/13 1123  GLUCAP 66* 130*    BODY FLUID CULTURE     Status: None   Collection Time    06/24/13 10:38 PM      Result Value Ref Range Status   Specimen Description PERITONEAL CAVITY   Final   Value: NO GROWTH 2 DAYS     Performed at [facility]   Report Status PENDING   Incomplete  URINE CULTURE     Status: None   Collection Time    06/26/13 11:25 AM      Result Value Ref Range Status   Specimen Description URINE, RANDOM   Final   Value: Multiple bacterial morphotypes present, none predominant. Suggest appropriate recollection if clinically indicated.     Performed at [facility]   Report Status 06/27/2013 FINAL   Final     Studies: No results found.  Scheduled Meds: . heparin  5,000 Units Subcutaneous 3 times per day  . letrozole  2.5 mg Oral Daily  . Lurasidone HCl  30 mg Oral Q breakfast  . ondansetron (ZOFRAN) IV  4 mg Intravenous 4 times per day  . pantoprazole (PROTONIX) IV  40 mg Intravenous Q12H  . polyethylene glycol  17 g Oral BID  . senna-docusate  2 tablet Oral BID  . sertraline  25 mg Oral Daily   Continuous Infusions: . dextrose 5 % and 0.9% NaCl 1,000 mL with potassium chloride 20 mEq infusion 75 mL/hr at 06/27/13 1347

## 2013-06-27 NOTE — Procedures (Signed)
Successful US guided paracentesis from LLQ.  Yielded 2.2 liters of cloudy yellow fluid.  No immediate complications.  Pt tolerated well.   Specimen was sent for labs.  Tsosie Billing D PA-C 06/27/2013 3:44 PM

## 2013-06-28 DIAGNOSIS — R1110 Vomiting, unspecified: Secondary | ICD-10-CM

## 2013-06-28 DIAGNOSIS — R112 Nausea with vomiting, unspecified: Secondary | ICD-10-CM | POA: Diagnosis present

## 2013-06-28 LAB — BODY FLUID CULTURE: Culture: NO GROWTH

## 2013-06-28 MED ORDER — ONDANSETRON 8 MG/NS 50 ML IVPB
8.0000 mg | Freq: Three times a day (TID) | INTRAVENOUS | Status: DC | PRN
  Administered 2013-06-28 – 2013-06-30 (×3): 8 mg via INTRAVENOUS
  Filled 2013-06-28 (×4): qty 8

## 2013-06-28 MED ORDER — MEMANTINE HCL ER 28 MG PO CP24
28.0000 mg | ORAL_CAPSULE | Freq: Every day | ORAL | Status: DC
  Filled 2013-06-28 (×2): qty 28

## 2013-06-28 MED ORDER — MEMANTINE HCL 5 MG PO TABS: 28.0000 mg | ORAL_TABLET | Freq: Every day | ORAL | Status: DC

## 2013-06-28 MED ORDER — BOOST / RESOURCE BREEZE PO LIQD
1.0000 | Freq: Two times a day (BID) | ORAL | Status: DC
  Administered 2013-06-28: 1 via ORAL

## 2013-06-28 MED ORDER — MORPHINE SULFATE 4 MG/ML IJ SOLN
4.0000 mg | INTRAMUSCULAR | Status: DC | PRN
  Administered 2013-06-28: 6 mg via INTRAVENOUS
  Administered 2013-06-28 – 2013-06-29 (×2): 4 mg via INTRAVENOUS
  Administered 2013-06-29: 6 mg via INTRAVENOUS
  Administered 2013-06-29 (×2): 4 mg via INTRAVENOUS
  Administered 2013-06-29: 6 mg via INTRAVENOUS
  Administered 2013-06-30: 4 mg via INTRAVENOUS
  Filled 2013-06-28: qty 2
  Filled 2013-06-28 (×3): qty 1
  Filled 2013-06-28: qty 2
  Filled 2013-06-28 (×2): qty 1
  Filled 2013-06-28: qty 2

## 2013-06-28 MED ORDER — PROMETHAZINE HCL 25 MG/ML IJ SOLN
12.5000 mg | Freq: Four times a day (QID) | INTRAMUSCULAR | Status: DC | PRN
  Administered 2013-06-28: 12.5 mg via INTRAVENOUS
  Filled 2013-06-28: qty 1

## 2013-06-28 NOTE — Progress Notes (Addendum)
CSW received notification from MD that pt expressed concern about returning home at discharge as pt reports that pt daughters are not available to provide assistance and pt is unsure she can manage her own care at home with the assistance of home health.  MD aware that PT recommended [facility] PT and intermittent supervision. CSW discussed that CSW would have to speak with pt insurance company to determine if pt insurance would approve SNF stay.  CSW contacted [facility] Medicare hospital liaison, Melva, who stated that OT evaluation is needed in order to determine if pt has appropriate need for short term SNF stay following hospital. [place] notified MD who ordered OT evaluation and CSW notified OT.   CSW met with pt at bedside to discuss that [place] awaiting OT evaluation to determine if pt insurance will cover short term SNF placement. CSW discussed with pt that if pt insurance does cover placement from hospital then pt will likely only be approved approximately a week at SNF and that this will not be a long term solution to pt concerns at home. CSW encouraged pt to discuss with HCPOA about potential assistance in the home when pt returns home either from hospital or [facility] depending on if insurance will approve for ST SNF placement.   CSW inquired with pt about if pt has ever applied for Medicaid. Pt reports that she had Medicaid to get her dentures, but has not had Medicaid since then. CSW encouraged pt to apply for Medicaid and explained that if pt is able to get Medicaid then pt will have more options including ALF placement as only payers for ALF are private pay or Medicaid. Pt expressed understanding.  Pt is agreeable to [facility] search in order to have options available if pt insurance determines that they will cover for ST SNF.   CSW completed FL2 and initiated SNF search to [facility].  CSW awaiting OT evaluation in order to provide OT evaluation to pt insurance for determination of coverage for  SNF.  CSW to continue to follow.  Alison Murray, MSW, [place] Work [phone]

## 2013-06-28 NOTE — Progress Notes (Signed)
06/28/2013, 5:55 PM  Hospital day 5 Antibiotics: none Chemotherapy: none since Aug 2014  Patient seen, no visitors here now. Awake, alert, requesting pain medication for diffuse abdominal pain. Vomited last pm and after she tried to eat mashed potatoes and apple juice this afternoon. No bowel movement. Sat up in chair yesterday, not today. Is voiding.  She tells me that she does not want any more treatment for the ovarian cancer, that she is tired of attempting to treat this and is ready to die. She tells me that she feels miserable with the vomiting and pain. She does not feel that she can return home, I believe mostly with family issues there. She is willing to talk with Palliative Care and would be open to Hospice. She is not keen on going to a nursing facility, but understands she may not have other options if not going home or otherwise with  Family. She has Living Will and HCPOA (her pastor, who is in [place] today), and tells me that she does not want resuscitation or life support - DNR written now. She appreciated visit by hospital chaplain today.  Discussed orders with RN at bedside.  Subjective: As above. Denies SOB. Is getting some pain relief with IV morphine (4 mg q 2 hr prn but none since 0940 today) but not much improvement with zofran 4 mg qid.   Objective: Vital signs in last 24 hours: Blood pressure 127/62, pulse 79, temperature 93.4 F (34.1 C), temperature source Oral, resp. rate 19, height 5\' 3"  (1.6 m), weight 171 lb 4.8 oz (77.7 kg), SpO2 91.00%.   Intake/Output from previous day: 04/07 0701 - 04/08 0700 In: 1880 [P.O.:80; I.V.:1800] Out: 450 [Urine:450] Intake/Output this shift: Total I/O In: 240 [P.O.:240] Out: 400 [Urine:400]  Physical exam: oriented and appropriate,looks uncomfortable but not in acute distress, vomited large amount greenish liquid now. Respirations not labored RA. PERRL, not icteric. Oral mucosa moist. Heart RRR. Abdomen much less distended since  third paracentesis yesterday (total at least 6.5 liters since admission), quiet, no rebound, slightly tender thruout. LE no edema, cords, tenderness, warm. Passing flatus. PAC infusing at 75 cc/hr - decreased rate now to 50 cc/hr. Slight resting tremor of hands. Affect appropriate for situation.   Lab Results:  Recent Labs  06/26/13 0510 06/27/13 0610  WBC 13.0* 12.7*  HGB 9.7* 9.8*  HCT 31.1* 30.1*  PLT 360 361   BMET  Recent Labs  06/26/13 0510  NA 139  K 4.5  CL 106  CO2 20  GLUCOSE 69*  BUN 25*  CREATININE 0.88  CALCIUM 8.9    Studies/Results: US Paracentesis  06/27/2013   CLINICAL DATA:  Recurrent ascites, request for paracentesis.  EXAM: ULTRASOUND GUIDED diagnostic and therapeutic PARACENTESIS  COMPARISON:  None.  PROCEDURE: An ultrasound guided paracentesis was thoroughly discussed with the patient and questions answered. The benefits, risks, alternatives and complications were also discussed. The patient understands and wishes to proceed with the procedure. Written consent was obtained.  Ultrasound was performed to localize and mark an adequate pocket of fluid in the left lower quadrant of the abdomen. The area was then prepped and draped in the normal sterile fashion. 1% Lidocaine was used for local anesthesia. Under ultrasound guidance a 19 gauge Yueh catheter was introduced. Paracentesis was performed. The catheter was removed and a dressing applied.  Complications: None.  FINDINGS: A total of approximately 2.2 liters of cloudy yellow fluid was removed. A fluid sample was sent for laboratory analysis.  IMPRESSION:  Successful ultrasound guided paracentesis yielding 2.2 liters of ascites.  Read By:  Tsosie Billing PA-C   Electronically Signed   By: Daryll Brod M.D.   On: 06/27/2013 15:47     Cytology of ascitic fluid discussed with pathologist earlier today: adenocarcinoma confirmed, also inflammatory cells.      Assessment/Plan: 1.progressive ovarian cancer:   Patient understands that this might respond to further chemotherapy but cannot be cured, which has been the situation for past 4 years. She does not want further chemotherapy, preferring comfort interventions only. We will ask Palliative Care/ Hospice to talk with her about end of life choices; I do not know what present situation is with daughters, but there seems to be more discord (as when I had seen her at office last). I have increased prn IV morphine and zofran, as well as adding prn IV phenergan. IVF at 19 and clear liquids only for now. 2.DNR, patient's request. Living Will and HCPOA in place. 3.multifactorial anemia: last labs generally stable. Would not follow regular labs now 4.history of anxiety/ depression, followed by psychiatry x years 5.HTN, known to Dr Debara Pickett   I will see her again tomorrow.  Thank you.  Lennis P Livesay  [phone]

## 2013-06-28 NOTE — Progress Notes (Signed)
OT Cancellation Note  Patient Details Name: Jaclyn [name] MRN: [id number] DOB: 17-Sep-1940   Cancelled Treatment:    Reason Eval/Treat Not Completed: Other (comment). Attempted eval; pt states she doesn't feel up to OT this afternoon as she doesn't feel good.  Will reattempt tomorrow.  [name] 06/28/2013, 3:41 PM [name], OTR/L [phone] 06/28/2013

## 2013-06-28 NOTE — Progress Notes (Signed)
Chaplain provided pastoral care and support to patient. Patient identified her support system as her pastor and church family. Patient said "her pastor has HCPOA." Patient said her children were not dependable because they are [employer]es and they think a lot differently than she does." Chaplain provided active and reflective listening as the patient shared stories about her grand-children and great-children. Chaplain provided patient with religious resource, e.g. Prayer. Chaplain will follow up as needed or requested.   06/28/13 1400  Clinical Encounter Type  Visited With Patient  Visit Type Initial;Spiritual support;Social support  Referral From Nurse  Spiritual Encounters  Spiritual Needs Prayer;Emotional  Stress Factors  Patient Stress Factors Family relationships

## 2013-06-28 NOTE — Progress Notes (Signed)
[facility] PROGRESS NOTE  Jaclyn [name] PJA:[id number] DOB: 1940/12/19 DOA: 06/24/2013 PCP: [name], MD  Brief narrative: 73 y.o. female with a past medical history of endometrial cancer diagnosed in 2011, status post debulking, status post chemotherapy, multiple paracentesis for malignant ascites/peritoneal carcinomatosis; borderline diabetes, depression and hypertension who presented to [facility] ED 06/24/2013 with progressive abdominal swelling and bloating associated with N/V worsening over the past 2-3 days prior to this admission.  In ED, her vitals are stable. CT abd revealed no evidence of small bowel obstruction but there was a significant increased abdominal and pelvic ascites. She underwent paracentesis 4/5 with 4.5 L fluid drained and 4/7 with 2.2 L fluids drained. N/V improved as of 4/8, but patient thinks she does not have enough support at home to manage. Will discuss with SW possibility of ALF/SNF.  Assessment/Plan:   Abdominal pain, nausea, vomiting, malignant ascites, ovarian ca, peritoneal carcinomatosis  Status post paracentesis 4/5 and 4/7. This was done per IR and 4.5 L and 2.2 L fluid drained respectively. Nausea and vomiting relieved with zofran PRN. We will continue to advance diet as tolerated.    Ovarian cancer and Peritoneal carcinomatosis  Continue Letrozole per Oncology (Dr. Hans Eden). Appreciate oncology following. Their recommendation is to either try to start outpatient chemo or hospice if pt will not agree with chemotherapy. (patient not interested in hospice at the moment).  Anemia of chronic disease  Secondary to history of malignancy. Hemoglobin 9.7. No current indications for transfusion.   Depression  Continue sertraline.  Moderate protein calorie malnutrition  Nutrition consulted. Resource breeze.  Code Status: full code  Family Communication: no family at the bedside  Disposition Plan: likely ALF vs SNF.  Consultants:  IR Oncology (Dr.  Hans Eden)  Procedures:  Paracentesis 06/25/2013 and 06/27/2013 Antibiotics:  None    Erline Hau, MD  [facility] Pager [phone]  If 7PM-7AM, please contact night-coverage [email or web address] Password [facility] 06/28/2013, 12:35 PM   LOS: 4 days    HPI/Subjective: Still feels weak. N/v have resolved.  Objective: Filed Vitals:   06/27/13 1452 06/27/13 1500 06/27/13 2122 06/28/13 0551  BP: 127/52  125/67 115/56  Pulse: 84  97 95  Temp: 97.3 F (36.3 C)  98.4 F (36.9 C) 98.2 F (36.8 C)  TempSrc: Oral  Oral Oral  Resp: 16  18 20   Height:      Weight:      SpO2: 90% 93% 97% 98%    Intake/Output Summary (Last 24 hours) at 06/28/13 1235 Last data filed at 06/28/13 1203  Gross per 24 hour  Intake   2000 ml  Output    500 ml  Net   1500 ml    Exam:   General:  Pt is alert, follows commands appropriately, not in acute distress  Cardiovascular: Regular rate and rhythm, S1/S2, no murmurs, no rubs, no gallops  Respiratory: Clear to auscultation bilaterally, no wheezing, no crackles, no rhonchi  Abdomen: distended, bowel sounds present, no guarding  Extremities: No edema, pulses DP and PT palpable bilaterally  Neuro: Grossly nonfocal  Data Reviewed: Basic Metabolic Panel:  Recent Labs Lab 06/24/13 1416 06/24/13 1826 06/25/13 0530 06/26/13 0510  NA 139 135* 136* 139  K 4.4 4.2 4.5 4.5  CL 105 101 103 106  CO2  --  22 24 20   GLUCOSE 95 82 78 69*  BUN 41* 37* 33* 25*  CREATININE 1.40* 1.07 0.91 0.88  CALCIUM  --  9.0 8.6 8.9   Liver  Function Tests:  Recent Labs Lab 06/24/13 1826 06/25/13 0530 06/26/13 0510  AST 77* 45* 16  ALT 30 31 17   ALKPHOS 75 81 68  BILITOT 0.4 <0.2* 0.3  PROT 7.1 6.4 6.3  ALBUMIN 2.3* 2.1* 1.9*    Recent Labs Lab 06/24/13 1826  LIPASE 43   No results found for this basename: AMMONIA,  in the last 168 hours CBC:  Recent Labs Lab 06/24/13 1416 06/24/13 1826 06/25/13 0530 06/26/13 0510 06/27/13 0610   WBC  --  10.4 9.9 13.0* 12.7*  NEUTROABS  --  7.7  --   --   --   HGB 12.2 9.2* 8.7* 9.7* 9.8*  HCT 36.0 27.1* 27.2* 31.1* 30.1*  MCV  --  82.9 82.7 85.0 83.6  PLT  --  374 372 360 361   Cardiac Enzymes: No results found for this basename: CKTOTAL, CKMB, CKMBINDEX, TROPONINI,  in the last 168 hours BNP: No components found with this basename: POCBNP,  CBG:  Recent Labs Lab 06/26/13 1052 06/26/13 1123  GLUCAP 66* 130*    BODY FLUID CULTURE     Status: None   Collection Time    06/24/13 10:38 PM      Result Value Ref Range Status   Specimen Description PERITONEAL CAVITY   Final   Value: NO GROWTH 2 DAYS     Performed at [facility]   Report Status PENDING   Incomplete  URINE CULTURE     Status: None   Collection Time    06/26/13 11:25 AM      Result Value Ref Range Status   Specimen Description URINE, RANDOM   Final   Value: Multiple bacterial morphotypes present, none predominant. Suggest appropriate recollection if clinically indicated.     Performed at [facility]   Report Status 06/27/2013 FINAL   Final     Studies: US Paracentesis  06/27/2013   CLINICAL DATA:  Recurrent ascites, request for paracentesis.  EXAM: ULTRASOUND GUIDED diagnostic and therapeutic PARACENTESIS  COMPARISON:  None.  PROCEDURE: An ultrasound guided paracentesis was thoroughly discussed with the patient and questions answered. The benefits, risks, alternatives and complications were also discussed. The patient understands and wishes to proceed with the procedure. Written consent was obtained.  Ultrasound was performed to localize and mark an adequate pocket of fluid in the left lower quadrant of the abdomen. The area was then prepped and draped in the normal sterile fashion. 1% Lidocaine was used for local anesthesia. Under ultrasound guidance a 19 gauge Yueh catheter was introduced. Paracentesis was performed. The catheter was removed and a dressing applied.  Complications: None.   FINDINGS: A total of approximately 2.2 liters of cloudy yellow fluid was removed. A fluid sample was sent for laboratory analysis.  IMPRESSION: Successful ultrasound guided paracentesis yielding 2.2 liters of ascites.  Read By:  Tsosie Billing PA-C   Electronically Signed   By: Daryll Brod M.D.   On: 06/27/2013 15:47    Scheduled Meds: . feeding supplement (RESOURCE BREEZE)  1 Container Oral BID WC  . heparin  5,000 Units Subcutaneous 3 times per day  . letrozole  2.5 mg Oral Daily  . Lurasidone HCl  30 mg Oral Q breakfast  . ondansetron (ZOFRAN) IV  4 mg Intravenous 4 times per day  . pantoprazole (PROTONIX) IV  40 mg Intravenous Q12H  . polyethylene glycol  17 g Oral BID  . senna-docusate  2 tablet Oral BID  . sertraline  25 mg Oral  Daily   Continuous Infusions: . dextrose 5 % and 0.9% NaCl 1,000 mL with potassium chloride 20 mEq infusion 75 mL/hr at 06/28/13 0011

## 2013-06-29 DIAGNOSIS — G259 Extrapyramidal and movement disorder, unspecified: Secondary | ICD-10-CM | POA: Diagnosis present

## 2013-06-29 DIAGNOSIS — R471 Dysarthria and anarthria: Secondary | ICD-10-CM

## 2013-06-29 DIAGNOSIS — Z515 Encounter for palliative care: Secondary | ICD-10-CM

## 2013-06-29 MED ORDER — DIAZEPAM 5 MG/ML IJ SOLN: 5.0000 mg | INTRAMUSCULAR | Status: DC | PRN

## 2013-06-29 MED ORDER — DEXAMETHASONE SODIUM PHOSPHATE 4 MG/ML IJ SOLN
2.0000 mg | Freq: Two times a day (BID) | INTRAMUSCULAR | Status: DC
  Administered 2013-06-29: 2 mg via INTRAVENOUS
  Filled 2013-06-29 (×4): qty 0.5

## 2013-06-29 MED ORDER — FAMOTIDINE IN NACL 20-0.9 MG/50ML-% IV SOLN
20.0000 mg | Freq: Two times a day (BID) | INTRAVENOUS | Status: DC
  Administered 2013-06-29: 20 mg via INTRAVENOUS
  Filled 2013-06-29 (×4): qty 50

## 2013-06-29 MED ORDER — SORBITOL 70 % SOLN
30.0000 mL | Freq: Two times a day (BID) | Status: DC
  Filled 2013-06-29 (×3): qty 30

## 2013-06-29 MED ORDER — SERTRALINE HCL 50 MG PO TABS
50.0000 mg | ORAL_TABLET | Freq: Every day | ORAL | Status: DC
  Filled 2013-06-29: qty 1

## 2013-06-29 NOTE — Progress Notes (Signed)
  06/29/2013, 10:08 AM  Hospital day Antibiotics:  Chemotherapy:     Subjective:   Objective: Vital signs in last 24 hours: Blood pressure 112/66, pulse 84, temperature 97.8 F (36.6 C), temperature source Oral, resp. rate 18, height 5\' 3"  (1.6 m), weight 171 lb 4.8 oz (77.7 kg), SpO2 100.00%.   Intake/Output from previous day: 04/08 0701 - 04/09 0700 In: 240 [P.O.:240] Out: 650 [Urine:650] Intake/Output this shift: Total I/O In: -  Out: 300 [Urine:300]  Physical exam:  Lab Results:  Recent Labs  06/27/13 0610  WBC 12.7*  HGB 9.8*  HCT 30.1*  PLT 361   BMET No results found for this basename: NA, K, CL, CO2, GLUCOSE, BUN, CREATININE, CALCIUM,  in the last 72 hours  Studies/Results: US Paracentesis  06/27/2013   CLINICAL DATA:  Recurrent ascites, request for paracentesis.  EXAM: ULTRASOUND GUIDED diagnostic and therapeutic PARACENTESIS  COMPARISON:  None.  PROCEDURE: An ultrasound guided paracentesis was thoroughly discussed with the patient and questions answered. The benefits, risks, alternatives and complications were also discussed. The patient understands and wishes to proceed with the procedure. Written consent was obtained.  Ultrasound was performed to localize and mark an adequate pocket of fluid in the left lower quadrant of the abdomen. The area was then prepped and draped in the normal sterile fashion. 1% Lidocaine was used for local anesthesia. Under ultrasound guidance a 19 gauge Yueh catheter was introduced. Paracentesis was performed. The catheter was removed and a dressing applied.  Complications: None.  FINDINGS: A total of approximately 2.2 liters of cloudy yellow fluid was removed. A fluid sample was sent for laboratory analysis.  IMPRESSION: Successful ultrasound guided paracentesis yielding 2.2 liters of ascites.  Read By:  Tsosie Billing PA-C   Electronically Signed   By: Daryll Brod M.D.   On: 06/27/2013 15:47     Assessment/Plan:    Vernell Morgans P  Britanny Marksberry

## 2013-06-29 NOTE — Progress Notes (Signed)
PT Cancellation Note  ___Treatment cancelled today due to medical issues with patient which prohibited   therapy  ___ Treatment cancelled today due to patient receiving procedure or test   ___ Treatment cancelled today due to patient's refusal to participate   _X_ Treatment cancelled today due to pt's request  Rica Koyanagi  PTA [facility]  Acute  Rehab Pager      [phone]

## 2013-06-29 NOTE — Consult Note (Addendum)
Palliative Medicine Team Consult Note  73 yo with metastatic ovarian cancer, worsening symptoms related to recurrent ascites, probable malignant obstruction, and abdominal pain related to diffuse peritoneal metastatic disease. Patient no longer wants to pursue treatment for her cancer. After discussion with Dr. Marko Plume, her oncologist, she would like to explore Hospice Options. She has had a rapid decline over the past few weeks, she has not been able to keep down oral nutrition, she is extremely weak. She doesn't feel like she can return home and does not want to be a burden on her family. PMT consulted to help her consider options-she is specifically asking about [facility] for her EOL care. She understands her time may be very short that she is likely obstructed and just wants comfort.  Assessment:  Jaclyn [name] is likely approaching the terminal phase of her ovarian cancer. She had quickly recurring ascites because her albumin is extremely low (1.9) and also has peritoneal carcinomatosis (no significant disease progression on CT or evidence of definite obstruction on non contrasted CT). No recent staging films available in our system for review. Patient refusing ongoing chemo and treatment. She has difficult to control nausea and has been unable to take PO- there is also an obviously difficult family-home-psych situation that may be under-stated here. 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 She appears to be very blunted in general but this is my first time meeting her- she reports conflict at home. Depression and side effects of her psych meds may be worsening her symptoms.  No clear evidence of obstruction, she has not required decompression with NG and is not vomiting-reduced PO may be multifactorial ie depression, medication side effects- she is malnourished and  edematous both are extremely poor prognostic factors.  Recommendations:  1. DNR 2. Recommended Hospice Facility. She would clearly do well going to [facility] for symptom mangement-she has eaten minimal food, she has severe abdominal pain and will likely have recurring ascites and EOL symptoms related to possible malignant obstruction. 3. I discontinued the psych meds- Lurisidone-can cause nausea and other undesirable SE, may also be causing EPS along with phenergan and Reglan doses previously. Also discontinued Namenda since nausea can be a major side effect. 4. Use Valium for agitation or insomnia. 5. Currently receiving IV morphine PRN for pain.  Patient agreeable to [place] referral.  50 minutes. Greater than 50%  of this time was spent counseling and coordinating care related to the above assessment and plan.   Jaclyn Hacker, DO Palliative Medicine

## 2013-06-29 NOTE — Progress Notes (Signed)
[facility] PROGRESS NOTE  Jaclyn [name] WUJ:[id number] DOB: [birth year] DOA: 06/24/2013 PCP: [name], MD  Brief narrative: 73 y.o. female with a past medical history of endometrial cancer diagnosed in 2011, status post debulking, status post chemotherapy, multiple paracentesis for malignant ascites/peritoneal carcinomatosis; borderline diabetes, depression and hypertension who presented to [facility] ED 06/24/2013 with progressive abdominal swelling and bloating associated with N/V worsening over the past 2-3 days prior to this admission.  In ED, her vitals are stable. CT abd revealed no evidence of small bowel obstruction but there was a significant increased abdominal and pelvic ascites. She underwent paracentesis 4/5 with 4.5 L fluid drained and 4/7 with 2.2 L fluids drained. N/V improved as of 4/8, but patient thinks she does not have enough support at home to manage. Will discuss with SW possibility of ALF/SNF. Also possibly residential hospice as she has decided she no longer wants to pursue treatment for her ovarian cancer.  Assessment/Plan:   Abdominal pain, nausea, vomiting, malignant ascites, ovarian ca, peritoneal carcinomatosis  Status post paracentesis 4/5 and 4/7. This was done per IR and 4.5 L and 2.2 L fluid drained respectively. Nausea and vomiting relieved with zofran PRN. We will continue to advance diet as tolerated.    Ovarian cancer and Peritoneal carcinomatosis  Continue Letrozole per Oncology (Dr. Hans Eden). Appreciate oncology following. Patient no longer wants to pursue treatment for her cancer and would like to pursue hospice. Home hospice is not plausible given lack of family support. So residential hospice vs SNF at this point. Palliative consult has been requested.  Anemia of chronic disease  Secondary to history of malignancy. Hemoglobin 9.7. No current indications for transfusion.   Depression  Continue sertraline.  Moderate protein calorie malnutrition   Nutrition consulted. Resource breeze.  Code Status: full code  Family Communication: no family at the bedside  Disposition Plan: Residential hospice vs SNF.  Consultants:  IR Oncology (Dr. Hans Eden)  Procedures:  Paracentesis 06/25/2013 and 06/27/2013 Antibiotics:  None    Erline Hau, MD  [facility] Pager [phone]  If 7PM-7AM, please contact night-coverage [email or web address] Password [email or web address] 06/29/2013, 3:01 PM   LOS: 5 days    HPI/Subjective: Still feels weak. N/v have resolved.  Objective: Filed Vitals:   06/28/13 1501 06/28/13 2140 06/29/13 0609 06/29/13 1359  BP: 127/62 102/63 112/66 123/65  Pulse: 79 99 84 99  Temp: 93.4 F (34.1 C) 99.2 F (37.3 C) 97.8 F (36.6 C) 98.3 F (36.8 C)  TempSrc: Oral Oral Oral Oral  Resp: 19 18 18 19   Height:      Weight:      SpO2: 91% 97% 100% 99%    Intake/Output Summary (Last 24 hours) at 06/29/13 1501 Last data filed at 06/29/13 1306  Gross per 24 hour  Intake    120 ml  Output    900 ml  Net   -780 ml    Exam:   General:  Pt is alert, follows commands appropriately, not in acute distress  Cardiovascular: Regular rate and rhythm, S1/S2, no murmurs, no rubs, no gallops  Respiratory: Clear to auscultation bilaterally, no wheezing, no crackles, no rhonchi  Abdomen: distended, bowel sounds present, no guarding  Extremities: No edema, pulses DP and PT palpable bilaterally  Neuro: Grossly nonfocal  Data Reviewed: Basic Metabolic Panel:  Recent Labs Lab 06/24/13 1416 06/24/13 1826 06/25/13 0530 06/26/13 0510  NA 139 135* 136* 139  K 4.4 4.2 4.5 4.5  CL 105 101 103 106  CO2  --  22 24 20   GLUCOSE 95 82 78 69*  BUN 41* 37* 33* 25*  CREATININE 1.40* 1.07 0.91 0.88  CALCIUM  --  9.0 8.6 8.9   Liver Function Tests:  Recent Labs Lab 06/24/13 1826 06/25/13 0530 06/26/13 0510  AST 77* 45* 16  ALT 30 31 17   ALKPHOS 75 81 68  BILITOT 0.4 <0.2* 0.3  PROT 7.1 6.4 6.3  ALBUMIN 2.3* 2.1*  1.9*    Recent Labs Lab 06/24/13 1826  LIPASE 43   No results found for this basename: AMMONIA,  in the last 168 hours CBC:  Recent Labs Lab 06/24/13 1416 06/24/13 1826 06/25/13 0530 06/26/13 0510 06/27/13 0610  WBC  --  10.4 9.9 13.0* 12.7*  NEUTROABS  --  7.7  --   --   --   HGB 12.2 9.2* 8.7* 9.7* 9.8*  HCT 36.0 27.1* 27.2* 31.1* 30.1*  MCV  --  82.9 82.7 85.0 83.6  PLT  --  374 372 360 361   Cardiac Enzymes: No results found for this basename: CKTOTAL, CKMB, CKMBINDEX, TROPONINI,  in the last 168 hours BNP: No components found with this basename: POCBNP,  CBG:  Recent Labs Lab 06/26/13 1052 06/26/13 1123  GLUCAP 66* 130*    BODY FLUID CULTURE     Status: None   Collection Time    06/24/13 10:38 PM      Result Value Ref Range Status   Specimen Description PERITONEAL CAVITY   Final   Value: NO GROWTH 2 DAYS     Performed at [facility]   Report Status PENDING   Incomplete  URINE CULTURE     Status: None   Collection Time    06/26/13 11:25 AM      Result Value Ref Range Status   Specimen Description URINE, RANDOM   Final   Value: Multiple bacterial morphotypes present, none predominant. Suggest appropriate recollection if clinically indicated.     Performed at [facility]   Report Status 06/27/2013 FINAL   Final     Studies: No results found.  Scheduled Meds: . feeding supplement (RESOURCE BREEZE)  1 Container Oral BID WC  . heparin  5,000 Units Subcutaneous 3 times per day  . Lurasidone HCl  30 mg Oral Q breakfast  . Memantine HCl ER  28 mg Oral Daily  . pantoprazole (PROTONIX) IV  40 mg Intravenous Q12H  . senna-docusate  2 tablet Oral BID  . sertraline  25 mg Oral Daily   Continuous Infusions: . dextrose 5 % and 0.9% NaCl 1,000 mL with potassium chloride 20 mEq infusion 50 mL/hr at 06/29/13 1323

## 2013-06-29 NOTE — Progress Notes (Signed)
CSW received notification that pt has transitioned to comfort care and awaiting PMT [place] meeting.  Per attending MD, attending MD feel that pt would benefit from residential hospice.  CSW met with pt and pt pastor/HCPOA at bedside. Pt confirmed that she want to be comfortable. CSW discussed residential hospice and clarified pt questions about residential hospice versus home with hospice versus SNF with palliative care following. Pt continues to state that she does not feel that she would be able to return home upon discharge even with the support of hospice services.  Pt stated that she would like to speak with palliative care MD and Dr. Marko Plume before CSW initiates any referral to residential hospice.  CSW provided support as pt expressed that she often is not able to remember everything from every conversation that she has and CSW ensured pt that CSW would be willing to answer any questions and go over any information again as CSW wants to make sure that pt has all the information in order to make an informed decision. Pt expressed appreciation and does state that she knows that she wants to be comfortable.  Alison Murray, MSW, [employer] [phone]

## 2013-06-29 NOTE — Progress Notes (Signed)
OT Cancellation Note  Patient Details Name: Jaclyn [name] MRN: [id number] DOB: 1940/06/18   Cancelled Treatment:    Reason Eval/Treat Not Completed: Other (comment). Noted that pt is considering residential hospice.  Talked to [place] from SW and she will let me know if OT eval is needed (if snf will be pursed).    [name] 06/29/2013, 11:16 AM [name], OTR/L [phone] 06/29/2013

## 2013-06-29 NOTE — Progress Notes (Signed)
06/29/2013, 10:09 AM  Hospital day 6 Antibiotics: none Chemotherapy: last Aug 2014    Subjective: More comfortable with present pain and nausea medication. Abdomen still sore, but not worsening, and no recurrent ascites apparent since last paracentesis on 06-27-13.Marland Kitchen Eating ice chips, no vomiting. No bowel movement (did have gas last pm). No SOB at rest. Burning with urination  Objective: Vital signs in last 24 hours: Blood pressure 112/66, pulse 84, temperature 97.8 F (36.6 C), temperature source Oral, resp. rate 18, height 5\' 3"  (1.6 m), weight 171 lb 4.8 oz (77.7 kg), SpO2 100.00%. IVF at 50 cc/hr via PAC.  Intake/Output from previous day: 04/08 0701 - 04/09 0700 In: 240 [P.O.:240] Out: 650 [Urine:650] Intake/Output this shift: Total I/O In: -  Out: 300 [Urine:300]  Physical exam: awake, alert, looks more comfortable than last pm Respirations not labored in bed on RA. Oral mucosa moist. Lungs clear anteriorly and laterally PAC site ok. Heart RRR, no gallop. Abdomen no longer distended, a few BS LUQ, soft, not tender to gentle exam. LE no pitting edema, cords, tenderness. Moves all extremities.  Lab Results:  Recent Labs  06/27/13 0610  WBC 12.7*  HGB 9.8*  HCT 30.1*  PLT 361   BMET No results found for this basename: NA, K, CL, CO2, GLUCOSE, BUN, CREATININE, CALCIUM,  in the last 72 hours  Urine culture 4-6 with multiple species.  Studies/Results: US Paracentesis  06/27/2013   CLINICAL DATA:  Recurrent ascites, request for paracentesis.  EXAM: ULTRASOUND GUIDED diagnostic and therapeutic PARACENTESIS  COMPARISON:  None.  PROCEDURE: An ultrasound guided paracentesis was thoroughly discussed with the patient and questions answered. The benefits, risks, alternatives and complications were also discussed. The patient understands and wishes to proceed with the procedure. Written consent was obtained.  Ultrasound was performed to localize and mark an adequate pocket of fluid in  the left lower quadrant of the abdomen. The area was then prepped and draped in the normal sterile fashion. 1% Lidocaine was used for local anesthesia. Under ultrasound guidance a 19 gauge Yueh catheter was introduced. Paracentesis was performed. The catheter was removed and a dressing applied.  Complications: None.  FINDINGS: A total of approximately 2.2 liters of cloudy yellow fluid was removed. A fluid sample was sent for laboratory analysis.  IMPRESSION: Successful ultrasound guided paracentesis yielding 2.2 liters of ascites.  Read By:  Tsosie Billing PA-C   Electronically Signed   By: Daryll Brod M.D.   On: 06/27/2013 15:47     CYTOLOGY Accession: [id number] Received: 06/27/2013  REPORT Adequacy Reason Satisfactory For Evaluation. Diagnosis PERITONEAL/ASCITIC FLUID (SPECIMEN 1 OF 1 COLLECTED 06/27/13): METASTATIC ADENOCARCINOMA. EXTENSIVE ACUTE INFLAMMATION.YTOLOGY    DISCUSSION:  Patient is very clear again today that she wants no further chemotherapy, but is willing to consider Hospice assistance. She tells me that she cannot tolerate being at home with daughters  "no peace there" "they want me to eat things that I don't want", does not think this manageable even if she had Hospice staff checking on her and assisting with care. I do not know what information she has gotten re nursing facilities.  No family here presently; one of her pastors was here this AM.  Assessment/Plan: 1.progressive ovarian cancer with large volume malignant ascites developing since I saw her last in Feb: her disease has been responsive to sequential treatments since diagnosis 4 years ago and may well still be responsive to chemo, however patient does not want any further treatment. Palliative Care consult/ possible  Hospice assistance requested. GI symptoms suggest at least partial bowel obstruction, being managed conservatively. Pain and nausea better with present medications. 2.DNR, patient's request. Living Will  and HCPOA in place.  3.multifactorial anemia: last labs generally stable. Would not follow regular labs now  4.history of anxiety/ depression, followed by psychiatry x years  5.HTN, known to Dr Debara Pickett 6.no UTI by culture this admission 7.disposition still needs to be decided, as patient is refusing to return home with daughters. Hopefully Hospice will be involved after discharge.   Jaclyn [name] [phone]

## 2013-06-29 NOTE — Progress Notes (Signed)
CSW reviewed chart and noted PMT GOC that pt agreeable to residential hospice placement.   CSW met with pt at bedside to discuss.   Pt confirmed that she was able to meet with Dr. Hilma Favors and pt feels residential hospice is best plan for her given current home situation.   CSW re-reviewed residential hospice list with pt and pt chooses bed at [facility].  CSW notified [facility] liaison, Erling Conte of referral.   CSW to continue to follow to assist with residential hospice placement.  Alison Murray, MSW, [place] Work [phone]

## 2013-06-30 DIAGNOSIS — E440 Moderate protein-calorie malnutrition: Secondary | ICD-10-CM

## 2013-06-30 DIAGNOSIS — R112 Nausea with vomiting, unspecified: Secondary | ICD-10-CM

## 2013-06-30 MED ORDER — ALPRAZOLAM 1 MG PO TABS: 1.0000 mg | ORAL_TABLET | ORAL | Status: AC | PRN

## 2013-06-30 MED ORDER — HYDROCODONE-ACETAMINOPHEN 5-325 MG PO TABS: 1.0000 | ORAL_TABLET | ORAL | Status: AC | PRN

## 2013-06-30 NOTE — Progress Notes (Signed)
Clinical Social Work Department CLINICAL SOCIAL WORK PLACEMENT NOTE 06/30/2013  Patient:  Jaclyn [name], Jaclyn [name]  Account Number:  [id number] Admit date:  06/24/2013  Clinical Social Worker:  Ulyess Blossom  Date/time:  06/28/2013 02:42 PM  Clinical Social Work is seeking post-discharge placement for this patient at the following level of care:   SKILLED NURSING   (*CSW will update this form in Epic as items are completed)   06/28/2013  Patient/family provided with [place] Department of Clinical Social Work's list of facilities offering this level of care within the geographic area requested by the patient (or if unable, by the patient's family).  06/28/2013  Patient/family informed of their freedom to choose among providers that offer the needed level of care, that participate in Medicare, Medicaid or managed care program needed by the patient, have an available bed and are willing to accept the patient.  06/28/2013  Patient/family informed of [facility]' ownership interest in [facility], as well as of the fact that they are under no obligation to receive care at this facility.  PASARR submitted to EDS on 06/28/2013 PASARR number received from EDS on 06/28/2013  FL2 transmitted to all facilities in geographic area requested by pt/family on  06/28/2013 FL2 transmitted to all facilities within larger geographic area on   Patient informed that his/her managed care company has contracts with or will negotiate with  certain facilities, including the following:     Patient/family informed of bed offers received:   Patient chooses bed at [facility] Physician recommends and patient chooses bed at    Patient to be transferred to  on  [facility] Patient to be transferred to facility by [facility] on 06/30/2013  The following physician request were entered in Epic:   Additional Comments:

## 2013-06-30 NOTE — Consult Note (Signed)
[facility] Liaison: Received request for patient interest in [facility]. Chart reviewed. Clinical information to be reviewed by [facility] Physician. Will update CSW re eligibility/availability. Thank you. Erling Conte LCSW [phone]

## 2013-06-30 NOTE — Consult Note (Signed)
[facility] Liaison: [employer] available for today. Patient agreeable and completed admission paperwork. Spoke with Dr. Marko Plume by phone to confirm she will attend with symptom management by [facility] physician. Spoke with patient's daughter Janace Hoard by phone per patient request. Left VM for daughter [name] per patient request. Spoke with HCPOA/Pastor Thomes Cake. Doristine Bosworth and family to visit patient this afternoon at [facility]. RN and CSW aware. Please fax discharge summary to [phone] and have RN call report to [phone]. Please arrange transport for patient to arrive by noon. Thank you. Erling Conte LCSW [phone]

## 2013-06-30 NOTE — Progress Notes (Signed)
Medical Oncology   I spoke with Jaclyn [name] this AM, as [name] bed is available today; I am glad to be attending for Hospice, with Hospice physicians also helping with symptom management.  Patient seen now, with daughter [name] and Dr Jerilee Hoh here. Patient is pleased about plan for [facility], tho obviously a little anxious/ seems more agitated now. She reports vomiting again this AM, which likely will continue intermittently. Vitals stable, afebrile, abdomen not more distended, soft.  I doubt she needs steroids continued.  Daughter plans to see her at [facility] this evening, and expresses appreciation for care.  Thank you [name], MD  [phone]

## 2013-06-30 NOTE — Progress Notes (Signed)
CSW received notification from [facility], Erling Conte that pt has bed available at [facility] today.  CSW facilitated pt discharge needs including discussing with [facility] liaison, Erling Conte, faxing pt discharge information to facility, providing RN phone number to call report, and discussing with pt at bedside. Pt stated that pt daughter had just left hospital and did not feel CSW needed to contact anyone else concerning discharge to [facility]. CSW arranged ambulance transport via [facility].  No further social work needs identified at this time.  CSW signing off.   Alison Murray, MSW, [place] Work [phone]

## 2013-06-30 NOTE — Discharge Summary (Signed)
Physician Discharge Summary  Jaclyn [name]:[id number] DOB: 12/05/1940 DOA: 06/24/2013  PCP: [name], MD  Admit date: 06/24/2013 Discharge date: 06/30/2013  Time spent: 45 minutes  Recommendations for Outpatient Follow-up:  -Will be discharged to [facility] today   Discharge Diagnoses:  Principal Problem:   Abdominal pain Active Problems:   Ovarian cancer   Peritoneal carcinomatosis   Depression   Antineoplastic chemotherapy induced anemia(285.3)   HTN (hypertension)   Malignant ascites   Nausea and vomiting   Hematemesis   Dehydration   Elevated SGOT   Malnutrition of moderate degree   Nausea with vomiting   Extrapyramidal dysarthria   Discharge Condition: Stable  Filed Weights   06/25/13 0046 06/26/13 0458 06/27/13 0533  Weight: 80 kg (176 lb 5.9 oz) 77.3 kg (170 lb 6.7 oz) 77.7 kg (171 lb 4.8 oz)    History of present illness:  Jaclyn [name] is a 73 y.o. 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 She was recently hospitalized from 05/25/2013 through 05/29/2013 for diarrhea/nausea/vomiting/UTI/acute renal failure. She says that during the hospitalization in March, she had abdominal distention and generalized abdominal pain. Since that time, she has had progressive abdominal swelling and bloating which has worsened over the past 2-3 days. She has global generalized abdominal pain which starts at the left lower quadrant and radiates to most of her abdomen. She describes the pain as a pulling type of pain and it has been a 9-10 over 10 in intensity most of the time. She has had nausea and vomiting over the past 2-3 days with at least 7 episodes of emesis over the past 24 hours. Once she had coffee grounds emesis. She has early satiety  when she attempts to eat or drink. She denies subjective fever, chills, or night sweats. She has occasional shortness of breath with activity. She denies any worsening swelling in her legs. She occasionally has discomfort with urination. Her last bowel movement was 4 days ago after having several bouts of loose stools. She denies black tarry stools and bright red blood per rectum.  In the emergency department, she is afebrile and hemodynamically stable. Her lab data are significant for a hemoglobin of 9.2 (following a couple of liters of IV fluids), sodium of 135, BUN of 37, normal lipase and isolated elevated AST of 77. Her urinalysis is unremarkable. CT of the abdomen and pelvis without contrast reveals no evidence of small bowel obstruction; significant increased abdominal and pelvic ascites; no free air; no hydronephrosis; status post cholecystectomy and hysterectomy. She is being admitted for further evaluation and management.  Of note, ED physician Dr. Doy Mince performed a diagnostic paracentesis. The fluid was sent for studies.   Hospital Course:   Abdominal pain, nausea, vomiting, malignant ascites, ovarian ca, peritoneal carcinomatosis  Status post paracentesis 4/5 and 4/7. This was done per IR and 4.5 L and 2.2 L fluid drained respectively. Nausea and vomiting improved with zofran and phenergan. We will continue to advance diet as tolerated. To [facility] for EOL care and symptom management.  Ovarian cancer and Peritoneal carcinomatosis  Continue Letrozole per Oncology (Dr. Hans Eden). Appreciate oncology following. Patient no longer wants to pursue treatment for her cancer and would like to pursue hospice. Home hospice is not plausible given lack of family support.  To residential hospice ([facility]) today.  Anemia of chronic  disease  Secondary to history of malignancy. Hemoglobin 9.7. No current indications for transfusion.   Depression  Continue sertraline.   Moderate protein  calorie malnutrition  Nutrition consulted. Resource breeze.   Procedures:  Paracentesis x 2   Consultations:  Oncology  Palliative Care  Discharge Instructions  Discharge Orders   Future Appointments Provider Department Dept Phone   07/10/2013 1:45 PM Wallene Huh, [place] [facility] [place]   07/13/2013 10:30 AM Janie Morning, MD [facility] [phone]   07/17/2013 12:00 PM [facility] [phone]   07/17/2013 12:30 PM [facility] Nurse [facility] [phone]   07/17/2013 1:00 PM Gordy Levan, MD [place] [phone]   09/08/2013 1:15 PM [facility] [place] [facility] [phone]   09/08/2013 1:30 PM [facility] Nurse [place] [place] [facility] [phone]   09/08/2013 2:00 PM Gordy Levan, MD [facility] [phone]   Future Orders Complete By Expires   Discontinue IV  As directed    Increase activity slowly  As directed        Medication List    STOP taking these medications       aspirin 81 MG tablet     diphenoxylate-atropine 2.5-0.025 MG per tablet  Commonly known as:  LOMOTIL     ferrous gluconate 240 (27 FE) MG tablet  Commonly known as:  FERGON     letrozole 2.5 MG tablet  Commonly known as:  FEMARA     lidocaine-prilocaine cream  Commonly known as:  EMLA     losartan-hydrochlorothiazide 100-12.5 MG per tablet  Commonly known as:  HYZAAR     lurasidone 40 MG Tabs tablet  Commonly known as:  LATUDA     NAMENDA XR 28 MG Cp24  Generic drug:  Memantine HCl ER     naproxen sodium 220 MG tablet  Commonly known as:  ANAPROX     potassium chloride SA 20 MEQ tablet  Commonly known as:  K-DUR,KLOR-CON      TAKE these medications       ALPRAZolam 1 MG tablet  Commonly known as:   XANAX  Take 1 tablet (1 mg total) by mouth every 2 (two) hours as needed for anxiety.     ezetimibe-simvastatin 10-40 MG per tablet  Commonly known as:  VYTORIN  Take 1 tablet by mouth daily.     HYDROcodone-acetaminophen 5-325 MG per tablet  Commonly known as:  NORCO/VICODIN  Take 1-2 tablets by mouth every 4 (four) hours as needed for moderate pain.     omeprazole 20 MG capsule  Commonly known as:  PRILOSEC  Take 20 mg by mouth 2 (two) times daily before a meal.     ondansetron 8 MG tablet  Commonly known as:  ZOFRAN  8 mg tablets, take 1-2 tablets every 12 hours as needed for nausea     sertraline 25 MG tablet  Commonly known as:  ZOLOFT  Take 25 mg by mouth daily.       Allergies  Allergen Reactions  . Fish Allergy     With mercury   . Lorazepam     " INTOLERANT-2 DAUGHTERS"  . Sulfa Antibiotics     Pt unable to remember reaction      The results of significant diagnostics from this hospitalization (including imaging, microbiology, ancillary and laboratory)  are listed below for reference.    Significant Diagnostic Studies: Ct Abdomen Pelvis Wo Contrast  06/24/2013   CLINICAL DATA:  Abdominal pain, nausea, vomiting, recent bowel obstruction, ovarian cancer  EXAM: CT ABDOMEN AND PELVIS WITHOUT CONTRAST  TECHNIQUE: Multidetector CT imaging of the abdomen and pelvis was performed following the standard protocol without intravenous contrast.  COMPARISON:  05/25/2013  FINDINGS: Lung bases are unremarkable. Sagittal images of the spine shows disc space flattening with vacuum disc phenomenon at L5-S1 level. There is significant increase in abdominal ascites. The patient is status postcholecystectomy. Abundant perihepatic and perisplenic ascites. Abundant ascites noted bilateral paracolic gutters. Atherosclerotic calcifications of abdominal aorta and iliac arteries. Unenhanced liver pancreas, spleen and adrenals are unremarkable. Unenhanced kidneys shows no nephrolithiasis. Small  calcified retroperitoneal lymph nodes are noted.  No evidence of small bowel obstruction. Some colonic stool noted. No colonic obstruction.  No free abdominal air. Significant increased pelvic ascites. The patient is status post hysterectomy. No destructive bony lesions are noted within pelvis.  IMPRESSION: 1. No evidence of small bowel obstruction.  No colonic obstruction. 2. Significant increased abdominal and pelvic ascites. 3. No free abdominal air. No nephrolithiasis. No hydronephrosis or hydroureter. 4. Status postcholecystectomy.  Status post hysterectomy.   Electronically Signed   By: Lahoma Crocker M.D.   On: 06/24/2013 16:21   US Paracentesis  06/27/2013   CLINICAL DATA:  Recurrent ascites, request for paracentesis.  EXAM: ULTRASOUND GUIDED diagnostic and therapeutic PARACENTESIS  COMPARISON:  None.  PROCEDURE: An ultrasound guided paracentesis was thoroughly discussed with the patient and questions answered. The benefits, risks, alternatives and complications were also discussed. The patient understands and wishes to proceed with the procedure. Written consent was obtained.  Ultrasound was performed to localize and mark an adequate pocket of fluid in the left lower quadrant of the abdomen. The area was then prepped and draped in the normal sterile fashion. 1% Lidocaine was used for local anesthesia. Under ultrasound guidance a 19 gauge Yueh catheter was introduced. Paracentesis was performed. The catheter was removed and a dressing applied.  Complications: None.  FINDINGS: A total of approximately 2.2 liters of cloudy yellow fluid was removed. A fluid sample was sent for laboratory analysis.  IMPRESSION: Successful ultrasound guided paracentesis yielding 2.2 liters of ascites.  Read By:  Tsosie Billing PA-C   Electronically Signed   By: Daryll Brod M.D.   On: 06/27/2013 15:47   US Paracentesis  06/27/2013   CLINICAL DATA:  Abdominal ascites  EXAM: ULTRASOUND GUIDED right lower quadrant PARACENTESIS   COMPARISON:  None  PROCEDURE: An ultrasound guided paracentesis was thoroughly discussed with the patient and questions answered. The benefits, risks, alternatives and complications were also discussed. The patient understands and wishes to proceed with the procedure. Written consent was obtained.  Ultrasound was performed to localize and mark an adequate pocket of fluid in the right lower quadrant of the abdomen. The area was then prepped and draped in the normal sterile fashion. 1% Lidocaine was used for local anesthesia. Under ultrasound guidance a 19 gauge Yueh catheter was introduced. Paracentesis was performed. The catheter was removed and a dressing applied.  Complications: None.  FINDINGS: A total of approximately 4.5 L of yellow fluid was removed. A fluid sample was not sent for laboratory analysis.  IMPRESSION: Successful ultrasound guided paracentesis yielding 4.5 L of ascites.  Read by: Jannifer [facility]   Electronically Signed   By: Daryll Brod M.D.   On: 06/26/2013 06:45   Dg  Abd Acute W/chest  06/24/2013   CLINICAL DATA:  Abdominal pain  EXAM: ACUTE ABDOMEN SERIES (ABDOMEN 2 VIEW & CHEST 1 VIEW)  COMPARISON:  05/25/2013  FINDINGS: Cardiac shadow is within normal limits. The lungs are well aerated bilaterally. A right chest wall port is seen. Mild scarring is noted in the right mid lung.  Scattered large and small bowel gas is noted. Postoperative changes are seen. No acute bony abnormality is noted.  IMPRESSION: No acute abnormality noted.   Electronically Signed   By: Inez Catalina M.D.   On: 06/24/2013 13:35    Microbiology: Recent Results (from the past 240 hour(s))  BODY FLUID CULTURE     Status: None   Collection Time    06/24/13 10:38 PM      Result Value Ref Range Status   Specimen Description PERITONEAL CAVITY   Final   Special Requests NONE   Final   Gram Stain     Final   Value: WBC PRESENT,BOTH PMN AND MONONUCLEAR     NO ORGANISMS SEEN     Gram Stain Report Called to,Read Back  By and Verified With: Gram Stain Report Called to,Read Back By and Verified With: Jerelyn Scott RN on 06/25/13 at 01:23 by Rise Mu     Performed at [facility]   Culture     Final   Value: NO GROWTH 3 DAYS     Performed at [facility]   Report Status 06/28/2013 FINAL   Final  URINE CULTURE     Status: None   Collection Time    06/26/13 11:25 AM      Result Value Ref Range Status   Specimen Description URINE, RANDOM   Final   Special Requests NONE   Final   Culture  Setup Time     Final   Value: 06/26/2013 14:19     Performed at [place]     Final   Value: >=100,000 COLONIES/ML     Performed at [facility]   Culture     Final   Value: Multiple bacterial morphotypes present, none predominant. Suggest appropriate recollection if clinically indicated.     Performed at [facility]   Report Status 06/27/2013 FINAL   Final     Labs: Basic Metabolic Panel:  Recent Labs Lab 06/24/13 1416 06/24/13 1826 06/25/13 0530 06/26/13 0510  NA 139 135* 136* 139  K 4.4 4.2 4.5 4.5  CL 105 101 103 106  CO2  --  22 24 20   GLUCOSE 95 82 78 69*  BUN 41* 37* 33* 25*  CREATININE 1.40* 1.07 0.91 0.88  CALCIUM  --  9.0 8.6 8.9   Liver Function Tests:  Recent Labs Lab 06/24/13 1826 06/25/13 0530 06/26/13 0510  AST 77* 45* 16  ALT 30 31 17   ALKPHOS 75 81 68  BILITOT 0.4 <0.2* 0.3  PROT 7.1 6.4 6.3  ALBUMIN 2.3* 2.1* 1.9*    Recent Labs Lab 06/24/13 1826  LIPASE 43   No results found for this basename: AMMONIA,  in the last 168 hours CBC:  Recent Labs Lab 06/24/13 1416 06/24/13 1826 06/25/13 0530 06/26/13 0510 06/27/13 0610  WBC  --  10.4 9.9 13.0* 12.7*  NEUTROABS  --  7.7  --   --   --   HGB 12.2 9.2* 8.7* 9.7* 9.8*  HCT 36.0 27.1* 27.2* 31.1* 30.1*  MCV  --  82.9 82.7 85.0 83.6  PLT  --  374 372 360 361   Cardiac Enzymes: No results found for this basename: CKTOTAL, CKMB, CKMBINDEX, TROPONINI,  in the  last 168 hours BNP: BNP (last 3 results) No results found for this basename: PROBNP,  in the last 8760 hours CBG:  Recent Labs Lab 06/26/13 1052 06/26/13 1123  GLUCAP 66* 130*       Signed:  Erline Hau  [facility] Pager: [phone] 06/30/2013, 10:15 AM

## 2013-06-30 NOTE — Progress Notes (Signed)
OT Cancellation Note  Patient Details Name: Jaclyn [name] MRN: [id number] DOB: 1940-08-02   Cancelled Treatment:    Reason Eval/Treat Not Completed: Other (comment).  Noted pt plans residential hospice.  Will sign off.    [name] 06/30/2013, 7:12 AM [name], OTR/L [phone] 06/30/2013

## 2013-06-30 NOTE — Progress Notes (Signed)
Physical Therapy Discharge Patient Details Name: Jaclyn [name] MRN: [id number] DOB: [birth year] Today's Date: 06/30/2013 Time:  -     Patient discharged from PT services secondary to Plans for comfort care, residential Hospice.   GP     [facility] PT [phone]  06/30/2013, 7:15 AM

## 2013-07-10 ENCOUNTER — Ambulatory Visit: Payer: Medicare Other | Admitting: Podiatry

## 2013-07-12 ENCOUNTER — Telehealth: Payer: Self-pay

## 2013-07-12 NOTE — Telephone Encounter (Signed)
Jaclyn [name] passed away at [facility] on Jul 21, 2013 at 1730.

## 2013-07-12 NOTE — Telephone Encounter (Signed)
ENCOUNTER OPENED IN ERROR

## 2013-07-13 ENCOUNTER — Ambulatory Visit: Payer: Medicare Other | Admitting: Gynecologic Oncology

## 2013-07-13 ENCOUNTER — Other Ambulatory Visit: Payer: Self-pay

## 2013-07-16 ENCOUNTER — Other Ambulatory Visit: Payer: Self-pay | Admitting: Oncology

## 2013-07-16 NOTE — Progress Notes (Signed)
Medical Oncology  Patient died at [facility] on 2-67-12, of complications of progressive gyn carcinoma. Sympathy letter written to family. Other MDs to be notified by this message. Patient and this MD very much appreciated assistance from other physicians involved.  [name], MD

## 2013-07-17 ENCOUNTER — Ambulatory Visit: Payer: Medicare Other | Admitting: Oncology

## 2013-07-17 ENCOUNTER — Other Ambulatory Visit: Payer: Medicare Other

## 2013-07-21 DEATH — deceased

## 2013-09-08 ENCOUNTER — Other Ambulatory Visit: Payer: Self-pay

## 2013-09-08 ENCOUNTER — Ambulatory Visit: Payer: Self-pay | Admitting: Oncology

## 2013-10-15 IMAGING — US US ABDOMEN LIMITED
1 series · 5 of 5 positions shown · non-contrast
Comparison: None.

CLINICAL DATA: The patient for possible therapeutic paracentesis if
there it is sufficient ascites.  This is to evaluate for amount of
ascites.

LIMITED ABDOMINAL ULTRASOUND

[Series 1: us abdomen limited · 0.35mm/px · 5 of 5 slices shown]
[im 1/5]
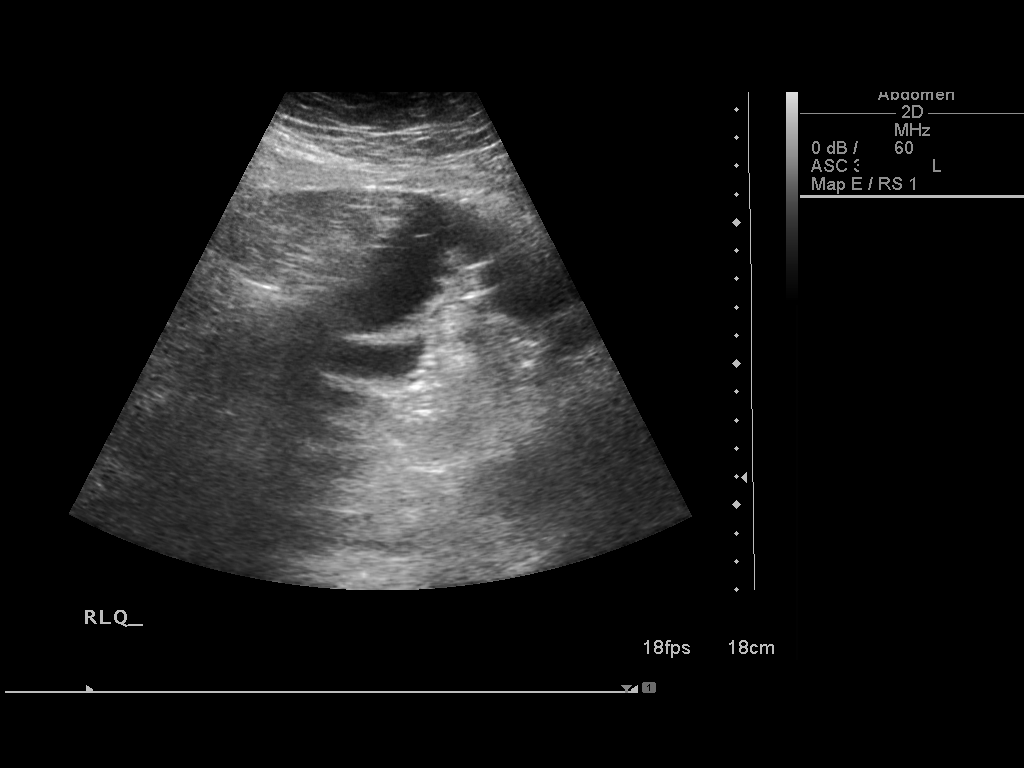
[im 2/5]
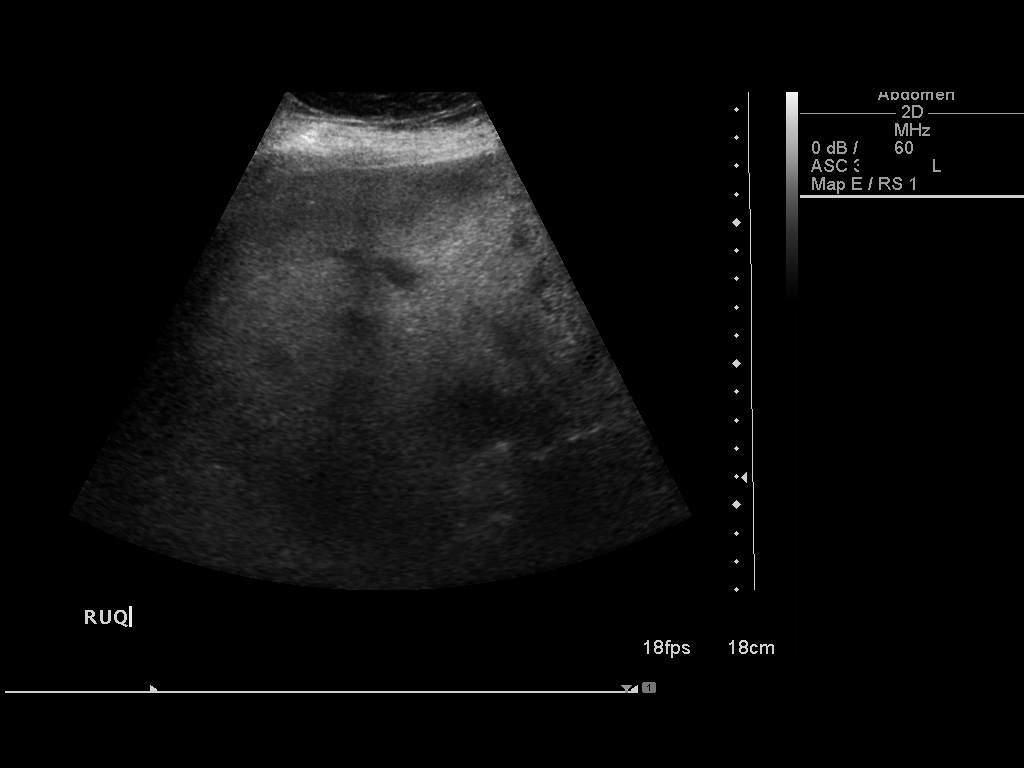
[im 3/5]
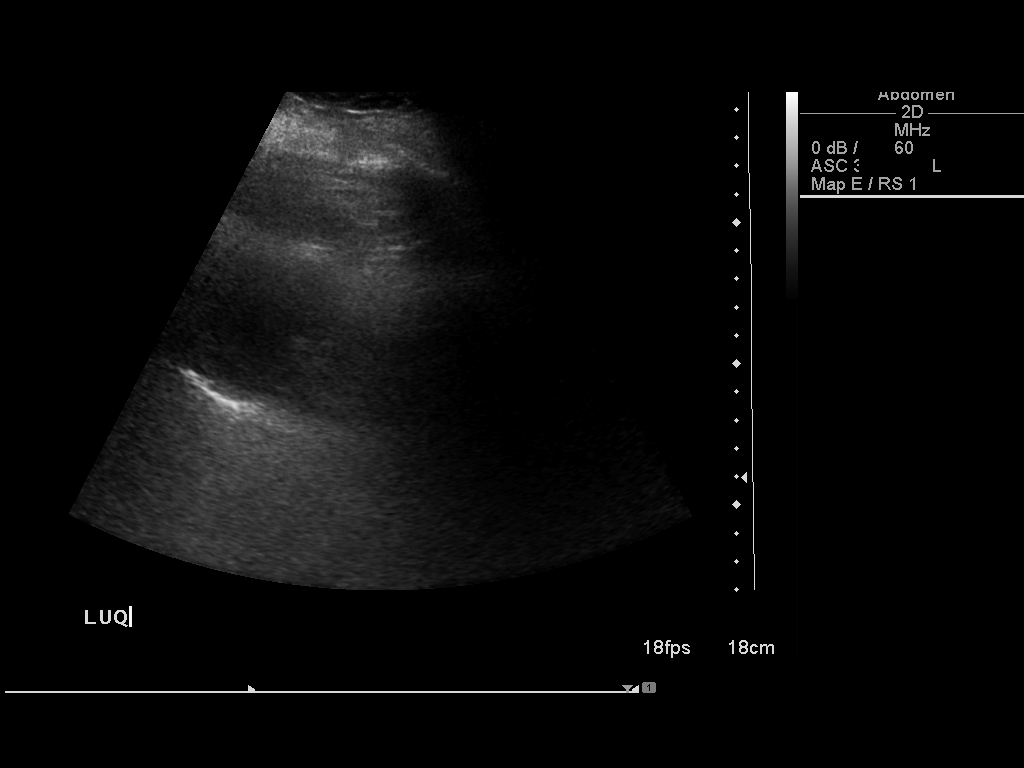
[im 4/5]
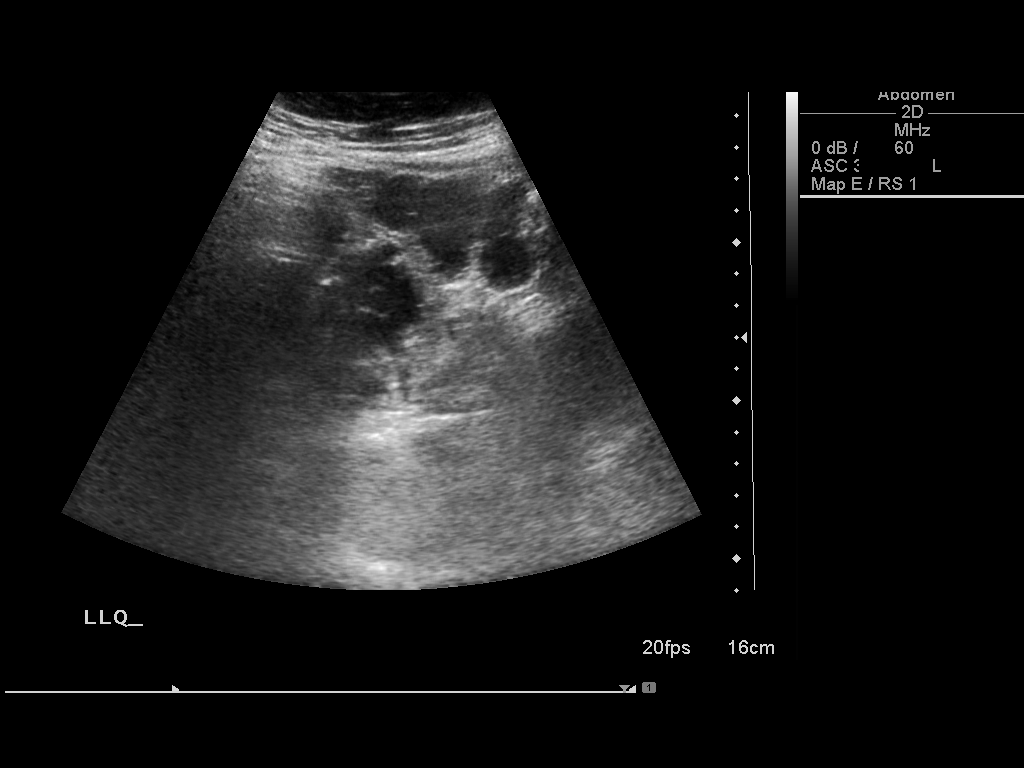
[im 5/5]
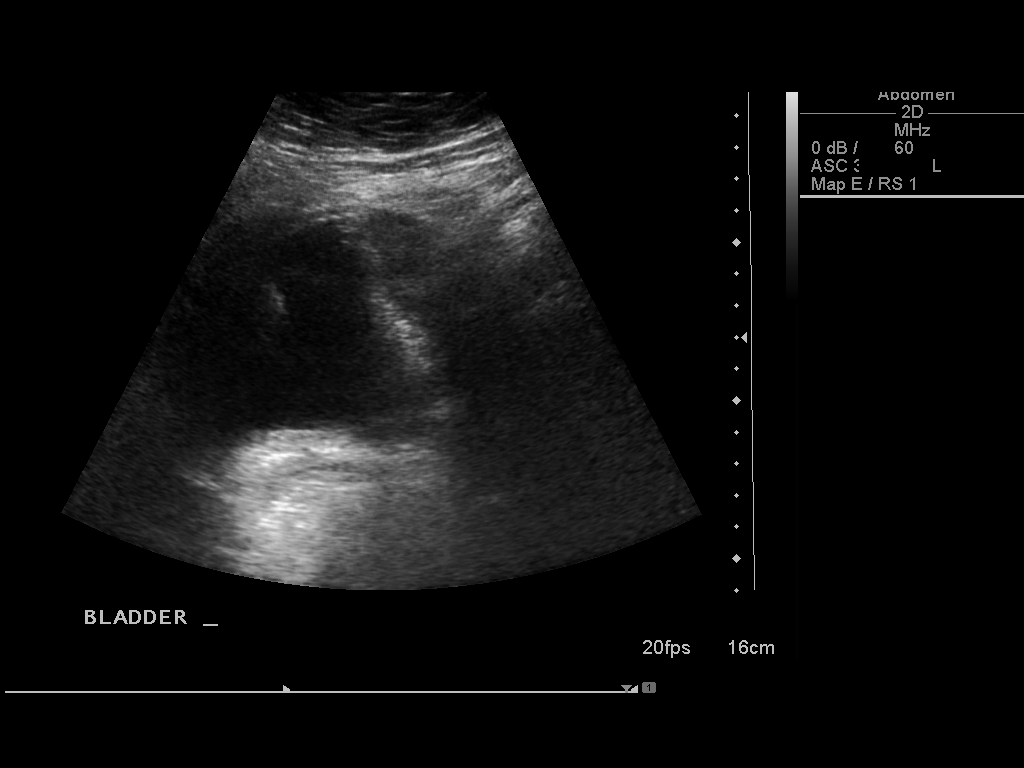

[5 of 5 positions shown; findings below may reference images not displayed]

FINDINGS: No ascites is appreciated in the peritoneal cavity.
IMPRESSION: No sonographic evidence of ascites.

## 2013-11-14 ENCOUNTER — Other Ambulatory Visit: Payer: Self-pay | Admitting: *Deleted

## 2015-03-01 IMAGING — US US PARACENTESIS
1 series · 6 of 6 positions shown · non-contrast
Comparison: None

CLINICAL DATA: Abdominal ascites

EXAM:
ULTRASOUND GUIDED right lower quadrant PARACENTESIS

[Series 1: us paracentesis · 0.27mm/px · 6 of 6 slices shown]
[im 1/6]
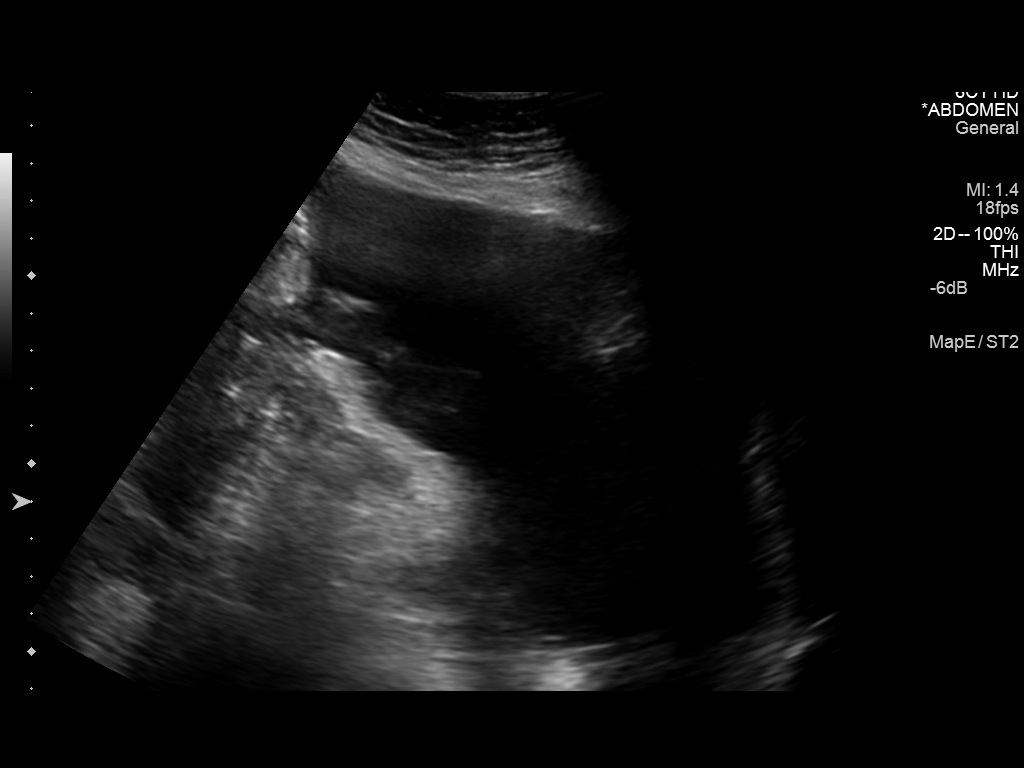
[im 2/6]
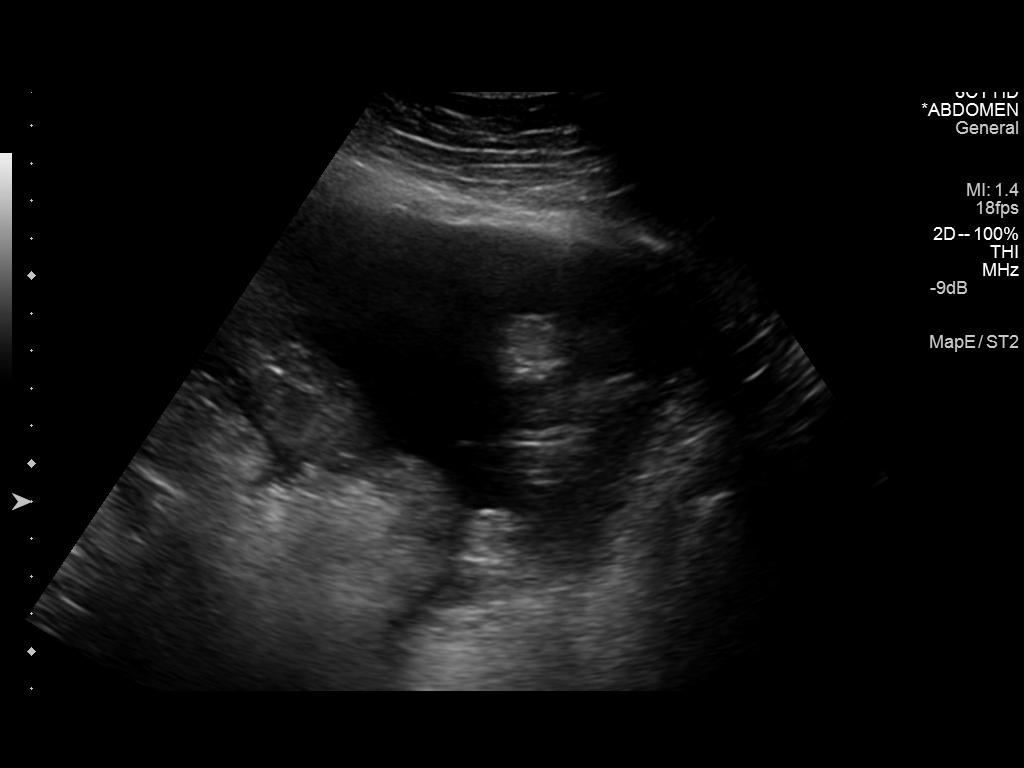
[im 3/6]
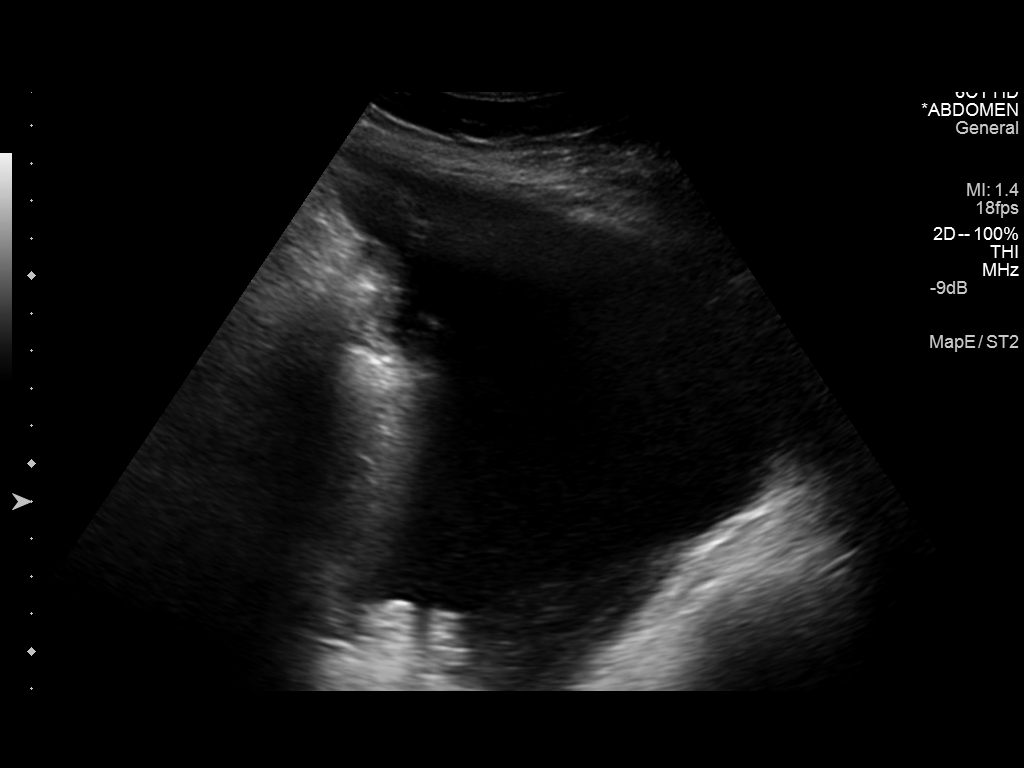
[im 4/6]
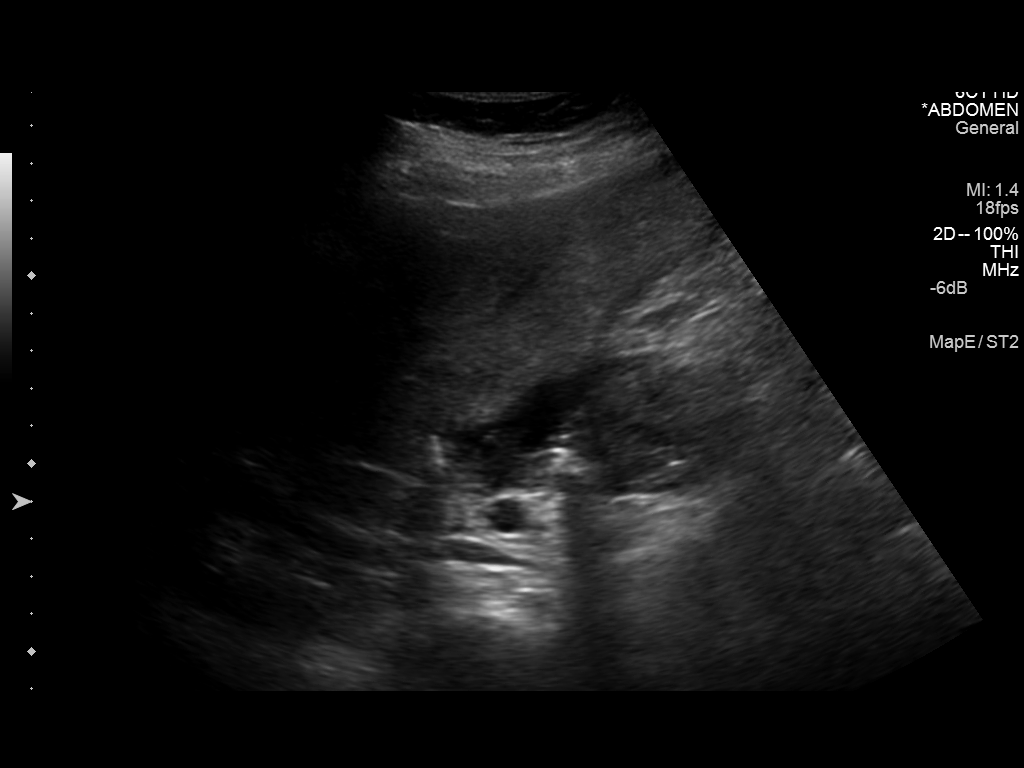
[im 5/6]
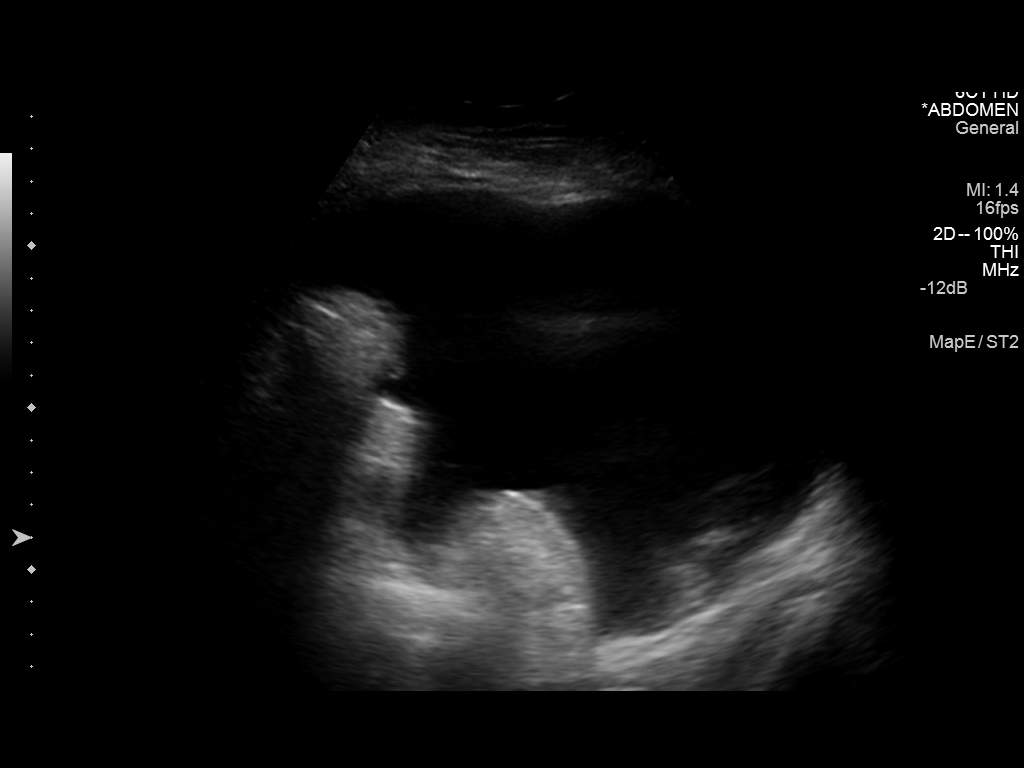
[im 6/6]
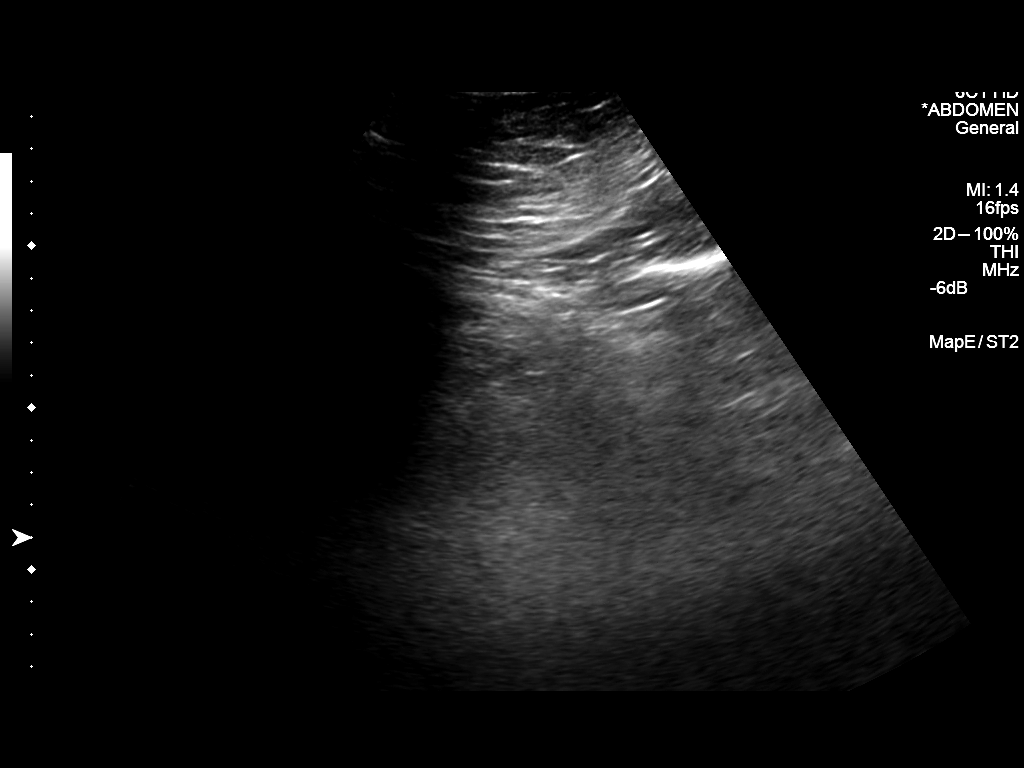

[6 of 6 positions shown; findings below may reference images not displayed]

PROCEDURE:
An ultrasound guided paracentesis was thoroughly discussed with the
patient and questions answered. The benefits, risks, alternatives
and complications were also discussed. The patient understands and
wishes to proceed with the procedure. Written consent was obtained.

Ultrasound was performed to localize and mark an adequate pocket of
fluid in the right lower quadrant of the abdomen. The area was then
prepped and draped in the normal sterile fashion. 1% Lidocaine was
used for local anesthesia. Under ultrasound guidance a 19 gauge Yueh
catheter was introduced. Paracentesis was performed. The catheter
was removed and a dressing applied.

Complications: None.
FINDINGS: A total of approximately 4.5 L of yellow fluid was removed. A fluid
sample was not sent for laboratory analysis.
IMPRESSION: Successful ultrasound guided paracentesis yielding 4.5 L of ascites.

Read by: Zivan Zika Barbulovic

## 2015-03-03 IMAGING — US US PARACENTESIS
1 series · 6 of 6 positions shown · non-contrast
Comparison: None.

CLINICAL DATA: Recurrent ascites, request for paracentesis.

EXAM:
ULTRASOUND GUIDED diagnostic and therapeutic PARACENTESIS

[Series 1: us paracentesis · 0.27mm/px · 6 of 6 slices shown]
[im 1/6]
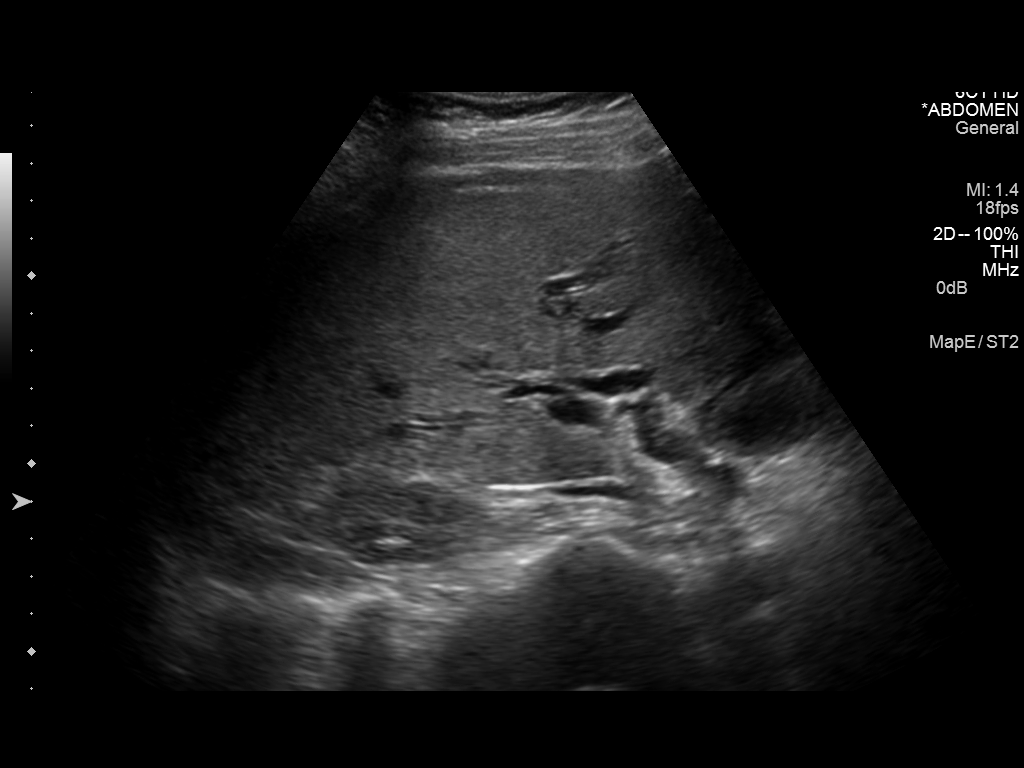
[im 2/6]
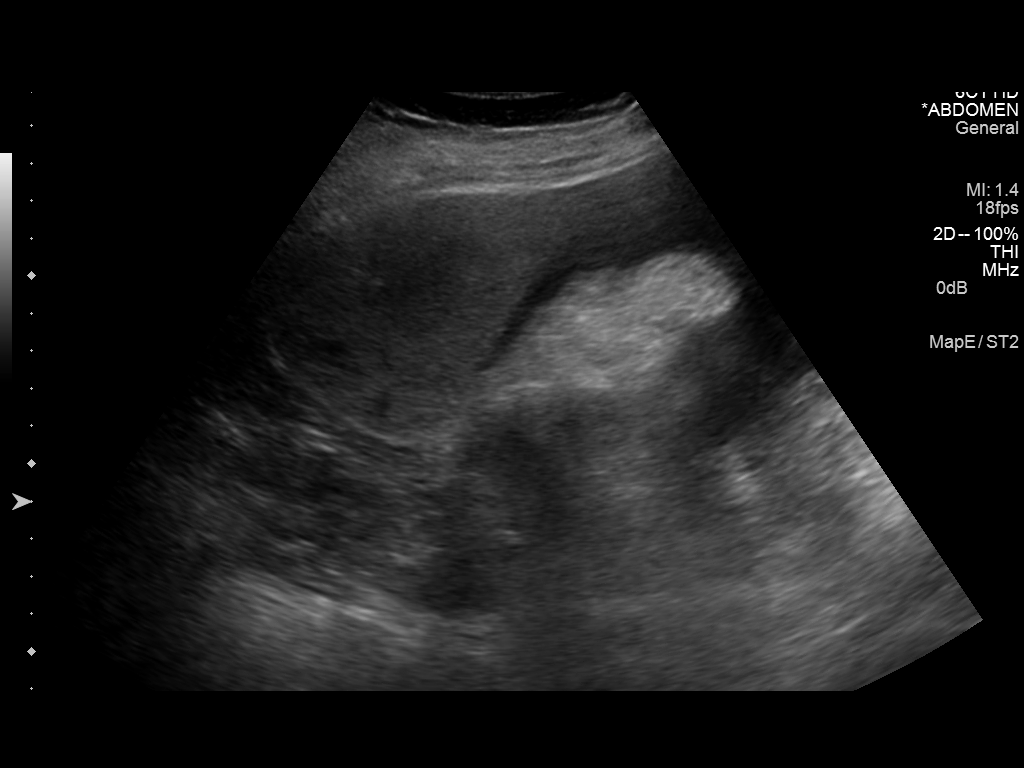
[im 3/6]
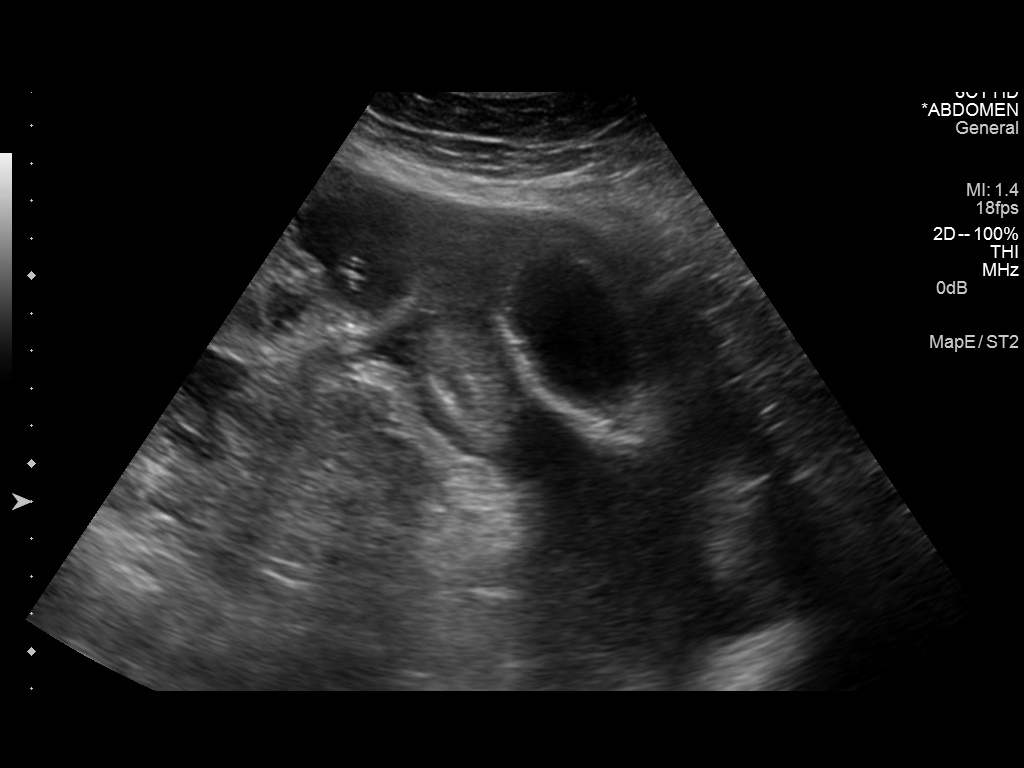
[im 4/6]
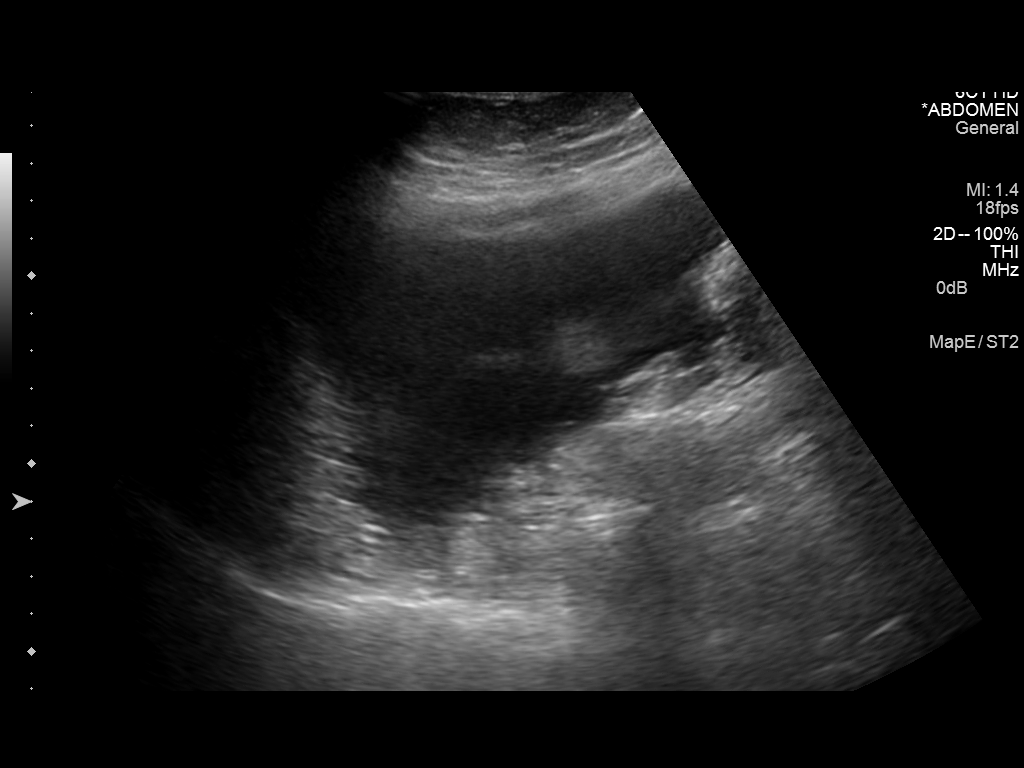
[im 5/6]
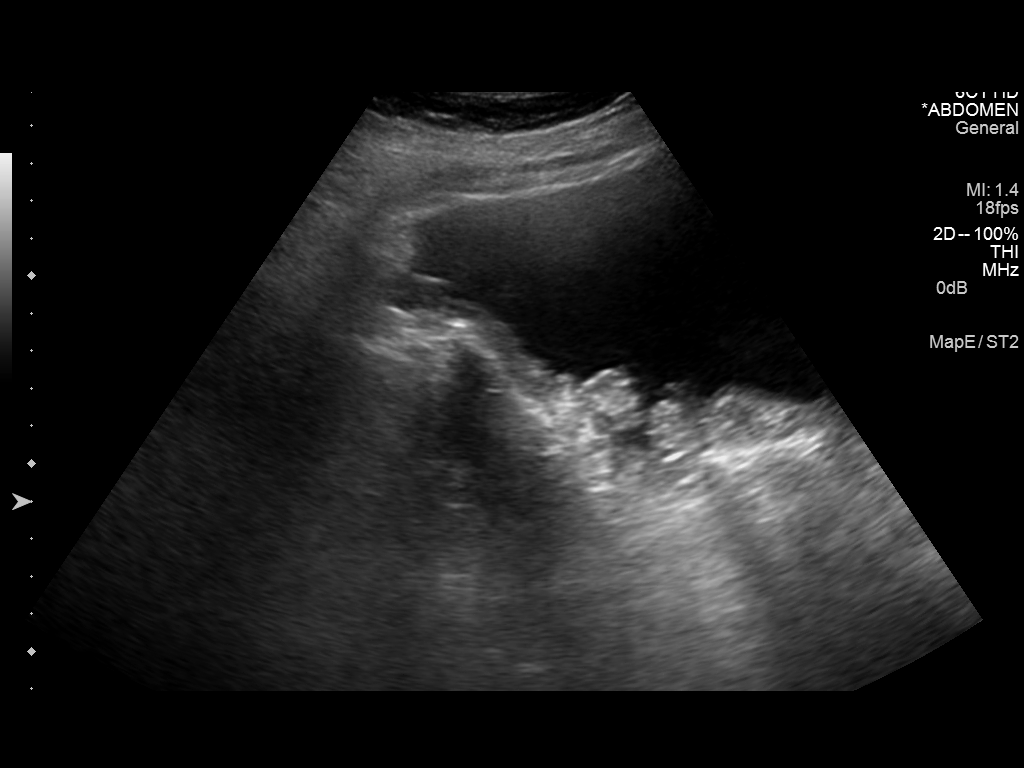
[im 6/6]
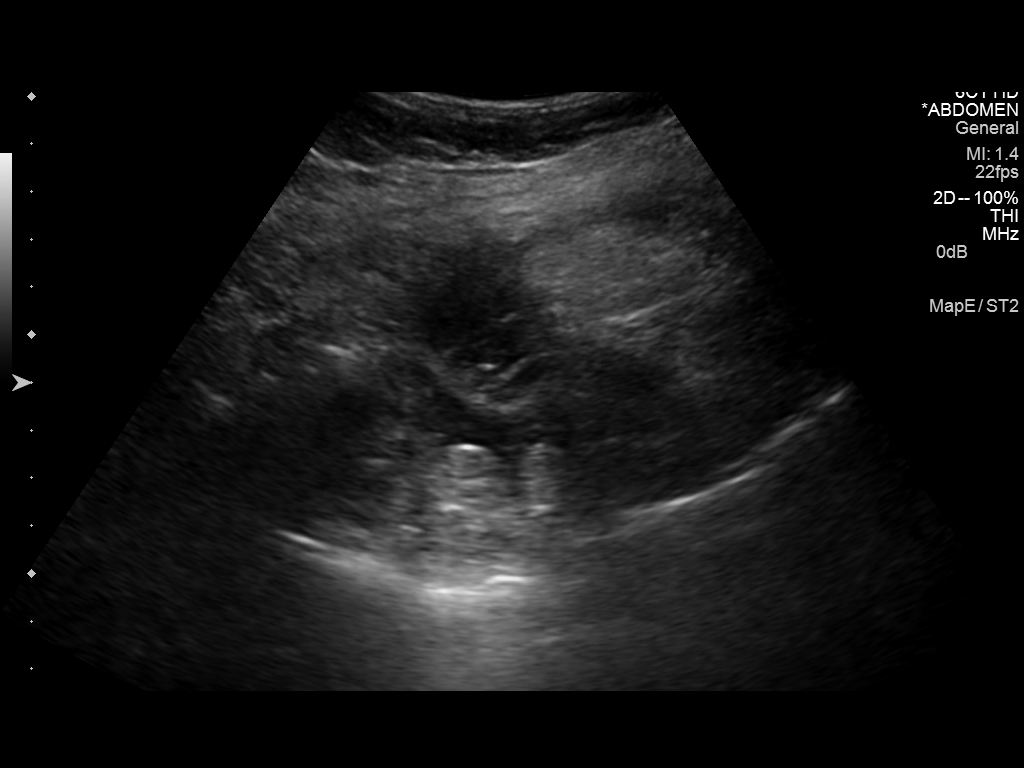

[6 of 6 positions shown; findings below may reference images not displayed]

PROCEDURE:
An ultrasound guided paracentesis was thoroughly discussed with the
patient and questions answered. The benefits, risks, alternatives
and complications were also discussed. The patient understands and
wishes to proceed with the procedure. Written consent was obtained.

Ultrasound was performed to localize and mark an adequate pocket of
fluid in the left lower quadrant of the abdomen. The area was then
prepped and draped in the normal sterile fashion. 1% Lidocaine was
used for local anesthesia. Under ultrasound guidance a 19 gauge Yueh
catheter was introduced. Paracentesis was performed. The catheter
was removed and a dressing applied.

Complications: None.
FINDINGS: A total of approximately 2.2 liters of cloudy yellow fluid was
removed. A fluid sample was sent for laboratory analysis.
IMPRESSION: Successful ultrasound guided paracentesis yielding 2.2 liters of
ascites.
